# Patient Record
Sex: Female | Born: 1950
Health system: Southern US, Community
[De-identification: ages and names within clinical notes are randomized; demographics above are authoritative.]

## PROBLEM LIST (undated history)

## (undated) DIAGNOSIS — K5790 Diverticulosis of intestine, part unspecified, without perforation or abscess without bleeding: Secondary | ICD-10-CM

## (undated) DIAGNOSIS — J309 Allergic rhinitis, unspecified: Secondary | ICD-10-CM

## (undated) DIAGNOSIS — T7840XA Allergy, unspecified, initial encounter: Secondary | ICD-10-CM

## (undated) DIAGNOSIS — N946 Dysmenorrhea, unspecified: Secondary | ICD-10-CM

## (undated) DIAGNOSIS — J45909 Unspecified asthma, uncomplicated: Secondary | ICD-10-CM

## (undated) DIAGNOSIS — E7800 Pure hypercholesterolemia, unspecified: Secondary | ICD-10-CM

## (undated) DIAGNOSIS — C50919 Malignant neoplasm of unspecified site of unspecified female breast: Secondary | ICD-10-CM

## (undated) HISTORY — DX: Pure hypercholesterolemia, unspecified: E78.00

## (undated) HISTORY — DX: Malignant neoplasm of unspecified site of unspecified female breast: C50.919

## (undated) HISTORY — DX: Allergy, unspecified, initial encounter: T78.40XA

## (undated) HISTORY — DX: Diverticulosis of intestine, part unspecified, without perforation or abscess without bleeding: K57.90

## (undated) HISTORY — DX: Dysmenorrhea, unspecified: N94.6

---

## 2001-07-01 HISTORY — PX: NASAL SINUS SURGERY: SHX719

## 2004-06-18 ENCOUNTER — Emergency Department: Payer: Self-pay | Admitting: Emergency Medicine

## 2004-12-31 ENCOUNTER — Ambulatory Visit: Payer: Self-pay | Admitting: General Practice

## 2005-09-17 ENCOUNTER — Ambulatory Visit: Payer: Self-pay | Admitting: Unknown Physician Specialty

## 2005-09-17 LAB — HM COLONOSCOPY

## 2006-01-06 ENCOUNTER — Ambulatory Visit: Payer: Self-pay | Admitting: Internal Medicine

## 2007-02-07 ENCOUNTER — Ambulatory Visit: Payer: Self-pay | Admitting: Internal Medicine

## 2007-06-20 ENCOUNTER — Ambulatory Visit: Payer: Self-pay | Admitting: Internal Medicine

## 2008-02-20 ENCOUNTER — Ambulatory Visit: Payer: Self-pay | Admitting: Pediatrics

## 2008-10-01 ENCOUNTER — Emergency Department: Payer: Self-pay | Admitting: Emergency Medicine

## 2009-05-03 HISTORY — PX: BREAST SURGERY: SHX581

## 2009-11-10 ENCOUNTER — Ambulatory Visit: Payer: Self-pay | Admitting: Internal Medicine

## 2009-11-13 ENCOUNTER — Ambulatory Visit: Payer: Self-pay | Admitting: Internal Medicine

## 2009-12-12 ENCOUNTER — Ambulatory Visit: Payer: Self-pay | Admitting: Surgery

## 2009-12-22 ENCOUNTER — Ambulatory Visit: Payer: Self-pay | Admitting: Surgery

## 2010-01-01 ENCOUNTER — Ambulatory Visit: Payer: Self-pay | Admitting: Internal Medicine

## 2010-01-07 ENCOUNTER — Ambulatory Visit: Payer: Self-pay | Admitting: Internal Medicine

## 2010-01-13 LAB — CANCER ANTIGEN 27.29: CA 27.29: 14.7 U/mL (ref 0.0–38.6)

## 2010-01-15 ENCOUNTER — Ambulatory Visit: Payer: Self-pay | Admitting: Surgery

## 2010-01-31 ENCOUNTER — Ambulatory Visit: Payer: Self-pay | Admitting: Internal Medicine

## 2010-03-03 ENCOUNTER — Ambulatory Visit: Payer: Self-pay | Admitting: Internal Medicine

## 2010-04-02 ENCOUNTER — Ambulatory Visit: Payer: Self-pay | Admitting: Internal Medicine

## 2010-05-03 ENCOUNTER — Ambulatory Visit: Payer: Self-pay | Admitting: Internal Medicine

## 2010-05-03 DIAGNOSIS — C50919 Malignant neoplasm of unspecified site of unspecified female breast: Secondary | ICD-10-CM

## 2010-05-03 HISTORY — DX: Malignant neoplasm of unspecified site of unspecified female breast: C50.919

## 2010-05-03 HISTORY — PX: BREAST BIOPSY: SHX20

## 2010-05-03 HISTORY — PX: MASTECTOMY: SHX3

## 2010-06-03 ENCOUNTER — Ambulatory Visit: Payer: Self-pay | Admitting: Internal Medicine

## 2010-07-02 ENCOUNTER — Ambulatory Visit: Payer: Self-pay | Admitting: Internal Medicine

## 2010-08-02 ENCOUNTER — Ambulatory Visit: Payer: Self-pay | Admitting: Internal Medicine

## 2010-09-01 ENCOUNTER — Ambulatory Visit: Payer: Self-pay | Admitting: Internal Medicine

## 2010-10-02 ENCOUNTER — Ambulatory Visit: Payer: Self-pay | Admitting: Internal Medicine

## 2010-10-12 LAB — HM PAP SMEAR: HM Pap smear: NEGATIVE

## 2010-11-01 ENCOUNTER — Ambulatory Visit: Payer: Self-pay | Admitting: Internal Medicine

## 2010-11-12 ENCOUNTER — Ambulatory Visit: Payer: Self-pay | Admitting: Surgery

## 2010-11-17 ENCOUNTER — Ambulatory Visit: Payer: Self-pay | Admitting: Surgery

## 2010-12-02 ENCOUNTER — Ambulatory Visit: Payer: Self-pay | Admitting: Internal Medicine

## 2010-12-21 ENCOUNTER — Ambulatory Visit: Payer: Self-pay | Admitting: Surgery

## 2010-12-22 LAB — PATHOLOGY REPORT

## 2011-01-01 ENCOUNTER — Ambulatory Visit: Payer: Self-pay | Admitting: Surgery

## 2011-01-05 ENCOUNTER — Ambulatory Visit: Payer: Self-pay | Admitting: Internal Medicine

## 2011-01-20 ENCOUNTER — Ambulatory Visit: Payer: Self-pay | Admitting: Surgery

## 2011-02-01 ENCOUNTER — Ambulatory Visit: Payer: Self-pay | Admitting: Internal Medicine

## 2011-02-02 ENCOUNTER — Ambulatory Visit: Payer: Self-pay | Admitting: Surgery

## 2011-02-04 ENCOUNTER — Ambulatory Visit: Payer: Self-pay | Admitting: Internal Medicine

## 2011-03-04 ENCOUNTER — Ambulatory Visit: Payer: Self-pay | Admitting: Internal Medicine

## 2011-03-17 ENCOUNTER — Encounter: Payer: Self-pay | Admitting: Internal Medicine

## 2011-04-03 ENCOUNTER — Ambulatory Visit: Payer: Self-pay | Admitting: Internal Medicine

## 2011-04-03 ENCOUNTER — Encounter: Payer: Self-pay | Admitting: Internal Medicine

## 2011-05-04 ENCOUNTER — Encounter: Payer: Self-pay | Admitting: Internal Medicine

## 2011-05-11 ENCOUNTER — Ambulatory Visit: Payer: Self-pay | Admitting: Internal Medicine

## 2011-06-04 ENCOUNTER — Ambulatory Visit: Payer: Self-pay | Admitting: Internal Medicine

## 2011-08-03 ENCOUNTER — Ambulatory Visit: Payer: Self-pay | Admitting: Internal Medicine

## 2011-08-03 LAB — CBC CANCER CENTER
Basophil #: 0 x10 3/mm (ref 0.0–0.1)
Basophil %: 0.6 %
Eosinophil %: 2.2 %
HCT: 35.6 % (ref 35.0–47.0)
Lymphocyte #: 1.3 x10 3/mm (ref 1.0–3.6)
MCH: 31 pg (ref 26.0–34.0)
MCV: 90 fL (ref 80–100)
Monocyte #: 0.4 x10 3/mm (ref 0.0–0.7)
Neutrophil #: 2.2 x10 3/mm (ref 1.4–6.5)
Neutrophil %: 55 %
Platelet: 199 x10 3/mm (ref 150–440)
RBC: 3.97 10*6/uL (ref 3.80–5.20)
RDW: 12.9 % (ref 11.5–14.5)

## 2011-08-03 LAB — CREATININE, SERUM
Creatinine: 1.04 mg/dL (ref 0.60–1.30)
EGFR (African American): >60
EGFR (Non-African Amer.): 57 — ABNORMAL LOW

## 2011-08-03 LAB — HEPATIC FUNCTION PANEL A (ARMC)
Alkaline Phosphatase: 93 U/L (ref 50–136)
Bilirubin, Direct: 0.1 mg/dL (ref 0.00–0.20)
SGOT(AST): 29 U/L (ref 15–37)
Total Protein: 7.5 g/dL (ref 6.4–8.2)

## 2011-09-01 ENCOUNTER — Ambulatory Visit: Payer: Self-pay | Admitting: Internal Medicine

## 2011-10-04 ENCOUNTER — Ambulatory Visit: Payer: Self-pay | Admitting: Internal Medicine

## 2011-11-03 LAB — BASIC METABOLIC PANEL: Glucose: 95 mg/dL

## 2011-11-03 LAB — LIPID PANEL: Cholesterol: 192 mg/dL (ref 0–200)

## 2011-11-03 LAB — TSH: TSH: 2.99 u[IU]/mL (ref 0.41–5.90)

## 2012-01-18 ENCOUNTER — Ambulatory Visit: Payer: Self-pay

## 2012-01-20 ENCOUNTER — Ambulatory Visit: Payer: Self-pay

## 2012-02-01 ENCOUNTER — Ambulatory Visit: Payer: Self-pay | Admitting: Internal Medicine

## 2012-03-03 ENCOUNTER — Ambulatory Visit: Payer: Self-pay | Admitting: Internal Medicine

## 2012-04-02 ENCOUNTER — Ambulatory Visit: Payer: Self-pay | Admitting: Internal Medicine

## 2012-04-17 LAB — CBC CANCER CENTER
Basophil #: 0 x10 3/mm (ref 0.0–0.1)
Basophil %: 0.7 %
Eosinophil #: 0.2 x10 3/mm (ref 0.0–0.7)
Eosinophil %: 4.6 %
HCT: 37.1 % (ref 35.0–47.0)
HGB: 12.7 g/dL (ref 12.0–16.0)
Lymphocyte #: 1.7 x10 3/mm (ref 1.0–3.6)
MCH: 31 pg (ref 26.0–34.0)
MCHC: 34.3 g/dL (ref 32.0–36.0)
MCV: 91 fL (ref 80–100)
Monocyte #: 0.4 x10 3/mm (ref 0.2–0.9)
Neutrophil #: 2.8 x10 3/mm (ref 1.4–6.5)
RBC: 4.1 10*6/uL (ref 3.80–5.20)
RDW: 13 % (ref 11.5–14.5)

## 2012-04-17 LAB — HEPATIC FUNCTION PANEL A (ARMC)
Bilirubin, Direct: 0.1 mg/dL (ref 0.00–0.20)
SGOT(AST): 24 U/L (ref 15–37)
SGPT (ALT): 34 U/L (ref 12–78)
Total Protein: 7.4 g/dL (ref 6.4–8.2)

## 2012-04-17 LAB — CREATININE, SERUM
Creatinine: 0.95 mg/dL (ref 0.60–1.30)
EGFR (Non-African Amer.): >60

## 2012-05-03 ENCOUNTER — Ambulatory Visit: Payer: Self-pay | Admitting: Internal Medicine

## 2012-06-06 ENCOUNTER — Ambulatory Visit: Payer: Self-pay | Admitting: Internal Medicine

## 2012-07-01 ENCOUNTER — Ambulatory Visit: Payer: Self-pay | Admitting: Internal Medicine

## 2012-07-20 ENCOUNTER — Ambulatory Visit: Payer: Self-pay

## 2012-08-04 ENCOUNTER — Ambulatory Visit: Payer: Self-pay | Admitting: Internal Medicine

## 2012-08-07 ENCOUNTER — Telehealth: Payer: Self-pay | Admitting: Internal Medicine

## 2012-08-07 NOTE — Telephone Encounter (Signed)
Patient having trouble with her knee. She does not know weather to go to the Oncologist or Orthopedic physician.

## 2012-08-07 NOTE — Telephone Encounter (Signed)
I can make appt with ortho if she desires.

## 2012-08-07 NOTE — Telephone Encounter (Signed)
Patient stated that when she bends and sometimes walks her knee hurts. There is some swelling on side and in the back of her knee. This has been happening for a few months.

## 2012-08-08 ENCOUNTER — Telehealth: Payer: Self-pay | Admitting: *Deleted

## 2012-08-08 NOTE — Telephone Encounter (Signed)
Pt called back and states that she would like to proceed with the referral to orthopedics.  States she had seen Dr. Malvin Johns at [facility] in the past.

## 2012-08-08 NOTE — Telephone Encounter (Signed)
I did not realize that I had not seen her here yet.  She is my pt from [facility].  I can see her as a new pt and work her in tomorrow at 12:00 or 08/21/12 at 11:15 (30 min).  I can't make a referral without seeing her.  Please notify her.  Thanks.

## 2012-08-08 NOTE — Telephone Encounter (Signed)
error 

## 2012-08-08 NOTE — Telephone Encounter (Signed)
Patient is agreeable to appointment for tomorrow at 12:00.

## 2012-08-09 ENCOUNTER — Ambulatory Visit: Payer: Self-pay | Admitting: Internal Medicine

## 2012-08-31 ENCOUNTER — Ambulatory Visit: Payer: Self-pay | Admitting: Internal Medicine

## 2012-10-02 ENCOUNTER — Ambulatory Visit (INDEPENDENT_AMBULATORY_CARE_PROVIDER_SITE_OTHER): Payer: Medicare Other | Admitting: Internal Medicine

## 2012-10-02 ENCOUNTER — Encounter: Payer: Self-pay | Admitting: Internal Medicine

## 2012-10-02 VITALS — BP 110/70 | HR 89 | Temp 98.2°F | Ht 65.0 in | Wt 221.8 lb

## 2012-10-02 DIAGNOSIS — K219 Gastro-esophageal reflux disease without esophagitis: Secondary | ICD-10-CM | POA: Insufficient documentation

## 2012-10-02 DIAGNOSIS — C50919 Malignant neoplasm of unspecified site of unspecified female breast: Secondary | ICD-10-CM

## 2012-10-02 DIAGNOSIS — K5790 Diverticulosis of intestine, part unspecified, without perforation or abscess without bleeding: Secondary | ICD-10-CM

## 2012-10-02 DIAGNOSIS — E785 Hyperlipidemia, unspecified: Secondary | ICD-10-CM | POA: Insufficient documentation

## 2012-10-02 DIAGNOSIS — C50912 Malignant neoplasm of unspecified site of left female breast: Secondary | ICD-10-CM

## 2012-10-02 DIAGNOSIS — K5730 Diverticulosis of large intestine without perforation or abscess without bleeding: Secondary | ICD-10-CM

## 2012-10-02 DIAGNOSIS — Z9109 Other allergy status, other than to drugs and biological substances: Secondary | ICD-10-CM

## 2012-10-02 DIAGNOSIS — E7800 Pure hypercholesterolemia, unspecified: Secondary | ICD-10-CM

## 2012-10-02 NOTE — Assessment & Plan Note (Signed)
Bowels doing well.  Follow.   

## 2012-10-02 NOTE — Assessment & Plan Note (Signed)
Has reoccurring allergies and sinus problems.  Stable now.  Follow.

## 2012-10-02 NOTE — Assessment & Plan Note (Signed)
Low cholesterol diet and exercise.  Check lipid panel.   On Crestor.

## 2012-10-02 NOTE — Assessment & Plan Note (Signed)
Symptoms controlled.  Follow.   

## 2012-10-02 NOTE — Assessment & Plan Note (Signed)
Followed by Dr Sherrlyn Hock and Dr Katrinka Blazing.  Up to date. Has follow up with Dr Sherrlyn Hock 8/14.

## 2012-10-02 NOTE — Progress Notes (Signed)
  Subjective:    Patient ID: Robin Hill, female    DOB: 05-31-1950, 62 y.o.   MRN: [id number]  HPI 62 year old female with past history of hypercholesterolemia, reoccurring allergy and sinus problems and breast cancer.  She comes in today for a scheduled follow up and to transfer her care here to [facility].  Former pt of mine at [employer]. She has been having some increased pain in her right knee/leg.  Saw podiatry for her feel.  On Lyrica now.  Knee is better.  She made herself an appt with ortho.  Has an appt 10/16/12.  Her breathing is stable.  No significant sinus or allergy symptoms.  Has joined [employer].  Exercising.  Water aerobics.  No cardiac symptoms with increased activity or exertion.  No sob.  Bowels stable.    Past Medical History  Diagnosis Date  . Allergy   . Dysmenorrhea   . Hypercholesterolemia   . Breast cancer   . Diverticulosis     Review of Systems Patient denies any headache, lightheadedness or dizziness.  No sinus or allergy problems now.  No chest pain, tightness or palpitations.  No increased shortness of breath, cough or congestion.  No nausea or vomiting.  No acid reflux.  No abdominal pain or cramping.  No bowel change, such as diarrhea, constipation, BRBPR or melana.  No urine change.  Right knee/leg pain as outlined. Has improved.  Exercising.  Has noticed some discharge on her panties.  No bleeding.  No itching.       Objective:   Physical Exam. Filed Vitals:   10/02/12 1335  BP: 110/70  Pulse: 89  Temp: 98.2 F (54.9 C)   62 year old female in no acute distress.   HEENT:  Nares- clear.  Oropharynx - without lesions. NECK:  Supple.  Nontender.  No audible bruit.  HEART:  Appears to be regular. LUNGS:  No crackles or wheezing audible.  Respirations even and unlabored.  RADIAL PULSE:  Equal bilaterally. ABDOMEN:  Soft, nontender.  Bowel sounds present and normal.  No audible abdominal bruit.  GU:  Normal external genitalia.  Vaginal vault without  lesions.  Atrophy changes present.  Cervix identified.  Pap performed. Could not appreciate any adnexal masses or tenderness.   RECTAL:  Heme negative.   EXTREMITIES:  No increased edema present.  DP pulses palpable and equal bilaterally.          Assessment & Plan:  GYN.  Exam as outlined.  Pap today.  Follow.   PULMONARY.  Breathing stable.    HEALTH MAINTENANCE.  Pelvic/pap today.  Mammogram through Dr Sherrlyn Hock.  Colonoscopy 09/17/05 revealed internal hemorrhoids and diverticulosis.  Bone density 09/17/10 - normal.

## 2012-10-17 ENCOUNTER — Telehealth: Payer: Self-pay | Admitting: Internal Medicine

## 2012-10-17 ENCOUNTER — Other Ambulatory Visit: Payer: Self-pay | Admitting: *Deleted

## 2012-10-17 ENCOUNTER — Other Ambulatory Visit (INDEPENDENT_AMBULATORY_CARE_PROVIDER_SITE_OTHER): Payer: Medicare Other

## 2012-10-17 DIAGNOSIS — C50912 Malignant neoplasm of unspecified site of left female breast: Secondary | ICD-10-CM

## 2012-10-17 DIAGNOSIS — E7800 Pure hypercholesterolemia, unspecified: Secondary | ICD-10-CM

## 2012-10-17 DIAGNOSIS — C50919 Malignant neoplasm of unspecified site of unspecified female breast: Secondary | ICD-10-CM

## 2012-10-17 LAB — TSH: TSH: 0.69 u[IU]/mL (ref 0.35–5.50)

## 2012-10-17 LAB — COMPREHENSIVE METABOLIC PANEL
ALT: 27 U/L (ref 0–35)
Albumin: 4.2 g/dL (ref 3.5–5.2)
CO2: 22 mEq/L (ref 19–32)
Chloride: 109 mEq/L (ref 96–112)
GFR: 108.82 mL/min (ref >60.00)
Potassium: 4.1 mEq/L (ref 3.5–5.1)
Sodium: 142 mEq/L (ref 135–145)
Total Bilirubin: 0.2 mg/dL — ABNORMAL LOW (ref 0.3–1.2)
Total Protein: 7.8 g/dL (ref 6.0–8.3)

## 2012-10-17 LAB — LIPID PANEL: VLDL: 12.2 mg/dL (ref 0.0–40.0)

## 2012-10-17 MED ORDER — OMEPRAZOLE 20 MG PO CPDR: 20.0000 mg | DELAYED_RELEASE_CAPSULE | Freq: Every day | ORAL | Status: DC

## 2012-10-17 NOTE — Telephone Encounter (Signed)
omeprazole (PRILOSEC) 20 MG capsule

## 2012-10-17 NOTE — Telephone Encounter (Signed)
Done & left message on pt voicemail to notify her of refill

## 2012-10-19 ENCOUNTER — Encounter: Payer: Self-pay | Admitting: *Deleted

## 2012-10-19 ENCOUNTER — Other Ambulatory Visit: Payer: Self-pay | Admitting: Internal Medicine

## 2012-10-19 DIAGNOSIS — R739 Hyperglycemia, unspecified: Secondary | ICD-10-CM

## 2012-10-19 NOTE — Progress Notes (Signed)
Orders placed for f/u glucose and a1c.

## 2012-11-09 ENCOUNTER — Other Ambulatory Visit (INDEPENDENT_AMBULATORY_CARE_PROVIDER_SITE_OTHER): Payer: Medicare Other

## 2012-11-09 DIAGNOSIS — R7309 Other abnormal glucose: Secondary | ICD-10-CM

## 2012-11-09 DIAGNOSIS — R739 Hyperglycemia, unspecified: Secondary | ICD-10-CM

## 2012-11-09 LAB — GLUCOSE, RANDOM: Glucose, Bld: 93 mg/dL (ref 70–99)

## 2012-11-09 LAB — HEMOGLOBIN A1C: Hgb A1c MFr Bld: 6.2 % (ref 4.6–6.5)

## 2012-11-10 ENCOUNTER — Encounter: Payer: Self-pay | Admitting: *Deleted

## 2012-11-14 ENCOUNTER — Telehealth: Payer: Self-pay | Admitting: Internal Medicine

## 2012-11-14 NOTE — Telephone Encounter (Signed)
Pt states that she needs [place] to fill out the form for the Crestor & she will have the cancer doctor sign for the Arimidex

## 2012-11-14 NOTE — Telephone Encounter (Signed)
Pt states she dropped off paperwork on Thursday  7/10 for Dr. Lorin Picket to sign regarding medication.  Pt states she needs this back.  Please call pt when this is ready for pick up.

## 2012-11-14 NOTE — Telephone Encounter (Signed)
LMTCB-need additional information to complete form

## 2012-11-16 NOTE — Telephone Encounter (Signed)
Signed and in your box.

## 2012-11-16 NOTE — Telephone Encounter (Signed)
Pt notified & form faxed to pt @ [phone]

## 2012-12-11 ENCOUNTER — Ambulatory Visit: Payer: Self-pay | Admitting: Internal Medicine

## 2012-12-18 LAB — CBC CANCER CENTER
Basophil #: 0 x10 3/mm (ref 0.0–0.1)
HCT: 36.6 % (ref 35.0–47.0)
HGB: 12.7 g/dL (ref 12.0–16.0)
Lymphocyte #: 1.5 x10 3/mm (ref 1.0–3.6)
MCH: 31.3 pg (ref 26.0–34.0)
MCHC: 34.6 g/dL (ref 32.0–36.0)
Monocyte %: 5.3 %
Platelet: 162 x10 3/mm (ref 150–440)
RBC: 4.05 10*6/uL (ref 3.80–5.20)
RDW: 12.9 % (ref 11.5–14.5)

## 2012-12-18 LAB — HEPATIC FUNCTION PANEL A (ARMC)
Albumin: 3.7 g/dL (ref 3.4–5.0)
Alkaline Phosphatase: 91 U/L (ref 50–136)
Bilirubin, Direct: 0.1 mg/dL (ref 0.00–0.20)
Bilirubin,Total: 0.6 mg/dL (ref 0.2–1.0)

## 2012-12-18 LAB — CREATININE, SERUM
EGFR (African American): >60
EGFR (Non-African Amer.): 58 — ABNORMAL LOW

## 2013-01-01 ENCOUNTER — Ambulatory Visit: Payer: Self-pay | Admitting: Internal Medicine

## 2013-02-01 ENCOUNTER — Ambulatory Visit: Payer: Medicare Other | Admitting: Internal Medicine

## 2013-03-06 ENCOUNTER — Ambulatory Visit: Payer: Self-pay | Admitting: Internal Medicine

## 2013-03-13 ENCOUNTER — Encounter: Payer: Self-pay | Admitting: Internal Medicine

## 2013-03-13 ENCOUNTER — Encounter (INDEPENDENT_AMBULATORY_CARE_PROVIDER_SITE_OTHER): Payer: Self-pay

## 2013-03-13 ENCOUNTER — Ambulatory Visit (INDEPENDENT_AMBULATORY_CARE_PROVIDER_SITE_OTHER): Payer: Medicare Other | Admitting: Internal Medicine

## 2013-03-13 VITALS — BP 130/70 | HR 84 | Temp 98.1°F | Ht 65.0 in | Wt 217.5 lb

## 2013-03-13 DIAGNOSIS — Z9109 Other allergy status, other than to drugs and biological substances: Secondary | ICD-10-CM

## 2013-03-13 DIAGNOSIS — K5790 Diverticulosis of intestine, part unspecified, without perforation or abscess without bleeding: Secondary | ICD-10-CM

## 2013-03-13 DIAGNOSIS — Z780 Asymptomatic menopausal state: Secondary | ICD-10-CM

## 2013-03-13 DIAGNOSIS — C50912 Malignant neoplasm of unspecified site of left female breast: Secondary | ICD-10-CM

## 2013-03-13 DIAGNOSIS — E7800 Pure hypercholesterolemia, unspecified: Secondary | ICD-10-CM

## 2013-03-13 DIAGNOSIS — C50919 Malignant neoplasm of unspecified site of unspecified female breast: Secondary | ICD-10-CM

## 2013-03-13 DIAGNOSIS — K219 Gastro-esophageal reflux disease without esophagitis: Secondary | ICD-10-CM

## 2013-03-13 DIAGNOSIS — K5730 Diverticulosis of large intestine without perforation or abscess without bleeding: Secondary | ICD-10-CM

## 2013-03-13 MED ORDER — MONTELUKAST SODIUM 10 MG PO TABS: 10.0000 mg | ORAL_TABLET | Freq: Every day | ORAL | Status: DC

## 2013-03-13 MED ORDER — FLUTICASONE PROPIONATE 50 MCG/ACT NA SUSP: 2.0000 | Freq: Every day | NASAL | Status: DC

## 2013-03-13 MED ORDER — BUDESONIDE-FORMOTEROL FUMARATE 160-4.5 MCG/ACT IN AERO: 2.0000 | INHALATION_SPRAY | Freq: Two times a day (BID) | RESPIRATORY_TRACT | Status: DC

## 2013-03-13 NOTE — Progress Notes (Signed)
Pre-visit discussion using our clinic review tool. No additional management support is needed unless otherwise documented below in the visit note.  

## 2013-03-13 NOTE — Progress Notes (Signed)
Subjective:    Patient ID: Robin Hill, female    DOB: [birth year], 62 y.o.   MRN: [id number]  HPI 62 year old female with past history of hypercholesterolemia, reoccurring allergy and sinus problems and breast cancer.  She comes in today for a scheduled follow up.   Her breathing is stable.  No significant sinus or allergy symptoms.  Has joined [employer].  Exercising.  Water aerobics.  No cardiac symptoms with increased activity or exertion.  No sob.  Bowels stable.  She is on Lyrica for her feet.  She started feeling a lightning sensation on her face.  She decreased her dose of lyrica and the sensation resolved.     Past Medical History  Diagnosis Date  . Allergy   . Dysmenorrhea   . Hypercholesterolemia   . Breast cancer   . Diverticulosis     Current Outpatient Prescriptions on File Prior to Visit  Medication Sig Dispense Refill  . Albuterol Sulfate (PROAIR HFA IN) 90 mcg. 2 puffs using inhaler four times a day as needed.      Marland Kitchen anastrozole (ARIMIDEX) 1 MG tablet Take 1 mg by mouth daily.      Marland Kitchen azelastine (ASTELIN) 137 MCG/SPRAY nasal spray 137 mcg. Spray 2 sprays into both nostrils twice a day      . Azelastine HCl (ASTEPRO) 0.15 % SOLN Spray 2 sprays into both nostrils once a day as needed (For allergies)      . b complex vitamins capsule Take 1 capsule by mouth daily.      . Calcium Carb-Cholecalciferol (CALCIUM 600/VITAMIN D3) 600-800 MG-UNIT TABS Take by mouth daily.      . calcium carbonate (TUMS - DOSED IN MG ELEMENTAL CALCIUM) 500 MG chewable tablet Chew 1 tablet by mouth daily.      . cetirizine (ZYRTEC) 10 MG tablet Take 10 mg by mouth daily.      . cholecalciferol (VITAMIN D) 400 UNITS TABS Take 400 Units by mouth daily. Chewable      . Cyanocobalamin (VITAMIN B12 PO) Take 500 mcg by mouth daily. Administer 1 tablet by mouth once a day      . HYDROcodone-acetaminophen (NORCO/VICODIN) 5-325 MG per tablet Take 1 tablet every four to six hours      . levalbuterol (XOPENEX  HFA) 45 MCG/ACT inhaler Inhale 2 puffs four times a day as needed      . loratadine (CLARITIN) 10 MG tablet Take 1 tablet by mouth once a day as needed      . pregabalin (LYRICA) 75 MG capsule Take 150 mg by mouth 2 (two) times daily.      . promethazine (PHENERGAN) 25 MG tablet Take 25 mg by mouth every 6 (six) hours as needed for nausea.      . rosuvastatin (CRESTOR) 10 MG tablet Take 10 mg by mouth daily.      . sodium chloride (OCEAN) 0.65 % nasal spray Place 1 spray into the nose as needed for congestion.      . vitamin E 400 UNIT capsule Take 400 Units by mouth daily.       No current facility-administered medications on file prior to visit.     Review of Systems Patient denies any headache, lightheadedness or dizziness.  The lightning sensation resolved.  No sinus or allergy problems now.  No chest pain, tightness or palpitations.  No increased shortness of breath, cough or congestion.  No nausea or vomiting.  No acid reflux.  No abdominal pain  or cramping.  No bowel change, such as diarrhea, constipation, BRBPR or melana.  No urine change.  No vaginal problems reported.       Objective:   Physical Exam . Filed Vitals:   03/13/13 1504  BP: 130/70  Pulse: 84  Temp: 98.1 F (72.37 C)   62 year old female in no acute distress.   HEENT:  Nares- clear.  Oropharynx - without lesions. NECK:  Supple.  Nontender.  No audible bruit.  HEART:  Appears to be regular. LUNGS:  No crackles or wheezing audible.  Respirations even and unlabored.  RADIAL PULSE:  Equal bilaterally. ABDOMEN:  Soft, nontender.  Bowel sounds present and normal.  No audible abdominal bruit.    EXTREMITIES:  No increased edema present.  DP pulses palpable and equal bilaterally.          Assessment & Plan:  GYN.  No problems reported today.    PULMONARY.  Breathing stable.   NEED FOR BONE DENSITY.  On arimidex.  Schedule a f/u bone density.     HEALTH MAINTENANCE.   Mammogram through Dr Sherrlyn Hock.  Colonoscopy  09/17/05 revealed internal hemorrhoids and diverticulosis.  Bone density 09/17/10 - normal.

## 2013-03-17 ENCOUNTER — Encounter: Payer: Self-pay | Admitting: Internal Medicine

## 2013-03-17 ENCOUNTER — Telehealth: Payer: Self-pay | Admitting: Internal Medicine

## 2013-03-17 DIAGNOSIS — C50912 Malignant neoplasm of unspecified site of left female breast: Secondary | ICD-10-CM

## 2013-03-17 DIAGNOSIS — E7800 Pure hypercholesterolemia, unspecified: Secondary | ICD-10-CM

## 2013-03-17 DIAGNOSIS — R739 Hyperglycemia, unspecified: Secondary | ICD-10-CM

## 2013-03-17 NOTE — Assessment & Plan Note (Signed)
Followed by Dr Sherrlyn Hock and Dr Katrinka Blazing.  Up to date.

## 2013-03-17 NOTE — Assessment & Plan Note (Signed)
Symptoms controlled.  Follow.   

## 2013-03-17 NOTE — Assessment & Plan Note (Signed)
Bowels doing well.  Follow.   

## 2013-03-17 NOTE — Assessment & Plan Note (Signed)
Has reoccurring allergies and sinus problems.  Stable now.  Follow.  Take singulair, flonase and zyrtec regularly.    

## 2013-03-17 NOTE — Assessment & Plan Note (Signed)
Low cholesterol diet and exercise.  Follow lipid panel.   On Crestor.   

## 2013-03-17 NOTE — Telephone Encounter (Signed)
Please schedule fasting labs 1-2 days before her scheduled f/u appt.  Also add to her next appt (do pelvic/pap).

## 2013-03-19 NOTE — Telephone Encounter (Signed)
The patient 's lab appointment has been rescheduled 2 days before her follow up visit.

## 2013-03-19 NOTE — Telephone Encounter (Signed)
Left message for pt to call office

## 2013-03-23 ENCOUNTER — Other Ambulatory Visit: Payer: Medicare Other

## 2013-04-02 ENCOUNTER — Ambulatory Visit (INDEPENDENT_AMBULATORY_CARE_PROVIDER_SITE_OTHER): Payer: Medicare Other | Admitting: Adult Health

## 2013-04-02 ENCOUNTER — Encounter: Payer: Self-pay | Admitting: Adult Health

## 2013-04-02 ENCOUNTER — Ambulatory Visit: Payer: Self-pay | Admitting: Internal Medicine

## 2013-04-02 VITALS — BP 128/80 | HR 84 | Temp 98.1°F | Wt 217.0 lb

## 2013-04-02 DIAGNOSIS — J040 Acute laryngitis: Secondary | ICD-10-CM | POA: Insufficient documentation

## 2013-04-02 MED ORDER — GUAIFENESIN-CODEINE 100-10 MG/5ML PO SOLN: 5.0000 mL | Freq: Three times a day (TID) | ORAL | Status: DC | PRN

## 2013-04-02 MED ORDER — AZITHROMYCIN 250 MG PO TABS: ORAL_TABLET | ORAL | Status: DC

## 2013-04-02 NOTE — Progress Notes (Signed)
Pre visit review using our clinic review tool, if applicable. No additional management support is needed unless otherwise documented below in the visit note. 

## 2013-04-02 NOTE — Assessment & Plan Note (Signed)
Laryngitis and cough. Start Robitussin AC for cough. Azithromycin. RTC if symptoms do not improve within 3-4 days.

## 2013-04-02 NOTE — Patient Instructions (Addendum)
Laryngitis  Laryngitis is redness, soreness, and puffiness (inflammation) of the vocal cords. It causes hoarseness, cough, loss of voice, sore throat, and dry throat. It may be caused by:  · Infection.  · Too much smoking.  · Too much talking or yelling.  · Breathing in of toxic fumes.  · Allergies.  · A backup of acid from your stomach.   HOME CARE  · Drink enough fluids to keep your pee (urine) clear or pale yellow.  · Rest until you no longer have problems or as told by your doctor.  · Breathe in moist air.  · Take all medicine as told by your doctor.  · Do not smoke.  · Talk as little as possible (this includes whispering).  · Write on paper instead of talking until your voice is back to normal.  · Follow up with your doctor if you have not improved after 10 days.  GET HELP IF:   · You have trouble breathing.  · You cough up blood.  · You have a fever that will not go away.  · You have increasing pain.  · You have trouble swallowing.  MAKE SURE YOU:  · Understand these instructions.  · Will watch your condition.  · Will get help right away if you are not doing well or get worse.  Document Released: 04/08/2011 Document Revised: 07/12/2011 Document Reviewed: 04/08/2011  [employer]® Patient Information ©2014 [employer].

## 2013-04-02 NOTE — Progress Notes (Signed)
Subjective:    Patient ID: Robin Hill, female    DOB: 1951-02-14, 62 y.o.   MRN: [id number]  HPI  Patient is a pleasant 62 y/o female who presents to clinic with hoarseness. Started on Thursday with a sore throat and cough. She gargled with salt water which helped. She reports having phlegm that she needs to get rid of. Now hoarseness developed. No fever or chills.  Current Outpatient Prescriptions on File Prior to Visit  Medication Sig Dispense Refill  . Albuterol Sulfate (PROAIR HFA IN) 90 mcg. 2 puffs using inhaler four times a day as needed.      Marland Kitchen anastrozole (ARIMIDEX) 1 MG tablet Take 1 mg by mouth daily.      Marland Kitchen azelastine (ASTELIN) 137 MCG/SPRAY nasal spray 137 mcg. Spray 2 sprays into both nostrils twice a day      . Azelastine HCl (ASTEPRO) 0.15 % SOLN Spray 2 sprays into both nostrils once a day as needed (For allergies)      . b complex vitamins capsule Take 1 capsule by mouth daily.      . budesonide-formoterol (SYMBICORT) 160-4.5 MCG/ACT inhaler Inhale 2 puffs into the lungs 2 (two) times daily.  1 Inhaler  4  . Calcium Carb-Cholecalciferol (CALCIUM 600/VITAMIN D3) 600-800 MG-UNIT TABS Take by mouth daily.      . calcium carbonate (TUMS - DOSED IN MG ELEMENTAL CALCIUM) 500 MG chewable tablet Chew 1 tablet by mouth daily.      . cetirizine (ZYRTEC) 10 MG tablet Take 10 mg by mouth daily.      . cholecalciferol (VITAMIN D) 400 UNITS TABS Take 400 Units by mouth daily. Chewable      . Cyanocobalamin (VITAMIN B12 PO) Take 500 mcg by mouth daily. Administer 1 tablet by mouth once a day      . fluticasone (FLONASE) 50 MCG/ACT nasal spray Place 2 sprays into both nostrils daily. 1 spray into both nostrils twice a day  16 g  5  . HYDROcodone-acetaminophen (NORCO/VICODIN) 5-325 MG per tablet Take 1 tablet every four to six hours      . levalbuterol (XOPENEX HFA) 45 MCG/ACT inhaler Inhale 2 puffs four times a day as needed      . loratadine (CLARITIN) 10 MG tablet Take 1 tablet by  mouth once a day as needed      . montelukast (SINGULAIR) 10 MG tablet Take 1 tablet (10 mg total) by mouth at bedtime. 1 tablet once a day  30 tablet  5  . pregabalin (LYRICA) 75 MG capsule Take 150 mg by mouth 2 (two) times daily.      . promethazine (PHENERGAN) 25 MG tablet Take 25 mg by mouth every 6 (six) hours as needed for nausea.      . rosuvastatin (CRESTOR) 10 MG tablet Take 10 mg by mouth daily.      . sodium chloride (OCEAN) 0.65 % nasal spray Place 1 spray into the nose as needed for congestion.      . vitamin E 400 UNIT capsule Take 400 Units by mouth daily.       No current facility-administered medications on file prior to visit.    Review of Systems Positive for hoarseness, cough. Negative for fever, chills, malaise    Objective:   Physical Exam  Constitutional: She is oriented to person, place, and time. She appears well-developed and well-nourished. No distress.  HENT:  Head: Normocephalic and atraumatic.  Pharyngeal erythema with noted drainage  Cardiovascular: Normal  rate, regular rhythm and normal heart sounds.  Exam reveals no gallop.   No murmur heard. Pulmonary/Chest: Effort normal and breath sounds normal. No respiratory distress. She has no wheezes. She has no rales.  Lymphadenopathy:    She has no cervical adenopathy.  Neurological: She is alert and oriented to person, place, and time.  Skin: Skin is warm and dry.  Psychiatric: She has a normal mood and affect. Her behavior is normal. Judgment and thought content normal.          Assessment & Plan:

## 2013-04-05 ENCOUNTER — Encounter: Payer: Self-pay | Admitting: *Deleted

## 2013-05-09 ENCOUNTER — Other Ambulatory Visit (INDEPENDENT_AMBULATORY_CARE_PROVIDER_SITE_OTHER): Payer: Medicare HMO

## 2013-05-09 DIAGNOSIS — R7309 Other abnormal glucose: Secondary | ICD-10-CM

## 2013-05-09 DIAGNOSIS — R739 Hyperglycemia, unspecified: Secondary | ICD-10-CM

## 2013-05-09 DIAGNOSIS — C50919 Malignant neoplasm of unspecified site of unspecified female breast: Secondary | ICD-10-CM

## 2013-05-09 DIAGNOSIS — E7800 Pure hypercholesterolemia, unspecified: Secondary | ICD-10-CM

## 2013-05-09 DIAGNOSIS — C50912 Malignant neoplasm of unspecified site of left female breast: Secondary | ICD-10-CM

## 2013-05-09 LAB — LIPID PANEL
Cholesterol: 210 mg/dL — ABNORMAL HIGH (ref 0–200)
HDL: 69.2 mg/dL (ref >39.00)
Total CHOL/HDL Ratio: 3
Triglycerides: 66 mg/dL (ref 0.0–149.0)
VLDL: 13.2 mg/dL (ref 0.0–40.0)

## 2013-05-09 LAB — BASIC METABOLIC PANEL
BUN: 14 mg/dL (ref 6–23)
CO2: 30 mEq/L (ref 19–32)
Calcium: 9.5 mg/dL (ref 8.4–10.5)
Chloride: 105 mEq/L (ref 96–112)
Creatinine, Ser: 0.9 mg/dL (ref 0.4–1.2)
GFR: 80.25 mL/min (ref >60.00)
GLUCOSE: 107 mg/dL — AB (ref 70–99)
Potassium: 4.1 mEq/L (ref 3.5–5.1)
Sodium: 141 mEq/L (ref 135–145)

## 2013-05-09 LAB — HEPATIC FUNCTION PANEL
ALK PHOS: 67 U/L (ref 39–117)
ALT: 24 U/L (ref 0–35)
AST: 27 U/L (ref 0–37)
Albumin: 4 g/dL (ref 3.5–5.2)
BILIRUBIN DIRECT: 0.1 mg/dL (ref 0.0–0.3)
Total Bilirubin: 0.7 mg/dL (ref 0.3–1.2)
Total Protein: 7 g/dL (ref 6.0–8.3)

## 2013-05-09 LAB — HEMOGLOBIN A1C: Hgb A1c MFr Bld: 6.1 % (ref 4.6–6.5)

## 2013-05-09 LAB — MICROALBUMIN / CREATININE URINE RATIO
Creatinine,U: 164.4 mg/dL
MICROALB UR: 0.2 mg/dL (ref 0.0–1.9)
Microalb Creat Ratio: 0.1 mg/g (ref 0.0–30.0)

## 2013-05-09 LAB — LDL CHOLESTEROL, DIRECT: Direct LDL: 126.2 mg/dL

## 2013-05-14 ENCOUNTER — Encounter: Payer: Self-pay | Admitting: Internal Medicine

## 2013-05-14 ENCOUNTER — Ambulatory Visit (INDEPENDENT_AMBULATORY_CARE_PROVIDER_SITE_OTHER): Payer: Medicare HMO | Admitting: Internal Medicine

## 2013-05-14 ENCOUNTER — Other Ambulatory Visit (HOSPITAL_COMMUNITY)
Admission: RE | Admit: 2013-05-14 | Discharge: 2013-05-14 | Disposition: A | Discharge status: Home / Self Care | Payer: Medicare HMO | Source: Ambulatory Visit | Attending: Internal Medicine | Admitting: Internal Medicine

## 2013-05-14 VITALS — BP 110/60 | HR 88 | Temp 98.4°F | Wt 217.8 lb

## 2013-05-14 DIAGNOSIS — K219 Gastro-esophageal reflux disease without esophagitis: Secondary | ICD-10-CM

## 2013-05-14 DIAGNOSIS — C50919 Malignant neoplasm of unspecified site of unspecified female breast: Secondary | ICD-10-CM

## 2013-05-14 DIAGNOSIS — Z124 Encounter for screening for malignant neoplasm of cervix: Secondary | ICD-10-CM

## 2013-05-14 DIAGNOSIS — K5790 Diverticulosis of intestine, part unspecified, without perforation or abscess without bleeding: Secondary | ICD-10-CM

## 2013-05-14 DIAGNOSIS — Z9109 Other allergy status, other than to drugs and biological substances: Secondary | ICD-10-CM

## 2013-05-14 DIAGNOSIS — B351 Tinea unguium: Secondary | ICD-10-CM

## 2013-05-14 DIAGNOSIS — E7800 Pure hypercholesterolemia, unspecified: Secondary | ICD-10-CM

## 2013-05-14 DIAGNOSIS — M79675 Pain in left toe(s): Secondary | ICD-10-CM

## 2013-05-14 DIAGNOSIS — M79609 Pain in unspecified limb: Secondary | ICD-10-CM

## 2013-05-14 DIAGNOSIS — Z1151 Encounter for screening for human papillomavirus (HPV): Secondary | ICD-10-CM | POA: Insufficient documentation

## 2013-05-14 DIAGNOSIS — Z1211 Encounter for screening for malignant neoplasm of colon: Secondary | ICD-10-CM

## 2013-05-14 DIAGNOSIS — K5730 Diverticulosis of large intestine without perforation or abscess without bleeding: Secondary | ICD-10-CM

## 2013-05-16 ENCOUNTER — Encounter: Payer: Self-pay | Admitting: Internal Medicine

## 2013-05-16 DIAGNOSIS — B351 Tinea unguium: Secondary | ICD-10-CM | POA: Insufficient documentation

## 2013-05-16 NOTE — Assessment & Plan Note (Signed)
Low cholesterol diet and exercise.  Follow lipid panel.   On Crestor.   

## 2013-05-16 NOTE — Assessment & Plan Note (Signed)
Followed by Dr Ma Hillock and Dr Tamala Julian.  Up to date.  (mammo 09/29/12 ?).

## 2013-05-16 NOTE — Assessment & Plan Note (Signed)
Pain present as well (left > right).  Refer to Dr Milinda Pointer for evaluation.

## 2013-05-16 NOTE — Assessment & Plan Note (Signed)
Symptoms controlled.  Follow.   

## 2013-05-16 NOTE — Assessment & Plan Note (Signed)
Has reoccurring allergies and sinus problems.  Stable now.  Follow.  Take singulair, flonase and zyrtec regularly.    

## 2013-05-16 NOTE — Progress Notes (Signed)
Subjective:    Patient ID: Robin Hill, female    DOB: 1950-06-13, 63 y.o.   MRN: [id number]  HPI 63 year old female with past history of hypercholesterolemia, reoccurring allergy and sinus problems and breast cancer.  She comes in today for a scheduled follow up.   Her breathing is stable.  No significant sinus or allergy symptoms. Exercising.  Water aerobics.  No cardiac symptoms with increased activity or exertion.  No sob.  Bowels stable.  She is on Lyrica for her feet.  No abdominal pain or cramping.  Bowels stable.  Having some issues with her left great toe.  Fungus.  Some pain.     Past Medical History  Diagnosis Date  . Allergy   . Dysmenorrhea   . Hypercholesterolemia   . Breast cancer   . Diverticulosis     Current Outpatient Prescriptions on File Prior to Visit  Medication Sig Dispense Refill  . Albuterol Sulfate (PROAIR HFA IN) 90 mcg. 2 puffs using inhaler four times a day as needed.      Marland Kitchen anastrozole (ARIMIDEX) 1 MG tablet Take 1 mg by mouth daily.      Marland Kitchen azelastine (ASTELIN) 137 MCG/SPRAY nasal spray 137 mcg. Spray 2 sprays into both nostrils twice a day      . Azelastine HCl (ASTEPRO) 0.15 % SOLN Spray 2 sprays into both nostrils once a day as needed (For allergies)      . b complex vitamins capsule Take 1 capsule by mouth daily.      . budesonide-formoterol (SYMBICORT) 160-4.5 MCG/ACT inhaler Inhale 2 puffs into the lungs 2 (two) times daily.  1 Inhaler  4  . Calcium Carb-Cholecalciferol (CALCIUM 600/VITAMIN D3) 600-800 MG-UNIT TABS Take by mouth daily.      . calcium carbonate (TUMS - DOSED IN MG ELEMENTAL CALCIUM) 500 MG chewable tablet Chew 1 tablet by mouth daily.      . cetirizine (ZYRTEC) 10 MG tablet Take 10 mg by mouth daily.      . cholecalciferol (VITAMIN D) 400 UNITS TABS Take 400 Units by mouth daily. Chewable      . Cyanocobalamin (VITAMIN B12 PO) Take 500 mcg by mouth daily. Administer 1 tablet by mouth once a day      . fluticasone (FLONASE) 50  MCG/ACT nasal spray Place 2 sprays into both nostrils daily. 1 spray into both nostrils twice a day  16 g  5  . levalbuterol (XOPENEX HFA) 45 MCG/ACT inhaler Inhale 2 puffs four times a day as needed      . loratadine (CLARITIN) 10 MG tablet Take 1 tablet by mouth once a day as needed      . montelukast (SINGULAIR) 10 MG tablet Take 1 tablet (10 mg total) by mouth at bedtime. 1 tablet once a day  30 tablet  5  . pregabalin (LYRICA) 75 MG capsule Take 150 mg by mouth 2 (two) times daily.      . promethazine (PHENERGAN) 25 MG tablet Take 25 mg by mouth every 6 (six) hours as needed for nausea.      . rosuvastatin (CRESTOR) 10 MG tablet Take 10 mg by mouth daily.      . sodium chloride (OCEAN) 0.65 % nasal spray Place 1 spray into the nose as needed for congestion.      . vitamin E 400 UNIT capsule Take 400 Units by mouth daily.      Marland Kitchen guaiFENesin-codeine 100-10 MG/5ML syrup Take 5 mLs by mouth 3 (three)  times daily as needed.  120 mL  0   No current facility-administered medications on file prior to visit.     Review of Systems Patient denies any headache, lightheadedness or dizziness.  No sinus or allergy problems now.  No chest pain, tightness or palpitations.  No increased shortness of breath, cough or congestion.  No nausea or vomiting.  No acid reflux.  No abdominal pain or cramping.  No bowel change, such as diarrhea, constipation, BRBPR or melana.  No urine change.  No vaginal problems reported.  Toe pain (left > right).  Fungus present.       Objective:   Physical Exam . Filed Vitals:   05/14/13 1431  BP: 110/60  Pulse: 88  Temp: 98.4 F (36.9 C)   Blood pressure recheck:  58/71  63 year old female in no acute distress.   HEENT:  Nares- clear.  Oropharynx - without lesions. NECK:  Supple.  Nontender.  No audible bruit.  HEART:  Appears to be regular. LUNGS:  No crackles or wheezing audible.  Respirations even and unlabored.  RADIAL PULSE:  Equal bilaterally.    BREASTS:   No nipple discharge or nipple retraction present.  Could not appreciate any distinct nodules or axillary adenopathy.  ABDOMEN:  Soft, nontender.  Bowel sounds present and normal.  No audible abdominal bruit.  GU:  Normal external genitalia.  Vaginal vault without lesions.  Cervix identified.  Pap performed. Could not appreciate any adnexal masses or tenderness.  EXTREMITIES:  No increased edema present.  DP pulses palpable and equal bilaterally.   TOES:  Fungus present.  Increased pain to palpation (left great toe > right great toe).          Assessment & Plan:  GYN.  No problems reported today.  Pelvic/pap today.    PULMONARY.  Breathing stable.   NEED FOR BONE DENSITY.  On arimidex.  Bone density 03/26/13 - normal.     HEALTH MAINTENANCE.   Mammogram through Dr Ma Hillock.  Colonoscopy 09/17/05 revealed internal hemorrhoids and diverticulosis.  Bone density 03/26/13 - normal.

## 2013-05-16 NOTE — Assessment & Plan Note (Signed)
Bowels doing well.  Follow.   

## 2013-05-17 ENCOUNTER — Encounter: Payer: Self-pay | Admitting: *Deleted

## 2013-05-21 ENCOUNTER — Other Ambulatory Visit (INDEPENDENT_AMBULATORY_CARE_PROVIDER_SITE_OTHER): Payer: Medicare HMO

## 2013-05-21 DIAGNOSIS — Z1211 Encounter for screening for malignant neoplasm of colon: Secondary | ICD-10-CM

## 2013-05-22 LAB — FECAL OCCULT BLOOD, IMMUNOCHEMICAL: Fecal Occult Bld: NEGATIVE

## 2013-05-23 ENCOUNTER — Encounter: Payer: Self-pay | Admitting: *Deleted

## 2013-05-24 ENCOUNTER — Ambulatory Visit: Payer: Self-pay | Admitting: Podiatry

## 2013-08-28 ENCOUNTER — Ambulatory Visit: Payer: Self-pay | Admitting: Oncology

## 2013-09-25 ENCOUNTER — Ambulatory Visit: Payer: Medicare HMO | Admitting: Internal Medicine

## 2013-10-19 ENCOUNTER — Telehealth: Payer: Self-pay | Admitting: *Deleted

## 2013-10-19 NOTE — Telephone Encounter (Signed)
Pt called requesting Crestor 10mg  refill.  Pt is requesting Rx be faxed to [place].  Please advise refill.

## 2013-10-21 NOTE — Telephone Encounter (Signed)
I am ok to refill crestor x 1, but she does not have a f/u appt scheduled with me.  Need schedule an appt and also needs fasting labs.  Overdue.  Please schedule appt and let me know and I will order labs.  Thanks.

## 2013-10-22 MED ORDER — ROSUVASTATIN CALCIUM 10 MG PO TABS: 10.0000 mg | ORAL_TABLET | Freq: Every day | ORAL | Status: DC

## 2013-10-22 NOTE — Telephone Encounter (Signed)
Rx faxed as requested.  Left message on pts VM that appoint was needed.

## 2013-10-22 NOTE — Addendum Note (Signed)
Addended by: Johnsie Cancel on: 10/22/2013 10:36 AM   Modules accepted: Orders

## 2013-10-23 ENCOUNTER — Telehealth: Payer: Self-pay | Admitting: Internal Medicine

## 2013-10-23 NOTE — Telephone Encounter (Signed)
Please advise where to put this patient for a 1 month med refill. Pt currently scheduled for 9/14.msn

## 2013-10-23 NOTE — Telephone Encounter (Signed)
I can see her Friday (10/26/13)  at 11:30-11:45.   Let her know her medication has been sent in.

## 2013-10-23 NOTE — Telephone Encounter (Signed)
Please advise. Pt was given a 30 day supply of Crestor yesterday with an additional refill.

## 2013-10-26 ENCOUNTER — Encounter: Payer: Self-pay | Admitting: Internal Medicine

## 2013-10-26 ENCOUNTER — Telehealth: Payer: Self-pay | Admitting: *Deleted

## 2013-10-26 ENCOUNTER — Ambulatory Visit (INDEPENDENT_AMBULATORY_CARE_PROVIDER_SITE_OTHER): Payer: Medicare HMO | Admitting: Internal Medicine

## 2013-10-26 VITALS — BP 110/60 | HR 96 | Temp 98.0°F | Ht 65.0 in | Wt 210.2 lb

## 2013-10-26 DIAGNOSIS — E669 Obesity, unspecified: Secondary | ICD-10-CM

## 2013-10-26 DIAGNOSIS — K219 Gastro-esophageal reflux disease without esophagitis: Secondary | ICD-10-CM

## 2013-10-26 DIAGNOSIS — R739 Hyperglycemia, unspecified: Secondary | ICD-10-CM

## 2013-10-26 DIAGNOSIS — K5790 Diverticulosis of intestine, part unspecified, without perforation or abscess without bleeding: Secondary | ICD-10-CM

## 2013-10-26 DIAGNOSIS — C50912 Malignant neoplasm of unspecified site of left female breast: Secondary | ICD-10-CM

## 2013-10-26 DIAGNOSIS — Z9109 Other allergy status, other than to drugs and biological substances: Secondary | ICD-10-CM

## 2013-10-26 DIAGNOSIS — E7800 Pure hypercholesterolemia, unspecified: Secondary | ICD-10-CM

## 2013-10-26 DIAGNOSIS — K5730 Diverticulosis of large intestine without perforation or abscess without bleeding: Secondary | ICD-10-CM

## 2013-10-26 DIAGNOSIS — C50919 Malignant neoplasm of unspecified site of unspecified female breast: Secondary | ICD-10-CM

## 2013-10-26 MED ORDER — ROSUVASTATIN CALCIUM 10 MG PO TABS: 10.0000 mg | ORAL_TABLET | Freq: Every day | ORAL | Status: DC

## 2013-10-26 MED ORDER — BUDESONIDE-FORMOTEROL FUMARATE 160-4.5 MCG/ACT IN AERO: 2.0000 | INHALATION_SPRAY | Freq: Two times a day (BID) | RESPIRATORY_TRACT | Status: DC

## 2013-10-26 NOTE — Telephone Encounter (Signed)
Pt is coming in Monday what labs and dx? 

## 2013-10-26 NOTE — Telephone Encounter (Signed)
Orders placed for labs

## 2013-10-26 NOTE — Progress Notes (Signed)
Pre visit review using our clinic review tool, if applicable. No additional management support is needed unless otherwise documented below in the visit note. 

## 2013-10-27 ENCOUNTER — Encounter: Payer: Self-pay | Admitting: Internal Medicine

## 2013-10-27 DIAGNOSIS — Z6834 Body mass index (BMI) 34.0-34.9, adult: Secondary | ICD-10-CM | POA: Insufficient documentation

## 2013-10-27 DIAGNOSIS — Z6838 Body mass index (BMI) 38.0-38.9, adult: Secondary | ICD-10-CM | POA: Insufficient documentation

## 2013-10-27 NOTE — Progress Notes (Signed)
Subjective:    Patient ID: Robin Hill, female    DOB: August 20, 1950, 63 y.o.   MRN: [id number]  HPI 63 year old female with past history of hypercholesterolemia, reoccurring allergy and sinus problems and breast cancer.  She comes in today for a scheduled follow up.   Her breathing is stable.  No significant sinus or allergy symptoms. Exercising.  Water aerobics.  No cardiac symptoms with increased activity or exertion.  No sob.  Bowels stable.  She is on Lyrica for her feet.  No abdominal pain or cramping.  Bowels stable.  Going to the gym 3x/week.  Feels good.  Has f/u with Dr Ma Hillock 12/15/13.      Past Medical History  Diagnosis Date  . Allergy   . Dysmenorrhea   . Hypercholesterolemia   . Breast cancer   . Diverticulosis     Current Outpatient Prescriptions on File Prior to Visit  Medication Sig Dispense Refill  . Albuterol Sulfate (PROAIR HFA IN) 90 mcg. 2 puffs using inhaler four times a day as needed.      Marland Kitchen anastrozole (ARIMIDEX) 1 MG tablet Take 1 mg by mouth daily.      Marland Kitchen azelastine (ASTELIN) 137 MCG/SPRAY nasal spray 137 mcg. Spray 2 sprays into both nostrils twice a day      . Azelastine HCl (ASTEPRO) 0.15 % SOLN Spray 2 sprays into both nostrils once a day as needed (For allergies)      . b complex vitamins capsule Take 1 capsule by mouth daily.      . Calcium Carb-Cholecalciferol (CALCIUM 600/VITAMIN D3) 600-800 MG-UNIT TABS Take by mouth daily.      . calcium carbonate (TUMS - DOSED IN MG ELEMENTAL CALCIUM) 500 MG chewable tablet Chew 1 tablet by mouth daily.      . cetirizine (ZYRTEC) 10 MG tablet Take 10 mg by mouth daily.      . cholecalciferol (VITAMIN D) 400 UNITS TABS Take 400 Units by mouth daily. Chewable      . Cyanocobalamin (VITAMIN B12 PO) Take 500 mcg by mouth daily. Administer 1 tablet by mouth once a day      . fluticasone (FLONASE) 50 MCG/ACT nasal spray Place 2 sprays into both nostrils daily. 1 spray into both nostrils twice a day  16 g  5  .  levalbuterol (XOPENEX HFA) 45 MCG/ACT inhaler Inhale 2 puffs four times a day as needed      . loratadine (CLARITIN) 10 MG tablet Take 1 tablet by mouth once a day as needed      . montelukast (SINGULAIR) 10 MG tablet Take 1 tablet (10 mg total) by mouth at bedtime. 1 tablet once a day  30 tablet  5  . pregabalin (LYRICA) 75 MG capsule Take 150 mg by mouth 2 (two) times daily.      . sodium chloride (OCEAN) 0.65 % nasal spray Place 1 spray into the nose as needed for congestion.      . vitamin E 400 UNIT capsule Take 400 Units by mouth daily.       No current facility-administered medications on file prior to visit.     Review of Systems Patient denies any headache, lightheadedness or dizziness.  No sinus or allergy problems now.  No chest pain, tightness or palpitations.  No increased shortness of breath, cough or congestion.  No nausea or vomiting.  No acid reflux.  No abdominal pain or cramping.  No bowel change, such as diarrhea, constipation, BRBPR  or melana.  No urine change.  No vaginal problems reported.  Exercising.  Overall feels good.        Objective:   Physical Exam . Filed Vitals:   10/26/13 1141  BP: 110/60  Pulse: 96  Temp: 98 F (36.7 C)   Blood pressure recheck:  82/109  63 year old female in no acute distress.   HEENT:  Nares- clear.  Oropharynx - without lesions. NECK:  Supple.  Nontender.  No audible bruit.  HEART:  Appears to be regular. LUNGS:  No crackles or wheezing audible.  Respirations even and unlabored.  RADIAL PULSE:  Equal bilaterally. ABDOMEN:  Soft, nontender.  Bowel sounds present and normal.  No audible abdominal bruit.  EXTREMITIES:  No increased edema present.  DP pulses palpable and equal bilaterally.         Assessment & Plan:  GYN.  No problems reported today.  Pelvic/pap 05/14/13.  PAP negative with negative HPV.    PULMONARY.  Breathing stable.   NEED FOR BONE DENSITY.  On arimidex.  Bone density 03/26/13 - normal.     HEALTH  MAINTENANCE.   Mammogram through Dr Ma Hillock.  Colonoscopy 09/17/05 revealed internal hemorrhoids and diverticulosis.  Bone density 03/26/13 - normal.  PAP 05/14/13 negative with negative HPV.

## 2013-10-27 NOTE — Assessment & Plan Note (Signed)
Symptoms controlled.  Follow.   

## 2013-10-27 NOTE — Assessment & Plan Note (Signed)
Has reoccurring allergies and sinus problems.  Stable now.  Follow.  Take singulair, flonase and zyrtec regularly.

## 2013-10-27 NOTE — Assessment & Plan Note (Signed)
She is going to the gym 3x/week.  Exercising.  Watching what she eats.  Continue diet and exercise.

## 2013-10-27 NOTE — Assessment & Plan Note (Signed)
Bowels doing well.  Follow.

## 2013-10-27 NOTE — Assessment & Plan Note (Signed)
Low cholesterol diet and exercise.  Follow lipid panel.   On Crestor.

## 2013-10-27 NOTE — Assessment & Plan Note (Signed)
Followed by Dr Ma Hillock and Dr Tamala Julian.  Up to date.  States had mammogram relatively recently - ok.  Follow.  Has f/u with Dr Ma Hillock 8/15.

## 2013-10-29 ENCOUNTER — Other Ambulatory Visit (INDEPENDENT_AMBULATORY_CARE_PROVIDER_SITE_OTHER): Payer: Medicare HMO

## 2013-10-29 DIAGNOSIS — E7800 Pure hypercholesterolemia, unspecified: Secondary | ICD-10-CM

## 2013-10-29 DIAGNOSIS — R7309 Other abnormal glucose: Secondary | ICD-10-CM

## 2013-10-29 DIAGNOSIS — R739 Hyperglycemia, unspecified: Secondary | ICD-10-CM

## 2013-10-29 LAB — LIPID PANEL
CHOL/HDL RATIO: 3
Cholesterol: 198 mg/dL (ref 0–200)
HDL: 76.4 mg/dL (ref >39.00)
LDL Cholesterol: 108 mg/dL — ABNORMAL HIGH (ref 0–99)
NONHDL: 121.6
TRIGLYCERIDES: 66 mg/dL (ref 0.0–149.0)
VLDL: 13.2 mg/dL (ref 0.0–40.0)

## 2013-10-29 LAB — BASIC METABOLIC PANEL
BUN: 14 mg/dL (ref 6–23)
CHLORIDE: 105 meq/L (ref 96–112)
CO2: 27 meq/L (ref 19–32)
Calcium: 9.3 mg/dL (ref 8.4–10.5)
Creatinine, Ser: 0.8 mg/dL (ref 0.4–1.2)
GFR: 92.97 mL/min (ref >60.00)
Glucose, Bld: 94 mg/dL (ref 70–99)
Potassium: 4.3 mEq/L (ref 3.5–5.1)
SODIUM: 139 meq/L (ref 135–145)

## 2013-10-29 LAB — HEPATIC FUNCTION PANEL
ALBUMIN: 4 g/dL (ref 3.5–5.2)
ALT: 19 U/L (ref 0–35)
AST: 29 U/L (ref 0–37)
Alkaline Phosphatase: 63 U/L (ref 39–117)
Bilirubin, Direct: 0.1 mg/dL (ref 0.0–0.3)
TOTAL PROTEIN: 7.2 g/dL (ref 6.0–8.3)
Total Bilirubin: 0.6 mg/dL (ref 0.2–1.2)

## 2013-10-29 LAB — HEMOGLOBIN A1C: HEMOGLOBIN A1C: 6 % (ref 4.6–6.5)

## 2013-10-29 LAB — TSH: TSH: 3.75 u[IU]/mL (ref 0.35–4.50)

## 2013-10-30 ENCOUNTER — Encounter: Payer: Self-pay | Admitting: *Deleted

## 2013-12-21 ENCOUNTER — Ambulatory Visit: Payer: Self-pay | Admitting: Internal Medicine

## 2013-12-24 LAB — CBC CANCER CENTER
Basophil #: 0 x10 3/mm (ref 0.0–0.1)
Basophil %: 0.8 %
EOS ABS: 0.2 x10 3/mm (ref 0.0–0.7)
Eosinophil %: 3.5 %
HCT: 37.6 % (ref 35.0–47.0)
HGB: 12.5 g/dL (ref 12.0–16.0)
Lymphocyte #: 1.9 x10 3/mm (ref 1.0–3.6)
Lymphocyte %: 30.4 %
MCH: 30.4 pg (ref 26.0–34.0)
MCHC: 33.2 g/dL (ref 32.0–36.0)
MCV: 92 fL (ref 80–100)
MONOS PCT: 6.2 %
Monocyte #: 0.4 x10 3/mm (ref 0.2–0.9)
NEUTROS ABS: 3.7 x10 3/mm (ref 1.4–6.5)
Neutrophil %: 59.1 %
PLATELETS: 181 x10 3/mm (ref 150–440)
RBC: 4.1 10*6/uL (ref 3.80–5.20)
RDW: 13.2 % (ref 11.5–14.5)
WBC: 6.2 x10 3/mm (ref 3.6–11.0)

## 2014-01-01 ENCOUNTER — Ambulatory Visit: Payer: Self-pay | Admitting: Internal Medicine

## 2014-01-14 ENCOUNTER — Other Ambulatory Visit (INDEPENDENT_AMBULATORY_CARE_PROVIDER_SITE_OTHER): Payer: Medicare HMO

## 2014-01-14 ENCOUNTER — Telehealth: Payer: Self-pay | Admitting: *Deleted

## 2014-01-14 ENCOUNTER — Ambulatory Visit: Payer: Medicare HMO | Admitting: Internal Medicine

## 2014-01-14 DIAGNOSIS — E7800 Pure hypercholesterolemia, unspecified: Secondary | ICD-10-CM

## 2014-01-14 DIAGNOSIS — R7309 Other abnormal glucose: Secondary | ICD-10-CM

## 2014-01-14 DIAGNOSIS — R739 Hyperglycemia, unspecified: Secondary | ICD-10-CM

## 2014-01-14 LAB — BASIC METABOLIC PANEL
BUN: 12 mg/dL (ref 6–23)
CHLORIDE: 103 meq/L (ref 96–112)
CO2: 28 mEq/L (ref 19–32)
Calcium: 9.9 mg/dL (ref 8.4–10.5)
Creatinine, Ser: 1 mg/dL (ref 0.4–1.2)
GFR: 74.38 mL/min (ref >60.00)
Glucose, Bld: 96 mg/dL (ref 70–99)
POTASSIUM: 4.2 meq/L (ref 3.5–5.1)
Sodium: 140 mEq/L (ref 135–145)

## 2014-01-14 LAB — HEPATIC FUNCTION PANEL
ALT: 26 U/L (ref 0–35)
AST: 32 U/L (ref 0–37)
Albumin: 3.8 g/dL (ref 3.5–5.2)
Alkaline Phosphatase: 68 U/L (ref 39–117)
BILIRUBIN TOTAL: 0.8 mg/dL (ref 0.2–1.2)
Bilirubin, Direct: 0.1 mg/dL (ref 0.0–0.3)
Total Protein: 7.2 g/dL (ref 6.0–8.3)

## 2014-01-14 LAB — LIPID PANEL
CHOLESTEROL: 190 mg/dL (ref 0–200)
HDL: 66.7 mg/dL (ref >39.00)
LDL CALC: 109 mg/dL — AB (ref 0–99)
NonHDL: 123.3
Total CHOL/HDL Ratio: 3
Triglycerides: 72 mg/dL (ref 0.0–149.0)
VLDL: 14.4 mg/dL (ref 0.0–40.0)

## 2014-01-14 LAB — HEMOGLOBIN A1C: Hgb A1c MFr Bld: 6.1 % (ref 4.6–6.5)

## 2014-01-14 NOTE — Telephone Encounter (Signed)
What labs and dx?  

## 2014-01-14 NOTE — Telephone Encounter (Signed)
Orders placed for labs

## 2014-01-15 ENCOUNTER — Encounter: Payer: Self-pay | Admitting: *Deleted

## 2014-03-04 ENCOUNTER — Encounter: Payer: Medicare HMO | Admitting: Internal Medicine

## 2014-05-08 ENCOUNTER — Telehealth: Payer: Self-pay

## 2014-05-08 DIAGNOSIS — J069 Acute upper respiratory infection, unspecified: Secondary | ICD-10-CM | POA: Diagnosis not present

## 2014-05-08 NOTE — Telephone Encounter (Signed)
The patient called and is hoping to get an rx of tussinex called in for cough

## 2014-05-08 NOTE — Telephone Encounter (Signed)
This is a triage call (needs more information)

## 2014-05-08 NOTE — Telephone Encounter (Signed)
Spoke with pt, advised of MDs message.  She verbalized understanding and said she would be evaluated.

## 2014-05-08 NOTE — Telephone Encounter (Signed)
If having issues with sob and difficulty talking secondary to sob, needs to be evaluated.  If having these problems, I recommend evaluation today.  Let her know I am not in office this pm.  If Robin Hill does not have anything, then to acute care.  We can f/u after.

## 2014-05-08 NOTE — Telephone Encounter (Signed)
Spoke with pt.  Pt is wheezing, states she has used her inhalers however are not effective.  Pt is having difficulty speaking in complete sentences due to wheezing.  Pt is also requesting something for cough.  Please advise

## 2014-07-12 ENCOUNTER — Other Ambulatory Visit: Payer: Self-pay | Admitting: *Deleted

## 2014-07-12 ENCOUNTER — Telehealth: Payer: Self-pay | Admitting: Internal Medicine

## 2014-07-12 MED ORDER — ROSUVASTATIN CALCIUM 10 MG PO TABS: 10.0000 mg | ORAL_TABLET | Freq: Every day | ORAL | Status: DC

## 2014-07-12 NOTE — Telephone Encounter (Signed)
rx faxed

## 2014-07-12 NOTE — Telephone Encounter (Signed)
rosuvastatin (CRESTOR) 10 MG tablet  Please send the refill question to this fax # [phone] [place]

## 2014-07-20 DIAGNOSIS — J101 Influenza due to other identified influenza virus with other respiratory manifestations: Secondary | ICD-10-CM | POA: Diagnosis not present

## 2014-07-20 DIAGNOSIS — R05 Cough: Secondary | ICD-10-CM | POA: Diagnosis not present

## 2014-07-31 ENCOUNTER — Ambulatory Visit (INDEPENDENT_AMBULATORY_CARE_PROVIDER_SITE_OTHER): Payer: Medicare Other | Admitting: Internal Medicine

## 2014-07-31 ENCOUNTER — Encounter: Payer: Self-pay | Admitting: Internal Medicine

## 2014-07-31 ENCOUNTER — Telehealth: Payer: Self-pay | Admitting: Internal Medicine

## 2014-07-31 VITALS — BP 116/75 | HR 88 | Temp 98.3°F | Ht 65.0 in | Wt 218.4 lb

## 2014-07-31 DIAGNOSIS — J45909 Unspecified asthma, uncomplicated: Secondary | ICD-10-CM | POA: Insufficient documentation

## 2014-07-31 DIAGNOSIS — J4541 Moderate persistent asthma with (acute) exacerbation: Secondary | ICD-10-CM | POA: Diagnosis not present

## 2014-07-31 DIAGNOSIS — J209 Acute bronchitis, unspecified: Secondary | ICD-10-CM | POA: Insufficient documentation

## 2014-07-31 MED ORDER — PREDNISONE 10 MG PO TABS: ORAL_TABLET | ORAL | Status: DC

## 2014-07-31 MED ORDER — HYDROCOD POLST-CHLORPHEN POLST 10-8 MG/5ML PO LQCR: 5.0000 mL | Freq: Two times a day (BID) | ORAL | Status: DC | PRN

## 2014-07-31 MED ORDER — LEVOFLOXACIN 500 MG PO TABS: 500.0000 mg | ORAL_TABLET | Freq: Every day | ORAL | Status: DC

## 2014-07-31 NOTE — Telephone Encounter (Signed)
Pt denies slurred speech, pt denies any other numbness or weakness, chest pain or dysnea.  Pt was seen by [facility] on the 19th with wheezing and fever, dx with flu.  Pt states she is still having wheezing, cough and phlegm.

## 2014-07-31 NOTE — Telephone Encounter (Signed)
[facility] Patient Name: Robin Hill DOB: 1951/03/29 Initial Comment caller states she was dx with the flu - states one side of her face is numb and tingling when she woke up this am Nurse Assessment Nurse: Mechele Dawley, RN, Amy Date/Time Eilene Ghazi Time): 07/31/2014 8:27:36 AM Confirm and document reason for call. If symptomatic, describe symptoms. ---CALLER STATES SHE WAS DIAGNOSED WITH THE FLU ON THE 19TH. WHEN SHE WOKE UP UNDERNEATH HER EYE ON LEFT SIDE AND CHEEK BONE ON THE RIGHT HAND SIDE FACE IS TINGLING AND FEELS FUNNY. SHE CAN FEEL WHEN SHE TOUCHES IT. CALLER STATES SHE HAS TO COUGH AND SHE WILL GET UP WHITE PHLEGM. NO CHEST TIGHTNESS OR COMPLAINTS OF IN THE CHEST. SHE WAS GIVEN TAMIFLU AND PREDNISONE. Has the patient traveled out of the country within the last 30 days? ---Not Applicable Does the patient require triage? ---Yes Related visit to physician within the last 2 weeks? ---Yes Does the PT have any chronic conditions? (i.e. diabetes, asthma, etc.) ---Yes List chronic conditions. ---CANCER SURVIVOR, LYRICA, ALLERGIES, Guidelines Guideline Title Affirmed Question Affirmed Notes Cough - Acute Productive [1] Sinus pain (around cheekbone or eye) AND [2] present > 24 hours using nasal washes and pain meds Final Disposition User See Physician within [place], Therapist, sports, Amy Comments PATIENT CALLED AND CONFIRMED THE APPT TIME OF [place] DR. Gilford Rile.

## 2014-07-31 NOTE — Telephone Encounter (Signed)
Can you please get more information from her? Does she have slurred speech? Any other focal numbness or weakness? Chest pain? Dyspnea?

## 2014-07-31 NOTE — Telephone Encounter (Signed)
FYI

## 2014-07-31 NOTE — Assessment & Plan Note (Signed)
Pt has history of asthma. Reviewed that Symbicort is preventative med, taken twice daily. Recommended using Xopenex every 6 hours as needed for cough or dypsnea. Discussed current exacerbation of symptoms and role of Prednisone as anti-inflammatory. Follow up recheck in 1 week.

## 2014-07-31 NOTE — Assessment & Plan Note (Signed)
Symptoms and exam most consistent with bronchitis. Will start Prednisone taper and Levaquin.  Tuessionex as needed for cough. Follow up next week for recheck or sooner if symptoms are not improving.

## 2014-07-31 NOTE — Progress Notes (Signed)
Subjective:    Patient ID: Robin Hill, female    DOB: 06/29/1950, 64 y.o.   MRN: [id number]  HPI  64YO female presents for acute visit.  Seen at [facility] clinic 3/19 and diagnosed with Influenza A. Felt tired with cough and wheezing at that time. Treated with Tamiflu. Symptoms improved somewhat, however cough has persisted. Cough is productive of thick mucous. Wheezing at night.  No dyspnea. No recurrent fever. No chest pain. Non smoker. Typically has bronchitis yearly. Using Xopenex with some improvement. Also taking Symbicort bid, but does not understand how these two medications are different.  Past medical, surgical, family and social history per today's encounter.  Review of Systems  Constitutional: Positive for fatigue. Negative for fever, chills and unexpected weight change.  HENT: Negative for congestion, ear discharge, ear pain, facial swelling, hearing loss, mouth sores, nosebleeds, postnasal drip, rhinorrhea, sinus pressure, sneezing, sore throat, tinnitus, trouble swallowing and voice change.   Eyes: Negative for pain, discharge, redness and visual disturbance.  Respiratory: Positive for cough, chest tightness and wheezing. Negative for shortness of breath and stridor.   Cardiovascular: Negative for chest pain, palpitations and leg swelling.  Musculoskeletal: Negative for myalgias, arthralgias, neck pain and neck stiffness.  Skin: Negative for color change and rash.  Neurological: Negative for dizziness, weakness, light-headedness and headaches.  Hematological: Negative for adenopathy.       Objective:    BP 116/75 mmHg  Pulse 88  Temp(Src) 98.3 F (36.8 C) (Oral)  Ht 5\' 5"  (1.651 m)  Wt 218 lb 6 oz (99.054 kg)  BMI 36.34 kg/m2  SpO2 96% Physical Exam  Constitutional: She is oriented to person, place, and time. She appears well-developed and well-nourished. No distress.  HENT:  Head: Normocephalic and atraumatic.  Right Ear: External ear normal.  Left  Ear: External ear normal.  Nose: Nose normal.  Mouth/Throat: Oropharynx is clear and moist. No oropharyngeal exudate.  Eyes: Conjunctivae are normal. Pupils are equal, round, and reactive to light. Right eye exhibits no discharge. Left eye exhibits no discharge. No scleral icterus.  Neck: Normal range of motion. Neck supple. No tracheal deviation present. No thyromegaly present.  Cardiovascular: Normal rate, regular rhythm, normal heart sounds and intact distal pulses.  Exam reveals no gallop and no friction rub.   No murmur heard. Pulmonary/Chest: Effort normal. No accessory muscle usage. No tachypnea. No respiratory distress. She has decreased breath sounds. She has wheezes. She has rhonchi (scattered). She has no rales. She exhibits no tenderness.  Musculoskeletal: Normal range of motion. She exhibits no edema or tenderness.  Lymphadenopathy:    She has no cervical adenopathy.  Neurological: She is alert and oriented to person, place, and time. No cranial nerve deficit. She exhibits normal muscle tone. Coordination normal.  Skin: Skin is warm and dry. No rash noted. She is not diaphoretic. No erythema. No pallor.  Psychiatric: She has a normal mood and affect. Her behavior is normal. Judgment and thought content normal.          Assessment & Plan:   Problem List Items Addressed This Visit      Unprioritized   Acute bronchitis - Primary    Symptoms and exam most consistent with bronchitis. Will start Prednisone taper and Levaquin.  Tuessionex as needed for cough. Follow up next week for recheck or sooner if symptoms are not improving.       Relevant Medications   levofloxacin (LEVAQUIN) tablet   predniSONE (DELTASONE) tablet   chlorpheniramine-HYDROcodone (TUSSIONEX)  suspension 8-10 mg/57mL   Airway hyperreactivity    Pt has history of asthma. Reviewed that Symbicort is preventative med, taken twice daily. Recommended using Xopenex every 6 hours as needed for cough or dypsnea.  Discussed current exacerbation of symptoms and role of Prednisone as anti-inflammatory. Follow up recheck in 1 week.      Relevant Medications   predniSONE (DELTASONE) tablet       Return in about 1 week (around 08/07/2014) for Recheck.

## 2014-07-31 NOTE — Patient Instructions (Signed)
Start Levaquin 500mg  daily.  Tomorrow, start prednisone taper.  Use Tussionex as needed for cough.  Follow up in 1 week or sooner as needed.

## 2014-07-31 NOTE — Progress Notes (Signed)
Pre visit review using our clinic review tool, if applicable. No additional management support is needed unless otherwise documented below in the visit note. 

## 2014-08-01 ENCOUNTER — Telehealth: Payer: Self-pay | Admitting: *Deleted

## 2014-08-01 NOTE — Telephone Encounter (Signed)
Pt left vm stating that the cough medication that was given to her yesterday by Dr Gilford Rile, chlorpheniramine-HYDROcodone (TUSSIONEX) suspension 8-10 mg/67mL, was not covered by her insurance (too expensive), asking for something else to be sent to pharmacy

## 2014-08-02 ENCOUNTER — Other Ambulatory Visit: Payer: Self-pay | Admitting: *Deleted

## 2014-08-02 MED ORDER — BENZONATATE 100 MG PO CAPS: 100.0000 mg | ORAL_CAPSULE | Freq: Three times a day (TID) | ORAL | Status: DC | PRN

## 2014-08-02 NOTE — Telephone Encounter (Signed)
It appears she saw Dr Gilford Rile on 07/31/14.  She was given prednisone and abx.  The prednisone is probably what she needed for the cough.  Does she still feel she needs something more for the cough.  If so, we can try tessalon perles 100mg  tid prn #21 with no refills.  Confirm no problem ever taking this medication.  Also, in Dr Thomes Dinning note she had stated f/u next week.  No f/u scheduled.  I can see her Tuesday at 2:30 if needs evaluation.  (block the 30 min appt).

## 2014-08-02 NOTE — Telephone Encounter (Signed)
Sent in [place] to [place] on [address]. & pt coming in Monday for f/u (only had a 15 minute slot available)

## 2014-08-05 ENCOUNTER — Ambulatory Visit (INDEPENDENT_AMBULATORY_CARE_PROVIDER_SITE_OTHER): Payer: Medicare Other | Admitting: Internal Medicine

## 2014-08-05 ENCOUNTER — Encounter: Payer: Self-pay | Admitting: Internal Medicine

## 2014-08-05 VITALS — BP 130/80 | HR 80 | Temp 98.2°F | Ht 65.0 in | Wt 219.1 lb

## 2014-08-05 DIAGNOSIS — K219 Gastro-esophageal reflux disease without esophagitis: Secondary | ICD-10-CM | POA: Diagnosis not present

## 2014-08-05 DIAGNOSIS — J209 Acute bronchitis, unspecified: Secondary | ICD-10-CM | POA: Diagnosis not present

## 2014-08-05 DIAGNOSIS — J4541 Moderate persistent asthma with (acute) exacerbation: Secondary | ICD-10-CM | POA: Diagnosis not present

## 2014-08-05 NOTE — Patient Instructions (Signed)
mucinex DM in the am and robitussin DM in the evening.    Continue inhalers as you are doing.    Complete the antibiotics and prednisone.

## 2014-08-05 NOTE — Progress Notes (Signed)
Pre visit review using our clinic review tool, if applicable. No additional management support is needed unless otherwise documented below in the visit note. 

## 2014-08-10 ENCOUNTER — Encounter: Payer: Self-pay | Admitting: Internal Medicine

## 2014-08-10 ENCOUNTER — Telehealth: Payer: Self-pay | Admitting: Internal Medicine

## 2014-08-10 MED ORDER — ROSUVASTATIN CALCIUM 10 MG PO TABS: 10.0000 mg | ORAL_TABLET | Freq: Every day | ORAL | Status: DC

## 2014-08-10 MED ORDER — BUDESONIDE-FORMOTEROL FUMARATE 160-4.5 MCG/ACT IN AERO: 2.0000 | INHALATION_SPRAY | Freq: Two times a day (BID) | RESPIRATORY_TRACT | Status: DC

## 2014-08-10 NOTE — Progress Notes (Signed)
Patient ID: Robin Hill, female   DOB: 1950-08-21, 64 y.o.   MRN: [id number]   Subjective:    Patient ID: Robin Hill, female    DOB: March 08, 1951, 64 y.o.   MRN: [id number]  HPI  Patient here as a work in to f/u regarding cough and congestion.  Was seen 07/31/14.  Treated with Levaquin and prednisone.  Using her inhalers.  Cough and congestion better.  No fever.  No sob.  Eating and drinking well.  Bowels stable.     Past Medical History  Diagnosis Date  . Allergy   . Dysmenorrhea   . Hypercholesterolemia   . Breast cancer   . Diverticulosis     Current Outpatient Prescriptions on File Prior to Visit  Medication Sig Dispense Refill  . Albuterol Sulfate (PROAIR HFA IN) 90 mcg. 2 puffs using inhaler four times a day as needed.    Marland Kitchen anastrozole (ARIMIDEX) 1 MG tablet Take 1 mg by mouth daily.    Marland Kitchen azelastine (ASTELIN) 137 MCG/SPRAY nasal spray 137 mcg. Spray 2 sprays into both nostrils twice a day    . Azelastine HCl (ASTEPRO) 0.15 % SOLN Spray 2 sprays into both nostrils once a day as needed (For allergies)    . b complex vitamins capsule Take 1 capsule by mouth daily.    . benzonatate (TESSALON) 100 MG capsule Take 1 capsule (100 mg total) by mouth 3 (three) times daily as needed for cough. 21 capsule 0  . budesonide-formoterol (SYMBICORT) 160-4.5 MCG/ACT inhaler Inhale 2 puffs into the lungs 2 (two) times daily. 3 Inhaler 3  . Calcium Carb-Cholecalciferol (CALCIUM 600/VITAMIN D3) 600-800 MG-UNIT TABS Take by mouth daily.    . calcium carbonate (TUMS - DOSED IN MG ELEMENTAL CALCIUM) 500 MG chewable tablet Chew 1 tablet by mouth daily.    . cetirizine (ZYRTEC) 10 MG tablet Take 10 mg by mouth daily.    . cholecalciferol (VITAMIN D) 400 UNITS TABS Take 400 Units by mouth daily. Chewable    . Cyanocobalamin (VITAMIN B12 PO) Take 500 mcg by mouth daily. Administer 1 tablet by mouth once a day    . fluticasone (FLONASE) 50 MCG/ACT nasal spray Place 2 sprays into both nostrils daily.  1 spray into both nostrils twice a day 16 g 5  . levalbuterol (XOPENEX HFA) 45 MCG/ACT inhaler Inhale 2 puffs four times a day as needed    . levofloxacin (LEVAQUIN) 500 MG tablet Take 1 tablet (500 mg total) by mouth daily. 7 tablet 0  . loratadine (CLARITIN) 10 MG tablet Take 1 tablet by mouth once a day as needed    . montelukast (SINGULAIR) 10 MG tablet Take 1 tablet (10 mg total) by mouth at bedtime. 1 tablet once a day 30 tablet 5  . predniSONE (DELTASONE) 10 MG tablet Take 60mg  by mouth on day 1, then taper by 10mg  daily until gone 21 tablet 0  . pregabalin (LYRICA) 75 MG capsule Take 150 mg by mouth 2 (two) times daily.    . rosuvastatin (CRESTOR) 10 MG tablet Take 1 tablet (10 mg total) by mouth daily. 30 tablet 1  . sodium chloride (OCEAN) 0.65 % nasal spray Place 1 spray into the nose as needed for congestion.    . vitamin E 400 UNIT capsule Take 400 Units by mouth daily.     No current facility-administered medications on file prior to visit.    Review of Systems  Constitutional: Negative for appetite change and unexpected weight change.  HENT: Positive for congestion (congestion is better.  chest congestion better. ). Negative for sinus pressure.   Respiratory: Positive for cough (improved. ). Negative for chest tightness, shortness of breath and wheezing.   Cardiovascular: Negative for chest pain, palpitations and leg swelling.  Gastrointestinal: Negative for nausea, vomiting, abdominal pain and diarrhea.  Neurological: Negative for dizziness, light-headedness and headaches.       Objective:    Physical Exam  HENT:  Nose: Nose normal.  Mouth/Throat: Oropharynx is clear and moist.  Neck: Neck supple. No thyromegaly present.  Cardiovascular: Normal rate and regular rhythm.   Pulmonary/Chest: Breath sounds normal. No respiratory distress. She has no wheezes.  No increased cough with forced expiration.   Abdominal: Soft.  Lymphadenopathy:    She has no cervical  adenopathy.    BP 130/80 mmHg  Pulse 80  Temp(Src) 98.2 F (36.8 C) (Oral)  Ht 5\' 5"  (1.651 m)  Wt 219 lb 2 oz (99.394 kg)  BMI 36.46 kg/m2  SpO2 96% Wt Readings from Last 3 Encounters:  08/05/14 219 lb 2 oz (99.394 kg)  07/31/14 218 lb 6 oz (99.054 kg)  10/26/13 210 lb 4 oz (95.369 kg)     Lab Results  Component Value Date   GLUCOSE 96 01/14/2014   CHOL 190 01/14/2014   TRIG 72.0 01/14/2014   HDL 66.70 01/14/2014   LDLDIRECT 126.2 05/09/2013   LDLCALC 109* 01/14/2014   ALT 26 01/14/2014   AST 32 01/14/2014   NA 140 01/14/2014   K 4.2 01/14/2014   CL 103 01/14/2014   CREATININE 1.0 01/14/2014   BUN 12 01/14/2014   CO2 28 01/14/2014   TSH 3.75 10/29/2013   HGBA1C 6.1 01/14/2014   MICROALBUR 0.2 05/09/2013       Assessment & Plan:   Problem List Items Addressed This Visit    Acute bronchitis - Primary    Cough and congestion improved.  Saw Dr Gilford Rile 07/31/14.  She is to complete her abx and prednisone.  Will add mucinex DM in the am and robitussin DM in the evening.  Follow.  Continue inhalers as she is doing.       Airway hyperreactivity    Complete prednisone.  Continue inhalers.  Cough better.  Breathing doing well.        GERD (gastroesophageal reflux disease)    Symptoms controlled.            Einar Pheasant, MD

## 2014-08-10 NOTE — Assessment & Plan Note (Signed)
Symptoms controlled

## 2014-08-10 NOTE — Assessment & Plan Note (Signed)
Cough and congestion improved.  Saw Dr Gilford Rile 07/31/14.  She is to complete her abx and prednisone.  Will add mucinex DM in the am and robitussin DM in the evening.  Follow.  Continue inhalers as she is doing.

## 2014-08-10 NOTE — Assessment & Plan Note (Signed)
Complete prednisone.  Continue inhalers.  Cough better.  Breathing doing well.

## 2014-08-10 NOTE — Telephone Encounter (Signed)
Please fax her prescription for crestor and symbicort to her mail order company.  See phone note 07/12/14 - fax number in that phone note.

## 2014-08-12 NOTE — Telephone Encounter (Signed)
Rx faxed

## 2014-08-28 ENCOUNTER — Ambulatory Visit (INDEPENDENT_AMBULATORY_CARE_PROVIDER_SITE_OTHER): Payer: Medicare Other | Admitting: Internal Medicine

## 2014-08-28 ENCOUNTER — Encounter: Payer: Self-pay | Admitting: Internal Medicine

## 2014-08-28 VITALS — BP 128/78 | HR 80 | Temp 98.2°F | Ht 64.25 in | Wt 218.1 lb

## 2014-08-28 DIAGNOSIS — Z91048 Other nonmedicinal substance allergy status: Secondary | ICD-10-CM

## 2014-08-28 DIAGNOSIS — K219 Gastro-esophageal reflux disease without esophagitis: Secondary | ICD-10-CM

## 2014-08-28 DIAGNOSIS — E669 Obesity, unspecified: Secondary | ICD-10-CM

## 2014-08-28 DIAGNOSIS — Z0000 Encounter for general adult medical examination without abnormal findings: Secondary | ICD-10-CM

## 2014-08-28 DIAGNOSIS — Z1211 Encounter for screening for malignant neoplasm of colon: Secondary | ICD-10-CM

## 2014-08-28 DIAGNOSIS — J209 Acute bronchitis, unspecified: Secondary | ICD-10-CM

## 2014-08-28 DIAGNOSIS — E7800 Pure hypercholesterolemia, unspecified: Secondary | ICD-10-CM

## 2014-08-28 DIAGNOSIS — Z9109 Other allergy status, other than to drugs and biological substances: Secondary | ICD-10-CM

## 2014-08-28 DIAGNOSIS — K5790 Diverticulosis of intestine, part unspecified, without perforation or abscess without bleeding: Secondary | ICD-10-CM

## 2014-08-28 DIAGNOSIS — R739 Hyperglycemia, unspecified: Secondary | ICD-10-CM

## 2014-08-28 DIAGNOSIS — C50912 Malignant neoplasm of unspecified site of left female breast: Secondary | ICD-10-CM

## 2014-08-28 NOTE — Progress Notes (Signed)
Pre visit review using our clinic review tool, if applicable. No additional management support is needed unless otherwise documented below in the visit note. 

## 2014-08-28 NOTE — Progress Notes (Signed)
Patient ID: Robin Hill, female   DOB: 20-May-1950, 64 y.o.   MRN: [id number]   Subjective:    Patient ID: Robin Hill, female    DOB: [birth year], 64 y.o.   MRN: [id number]  HPI  Patient here to follow up on her current medical issues and for her physical exam.  Cough and congestion better.  Still some drainage and am congestion.  Clear in am.  No sob.  No cardiac symptoms with increased activity or exertion.  Breathing stable.  Eating and drinking well.  Bowels stable.  Doing water aerobics.  Also participates in [employer].     Past Medical History  Diagnosis Date  . Allergy   . Dysmenorrhea   . Hypercholesterolemia   . Breast cancer   . Diverticulosis     Outpatient Encounter Prescriptions as of 08/28/2014  Medication Sig  . Albuterol Sulfate (PROAIR HFA IN) 90 mcg. 2 puffs using inhaler four times a day as needed.  Marland Kitchen anastrozole (ARIMIDEX) 1 MG tablet Take 1 mg by mouth daily.  Marland Kitchen azelastine (ASTELIN) 137 MCG/SPRAY nasal spray 137 mcg. Spray 2 sprays into both nostrils twice a day  . Azelastine HCl (ASTEPRO) 0.15 % SOLN Spray 2 sprays into both nostrils once a day as needed (For allergies)  . b complex vitamins capsule Take 1 capsule by mouth daily.  . budesonide-formoterol (SYMBICORT) 160-4.5 MCG/ACT inhaler Inhale 2 puffs into the lungs 2 (two) times daily.  . Calcium Carb-Cholecalciferol (CALCIUM 600/VITAMIN D3) 600-800 MG-UNIT TABS Take by mouth daily.  . calcium carbonate (TUMS - DOSED IN MG ELEMENTAL CALCIUM) 500 MG chewable tablet Chew 1 tablet by mouth daily.  . Calcium Carbonate-Simethicone (TUMS PLUS PO) Take by mouth.  . cetirizine (ZYRTEC) 10 MG tablet Take 10 mg by mouth daily.  . cholecalciferol (VITAMIN D) 400 UNITS TABS Take 400 Units by mouth daily. Chewable  . Cyanocobalamin (VITAMIN B12 PO) Take 500 mcg by mouth daily. Administer 1 tablet by mouth once a day  . fluticasone (FLONASE) 50 MCG/ACT nasal spray Place 2 sprays into both nostrils daily. 1  spray into both nostrils twice a day  . levalbuterol (XOPENEX HFA) 45 MCG/ACT inhaler Inhale 2 puffs four times a day as needed  . loratadine (CLARITIN) 10 MG tablet Take 1 tablet by mouth once a day as needed  . montelukast (SINGULAIR) 10 MG tablet Take 1 tablet (10 mg total) by mouth at bedtime. 1 tablet once a day  . pregabalin (LYRICA) 75 MG capsule Take 150 mg by mouth 2 (two) times daily.  . rosuvastatin (CRESTOR) 10 MG tablet Take 1 tablet (10 mg total) by mouth daily.  . sodium chloride (OCEAN) 0.65 % nasal spray Place 1 spray into the nose as needed for congestion.  . vitamin E 400 UNIT capsule Take 400 Units by mouth daily.  . [DISCONTINUED] budesonide-formoterol (SYMBICORT) 160-4.5 MCG/ACT inhaler Inhale into the lungs.  . [DISCONTINUED] benzonatate (TESSALON) 100 MG capsule Take 1 capsule (100 mg total) by mouth 3 (three) times daily as needed for cough.  . [DISCONTINUED] Cholecalciferol (VITAMIN D-3 PO) Take by mouth.  . [DISCONTINUED] dextromethorphan (DELSYM) 30 MG/5ML liquid Take by mouth as needed for cough.  . [DISCONTINUED] levofloxacin (LEVAQUIN) 500 MG tablet Take 1 tablet (500 mg total) by mouth daily.  . [DISCONTINUED] predniSONE (DELTASONE) 10 MG tablet Take $RemoveBef'60mg'FHsFadgXRH$  by mouth on day 1, then taper by $Remove'10mg'CCUuJpF$  daily until gone   No facility-administered encounter medications on file as of 08/28/2014.  Review of Systems  Constitutional: Negative for appetite change and unexpected weight change.  HENT: Positive for congestion (better.  ). Negative for sinus pressure.   Eyes: Negative for pain and visual disturbance.  Respiratory: Negative for cough, chest tightness and shortness of breath.   Cardiovascular: Negative for chest pain, palpitations and leg swelling.  Gastrointestinal: Negative for nausea, vomiting, abdominal pain and diarrhea.  Genitourinary: Negative for dysuria and difficulty urinating.  Musculoskeletal: Negative for back pain and joint swelling.  Skin: Negative  for color change and rash.  Neurological: Negative for dizziness, light-headedness and headaches.  Hematological: Negative for adenopathy. Does not bruise/bleed easily.  Psychiatric/Behavioral: Negative for dysphoric mood and agitation.       Objective:     Blood pressure recheck:  128/78-80  Physical Exam  Constitutional: She is oriented to person, place, and time. She appears well-developed and well-nourished. No distress.  HENT:  Nose: Nose normal.  Mouth/Throat: Oropharynx is clear and moist.  Eyes: Right eye exhibits no discharge. Left eye exhibits no discharge. No scleral icterus.  Neck: Neck supple. No thyromegaly present.  Cardiovascular: Normal rate and regular rhythm.   Pulmonary/Chest: Breath sounds normal. No respiratory distress. She has no wheezes.  S/p left mastectomy.  No nipple discharge or nipple retraction present - right breast.  Could not appreciate any adnexal masses or tenderness.    Abdominal: Soft. Bowel sounds are normal. There is no tenderness.  Musculoskeletal: She exhibits no edema or tenderness.  Lymphadenopathy:    She has no cervical adenopathy.  Neurological: She is alert and oriented to person, place, and time.  Skin: Skin is warm and dry. No rash noted. No erythema.  Psychiatric: She has a normal mood and affect. Her behavior is normal.    BP 128/78 mmHg  Pulse 80  Temp(Src) 98.2 F (36.8 C) (Oral)  Ht 5' 4.25" (1.632 m)  Wt 218 lb 2 oz (98.941 kg)  BMI 37.15 kg/m2  SpO2 96% Wt Readings from Last 3 Encounters:  08/28/14 218 lb 2 oz (98.941 kg)  08/05/14 219 lb 2 oz (99.394 kg)  07/31/14 218 lb 6 oz (99.054 kg)     Lab Results  Component Value Date   WBC 6.2 12/24/2013   HGB 12.5 12/24/2013   HCT 37.6 12/24/2013   PLT 181 12/24/2013   GLUCOSE 96 01/14/2014   CHOL 190 01/14/2014   TRIG 72.0 01/14/2014   HDL 66.70 01/14/2014   LDLDIRECT 126.2 05/09/2013   LDLCALC 109* 01/14/2014   ALT 26 01/14/2014   AST 32 01/14/2014   NA 140  01/14/2014   K 4.2 01/14/2014   CL 103 01/14/2014   CREATININE 1.0 01/14/2014   BUN 12 01/14/2014   CO2 28 01/14/2014   TSH 3.75 10/29/2013   HGBA1C 6.1 01/14/2014   MICROALBUR 0.2 05/09/2013       Assessment & Plan:   Problem List Items Addressed This Visit    Acute bronchitis    Improved.  Follow.  Refilled flonase and astelin.        Breast cancer    Followed by Dr Ma Hillock and Dr Tamala Julian.  Has f/u planned in 12/2014.  Up to date.  Obtain copy of last mammogram.        Diverticulosis    Bowels doing well.  09/17/05 colonoscopy.        Environmental allergies    Doing relatively well.  Recently treated for acute infection.  Follow.       GERD (gastroesophageal reflux  disease)    No upper GI symptoms reported.  Follow.       Relevant Medications   Calcium Carbonate-Simethicone (TUMS PLUS PO)   Health care maintenance    Physical 08/28/14.  PAP 05/14/13 - negative with negative HPV.  Colonoscopy 09/17/05 - hemorrhoids and diverticulosis.  IFOB given.  Mammogram through Dr Ma Hillock.        Hypercholesterolemia    On crestor.  Low cholesterol diet and exercise.  Follow lipid panel and liver function tests.        Relevant Orders   Lipid panel   Hepatic function panel   Hyperglycemia    Low carb diet and exercise.  Follow met b and a1c.       Relevant Orders   TSH   Hemoglobin B3I   Basic metabolic panel   Microalbumin / creatinine urine ratio   Obesity    Diet and exercise.        Relevant Medications   Calcium Carbonate-Simethicone (TUMS PLUS PO)    Other Visit Diagnoses    Colon cancer screening    -  Primary    Relevant Orders    Fecal occult blood, imunochemical (Completed)      I spent 25 minutes with the patient and more than 50% of the time was spent in consultation regarding the above.     Einar Pheasant, MD

## 2014-08-30 ENCOUNTER — Ambulatory Visit
Admit: 2014-08-30 | Disposition: A | Discharge status: Home / Self Care | Payer: Self-pay | Attending: Internal Medicine | Admitting: Internal Medicine

## 2014-08-30 DIAGNOSIS — Z853 Personal history of malignant neoplasm of breast: Secondary | ICD-10-CM | POA: Diagnosis not present

## 2014-08-30 DIAGNOSIS — Z1231 Encounter for screening mammogram for malignant neoplasm of breast: Secondary | ICD-10-CM | POA: Diagnosis not present

## 2014-08-30 DIAGNOSIS — Z9012 Acquired absence of left breast and nipple: Secondary | ICD-10-CM | POA: Diagnosis not present

## 2014-08-30 LAB — HM MAMMOGRAPHY: HM Mammogram: NORMAL

## 2014-09-02 DIAGNOSIS — H2513 Age-related nuclear cataract, bilateral: Secondary | ICD-10-CM | POA: Diagnosis not present

## 2014-09-03 ENCOUNTER — Other Ambulatory Visit (INDEPENDENT_AMBULATORY_CARE_PROVIDER_SITE_OTHER): Payer: Medicare Other

## 2014-09-03 DIAGNOSIS — Z1211 Encounter for screening for malignant neoplasm of colon: Secondary | ICD-10-CM

## 2014-09-03 LAB — FECAL OCCULT BLOOD, IMMUNOCHEMICAL: Fecal Occult Bld: POSITIVE — AB

## 2014-09-04 ENCOUNTER — Other Ambulatory Visit: Payer: Self-pay | Admitting: Internal Medicine

## 2014-09-04 ENCOUNTER — Encounter: Payer: Self-pay | Admitting: Internal Medicine

## 2014-09-04 DIAGNOSIS — R195 Other fecal abnormalities: Secondary | ICD-10-CM

## 2014-09-04 NOTE — Progress Notes (Signed)
Order placed for GI referral.   

## 2014-09-08 ENCOUNTER — Encounter: Payer: Self-pay | Admitting: Internal Medicine

## 2014-09-08 DIAGNOSIS — Z0000 Encounter for general adult medical examination without abnormal findings: Secondary | ICD-10-CM | POA: Insufficient documentation

## 2014-09-08 DIAGNOSIS — R739 Hyperglycemia, unspecified: Secondary | ICD-10-CM | POA: Insufficient documentation

## 2014-09-08 NOTE — Assessment & Plan Note (Signed)
Diet and exercise.   

## 2014-09-08 NOTE — Assessment & Plan Note (Signed)
Low carb diet and exercise.  Follow met b and a1c.

## 2014-09-08 NOTE — Assessment & Plan Note (Addendum)
Physical 08/28/14.  PAP 05/14/13 - negative with negative HPV.  Colonoscopy 09/17/05 - hemorrhoids and diverticulosis.  IFOB given.  Mammogram through Dr Ma Hillock.

## 2014-09-08 NOTE — Assessment & Plan Note (Signed)
Doing relatively well.  Recently treated for acute infection.  Follow.

## 2014-09-08 NOTE — Assessment & Plan Note (Signed)
Followed by Dr Ma Hillock and Dr Tamala Julian.  Has f/u planned in 12/2014.  Up to date.  Obtain copy of last mammogram.

## 2014-09-08 NOTE — Assessment & Plan Note (Signed)
No upper GI symptoms reported.  Follow.   

## 2014-09-08 NOTE — Assessment & Plan Note (Signed)
Improved.  Follow.  Refilled flonase and astelin.

## 2014-09-08 NOTE — Assessment & Plan Note (Signed)
On crestor.  Low cholesterol diet and exercise.  Follow lipid panel and liver function tests.   

## 2014-09-08 NOTE — Assessment & Plan Note (Signed)
Bowels doing well.  09/17/05 colonoscopy.

## 2014-09-09 ENCOUNTER — Other Ambulatory Visit (INDEPENDENT_AMBULATORY_CARE_PROVIDER_SITE_OTHER): Payer: Medicare Other

## 2014-09-09 DIAGNOSIS — E7800 Pure hypercholesterolemia, unspecified: Secondary | ICD-10-CM

## 2014-09-09 DIAGNOSIS — R739 Hyperglycemia, unspecified: Secondary | ICD-10-CM

## 2014-09-09 LAB — LIPID PANEL
Cholesterol: 187 mg/dL (ref 0–200)
HDL: 69.3 mg/dL (ref >39.00)
LDL CALC: 101 mg/dL — AB (ref 0–99)
NONHDL: 117.7
Total CHOL/HDL Ratio: 3
Triglycerides: 86 mg/dL (ref 0.0–149.0)
VLDL: 17.2 mg/dL (ref 0.0–40.0)

## 2014-09-09 LAB — MICROALBUMIN / CREATININE URINE RATIO
CREATININE, U: 149.3 mg/dL
Microalb Creat Ratio: 0.5 mg/g (ref 0.0–30.0)
Microalb, Ur: <0.7 mg/dL (ref 0.0–1.9)

## 2014-09-09 LAB — HEPATIC FUNCTION PANEL
ALT: 17 U/L (ref 0–35)
AST: 22 U/L (ref 0–37)
Albumin: 3.9 g/dL (ref 3.5–5.2)
Alkaline Phosphatase: 67 U/L (ref 39–117)
Bilirubin, Direct: 0.1 mg/dL (ref 0.0–0.3)
Total Bilirubin: 0.5 mg/dL (ref 0.2–1.2)
Total Protein: 6.7 g/dL (ref 6.0–8.3)

## 2014-09-09 LAB — BASIC METABOLIC PANEL
BUN: 19 mg/dL (ref 6–23)
CO2: 30 mEq/L (ref 19–32)
CREATININE: 0.82 mg/dL (ref 0.40–1.20)
Calcium: 9.7 mg/dL (ref 8.4–10.5)
Chloride: 106 mEq/L (ref 96–112)
GFR: 90.11 mL/min (ref >60.00)
Glucose, Bld: 98 mg/dL (ref 70–99)
POTASSIUM: 4.8 meq/L (ref 3.5–5.1)
Sodium: 141 mEq/L (ref 135–145)

## 2014-09-09 LAB — HEMOGLOBIN A1C: HEMOGLOBIN A1C: 6.2 % (ref 4.6–6.5)

## 2014-09-09 LAB — TSH: TSH: 1.9 u[IU]/mL (ref 0.35–4.50)

## 2014-09-09 NOTE — Telephone Encounter (Signed)
Pt stopped by my desk today & mentioned that her Rx was faxed over without a coversheet. Rx was refaxed with coversheet this morning & patient aware.

## 2014-09-10 ENCOUNTER — Encounter: Payer: Self-pay | Admitting: *Deleted

## 2014-09-12 ENCOUNTER — Telehealth: Payer: Self-pay | Admitting: *Deleted

## 2014-09-12 NOTE — Telephone Encounter (Signed)
Rx's refaxed with DEA & license number

## 2014-10-08 DIAGNOSIS — R195 Other fecal abnormalities: Secondary | ICD-10-CM | POA: Diagnosis not present

## 2014-11-15 ENCOUNTER — Encounter: Admission: RE | Payer: Self-pay | Source: Ambulatory Visit

## 2014-11-15 ENCOUNTER — Ambulatory Visit
Admission: RE | Admit: 2014-11-15 | Payer: Medicare Other | Source: Ambulatory Visit | Admitting: Unknown Physician Specialty

## 2014-11-15 SURGERY — COLONOSCOPY WITH PROPOFOL
Anesthesia: General

## 2014-11-25 ENCOUNTER — Encounter: Admission: RE | Payer: Self-pay | Source: Ambulatory Visit

## 2014-11-25 ENCOUNTER — Ambulatory Visit
Admission: RE | Admit: 2014-11-25 | Payer: Medicare Other | Source: Ambulatory Visit | Admitting: Unknown Physician Specialty

## 2014-11-25 SURGERY — COLONOSCOPY WITH PROPOFOL
Anesthesia: General

## 2014-12-19 ENCOUNTER — Telehealth: Payer: Self-pay | Admitting: *Deleted

## 2014-12-19 NOTE — Telephone Encounter (Signed)
Pt wanted a refill of lyrica faxed into [facility].  She will get one more refill before she has to fill out new paper work to see if she qualifies again.  Got prescription signed and found fax # for [place] and sent the rx today 8/18.

## 2014-12-27 ENCOUNTER — Inpatient Hospital Stay (HOSPITAL_BASED_OUTPATIENT_CLINIC_OR_DEPARTMENT_OTHER): Payer: Medicare Other | Admitting: Internal Medicine

## 2014-12-27 ENCOUNTER — Encounter (INDEPENDENT_AMBULATORY_CARE_PROVIDER_SITE_OTHER): Payer: Self-pay

## 2014-12-27 ENCOUNTER — Inpatient Hospital Stay: Payer: Medicare Other | Attending: Internal Medicine

## 2014-12-27 VITALS — BP 129/79 | HR 79 | Temp 97.1°F | Resp 18 | Ht 64.25 in | Wt 214.7 lb

## 2014-12-27 DIAGNOSIS — Z853 Personal history of malignant neoplasm of breast: Secondary | ICD-10-CM | POA: Insufficient documentation

## 2014-12-27 DIAGNOSIS — Z9221 Personal history of antineoplastic chemotherapy: Secondary | ICD-10-CM | POA: Diagnosis not present

## 2014-12-27 DIAGNOSIS — Z9012 Acquired absence of left breast and nipple: Secondary | ICD-10-CM | POA: Insufficient documentation

## 2014-12-27 DIAGNOSIS — Z170 Estrogen receptor positive status [ER+]: Secondary | ICD-10-CM

## 2014-12-27 DIAGNOSIS — E785 Hyperlipidemia, unspecified: Secondary | ICD-10-CM | POA: Insufficient documentation

## 2014-12-27 DIAGNOSIS — Z87891 Personal history of nicotine dependence: Secondary | ICD-10-CM | POA: Diagnosis not present

## 2014-12-27 DIAGNOSIS — D0590 Unspecified type of carcinoma in situ of unspecified breast: Secondary | ICD-10-CM | POA: Insufficient documentation

## 2014-12-27 DIAGNOSIS — Z79811 Long term (current) use of aromatase inhibitors: Secondary | ICD-10-CM

## 2014-12-27 DIAGNOSIS — K5790 Diverticulosis of intestine, part unspecified, without perforation or abscess without bleeding: Secondary | ICD-10-CM | POA: Diagnosis not present

## 2014-12-27 DIAGNOSIS — G629 Polyneuropathy, unspecified: Secondary | ICD-10-CM | POA: Diagnosis not present

## 2014-12-27 DIAGNOSIS — Z79899 Other long term (current) drug therapy: Secondary | ICD-10-CM

## 2014-12-27 DIAGNOSIS — I972 Postmastectomy lymphedema syndrome: Secondary | ICD-10-CM | POA: Diagnosis not present

## 2014-12-27 DIAGNOSIS — Z923 Personal history of irradiation: Secondary | ICD-10-CM | POA: Insufficient documentation

## 2014-12-27 DIAGNOSIS — C50912 Malignant neoplasm of unspecified site of left female breast: Secondary | ICD-10-CM

## 2014-12-27 LAB — HEPATIC FUNCTION PANEL
ALBUMIN: 4.2 g/dL (ref 3.5–5.0)
ALT: 21 U/L (ref 14–54)
AST: 31 U/L (ref 15–41)
Alkaline Phosphatase: 63 U/L (ref 38–126)
TOTAL PROTEIN: 7.4 g/dL (ref 6.5–8.1)
Total Bilirubin: 0.7 mg/dL (ref 0.3–1.2)

## 2014-12-27 LAB — CBC WITH DIFFERENTIAL/PLATELET
BASOS PCT: 1 %
Basophils Absolute: 0.1 10*3/uL (ref 0–0.1)
Eosinophils Absolute: 0.2 10*3/uL (ref 0–0.7)
Eosinophils Relative: 3 %
HCT: 37.2 % (ref 35.0–47.0)
HEMOGLOBIN: 12.7 g/dL (ref 12.0–16.0)
Lymphocytes Relative: 29 %
Lymphs Abs: 1.8 10*3/uL (ref 1.0–3.6)
MCH: 30.2 pg (ref 26.0–34.0)
MCHC: 34.1 g/dL (ref 32.0–36.0)
MCV: 88.6 fL (ref 80.0–100.0)
Monocytes Absolute: 0.5 10*3/uL (ref 0.2–0.9)
Monocytes Relative: 8 %
NEUTROS PCT: 59 %
Neutro Abs: 3.6 10*3/uL (ref 1.4–6.5)
Platelets: 172 10*3/uL (ref 150–440)
RBC: 4.2 MIL/uL (ref 3.80–5.20)
RDW: 13.1 % (ref 11.5–14.5)
WBC: 6.1 10*3/uL (ref 3.6–11.0)

## 2014-12-27 LAB — CREATININE, SERUM
Creatinine, Ser: 0.97 mg/dL (ref 0.44–1.00)
GFR calc Af Amer: >60 mL/min (ref >60)

## 2014-12-27 NOTE — Progress Notes (Signed)
Patient is here for follow-up of breast cancer. Patient states that overall she has been doing good. She still has trouble with neuropathy in her feet and hands.

## 2014-12-30 ENCOUNTER — Other Ambulatory Visit: Payer: Self-pay | Admitting: *Deleted

## 2014-12-30 ENCOUNTER — Other Ambulatory Visit: Payer: Self-pay

## 2014-12-30 DIAGNOSIS — C50912 Malignant neoplasm of unspecified site of left female breast: Secondary | ICD-10-CM

## 2015-01-13 ENCOUNTER — Other Ambulatory Visit: Payer: Self-pay | Admitting: Internal Medicine

## 2015-01-13 NOTE — Progress Notes (Signed)
[facility]  [phone] [phone]     ID: Robin Hill OB: [birth year]  MR#: [id number]  DTO#:[id number]  Patient Care Team: Einar Pheasant, MD as PCP - General (Internal Medicine)  CHIEF COMPLAINT/DIAGNOSIS:  1. T1c N1 (mi) M0 (clinical) infiltrating ductal carcinoma with lobular features of the left breast status post lumpectomy and sentinel nodes study on December 22, 2009. Tumor size 1.9 cm, grade 1. Micrometastasis in one sentinel lymph node. ER/PR positive.  HER-2/neu not over expressed. Patient got adjuvant chemotherapy, completed AC x 4, then got weekly Paclitaxel x 10 doses (completed 06/30/10, stopped due to peripheral neuropathy).  Then got radiation. Started anastrozole in May 2012.  2. Left breast DCIS August 2012 despite receiving radiation.  Underwent simple mastectomy 02/02/2011, pathology reported residual DCIS 1.5 cm, grade 2-3, margins negative. Also had right breast mass excision on 02/02/11 which was reported benign.      HISTORY OF PRESENT ILLNESS:  Patient returns for continued oncology followup, she was seen one year ago. States that she is doing steady. States that chronic numbness and tingling on bottom of feet is same, and she is on Lyrica 150 mg BID. Denies new complaints or side effects from anastrazole. No progressive swelling in left upper extremity.  No new bone pains.  Appetite is good. Denies feeling any new breast masses on self-exam. No new pain issues. No new mood disturbances.  REVIEW OF SYSTEMS:   ROS As in HPI above. In addition, no fever, chills or sweats. No new headaches or focal weakness.  No sore throat, cough, shortness of breath, sputum, hemoptysis or chest pain. No dizziness or palpitation. No abdominal pain, constipation, diarrhea, dysuria or hematuria. No new skin rash or bleeding symptoms. No new paresthesias in extremities.   PAST MEDICAL HISTORY: Reviewed. Past Medical History  Diagnosis Date  .  Allergy   . Dysmenorrhea   . Hypercholesterolemia   . Breast cancer   . Diverticulosis           Hyperlipidemia  Allergies  Sinus surgery 2003  Cesarean section x3  T1c N1 (mi) infiltrating ductal carcinoma with lobular features of left breast status post lumpectomy and sentinel nodes study on 12/22/09, Tumor size 1.9 cm, Micrometastasis in one sentinel lymph node. ER/PR positive.  HER-2/neu not over expressed.   PAST SURGICAL HISTORY: Reviewed. Past Surgical History  Procedure Laterality Date  . Nasal sinus surgery  07/2001  . Cesarean section  1970  . Cesarean section  1973  . Cesarean section  1987  . Breast surgery  2011    breast cancer    FAMILY HISTORY: Reviewed. Family History  Problem Relation Age of Onset  . Cirrhosis Brother     liver  . Cancer Paternal Aunt     2 aunts with breat cancer    SOCIAL HISTORY: Reviewed. Social History  Substance Use Topics  . Smoking status: Former Smoker    Types: Cigarettes  . Smokeless tobacco: Never Used  . Alcohol Use: No    No Known Allergies  Current Outpatient Prescriptions  Medication Sig Dispense Refill  . Albuterol Sulfate (PROAIR HFA IN) 90 mcg. 2 puffs using inhaler four times a day as needed.    Marland Kitchen anastrozole (ARIMIDEX) 1 MG tablet Take 1 mg by mouth daily.    Marland Kitchen azelastine (ASTELIN) 137 MCG/SPRAY nasal spray 137 mcg. Spray 2 sprays into both nostrils twice a day    . Azelastine HCl (ASTEPRO) 0.15 % SOLN Spray 2 sprays  into both nostrils once a day as needed (For allergies)    . b complex vitamins capsule Take 1 capsule by mouth daily.    . budesonide-formoterol (SYMBICORT) 160-4.5 MCG/ACT inhaler Inhale 2 puffs into the lungs 2 (two) times daily. 3 Inhaler 1  . Calcium Carb-Cholecalciferol (CALCIUM 600/VITAMIN D3) 600-800 MG-UNIT TABS Take by mouth daily.    . calcium carbonate (TUMS - DOSED IN MG ELEMENTAL CALCIUM) 500 MG chewable tablet Chew 1 tablet by mouth daily.    . Calcium Carbonate-Simethicone (TUMS  PLUS PO) Take by mouth.    . cetirizine (ZYRTEC) 10 MG tablet Take 10 mg by mouth daily.    . cholecalciferol (VITAMIN D) 400 UNITS TABS Take 400 Units by mouth daily. Chewable    . Cyanocobalamin (VITAMIN B12 PO) Take 500 mcg by mouth daily. Administer 1 tablet by mouth once a day    . fluticasone (FLONASE) 50 MCG/ACT nasal spray Place 2 sprays into both nostrils daily. 1 spray into both nostrils twice a day 16 g 5  . levalbuterol (XOPENEX HFA) 45 MCG/ACT inhaler Inhale 2 puffs four times a day as needed    . loratadine (CLARITIN) 10 MG tablet Take 1 tablet by mouth once a day as needed    . montelukast (SINGULAIR) 10 MG tablet Take 1 tablet (10 mg total) by mouth at bedtime. 1 tablet once a day 30 tablet 5  . pregabalin (LYRICA) 75 MG capsule Take 150 mg by mouth 2 (two) times daily.    . rosuvastatin (CRESTOR) 10 MG tablet Take 1 tablet (10 mg total) by mouth daily. 90 tablet 1  . sodium chloride (OCEAN) 0.65 % nasal spray Place 1 spray into the nose as needed for congestion.    . vitamin E 400 UNIT capsule Take 400 Units by mouth daily.     No current facility-administered medications for this visit.    PHYSICAL EXAM: Filed Vitals:   12/27/14 1505  BP: 129/79  Pulse: 79  Temp: 97.1 F (36.2 C)  Resp: 18     Body mass index is 36.57 kg/(m^2).    ECOG FS:0 - Asymptomatic  GENERAL: Patient is alert and oriented and in no acute distress. There is no icterus. HEENT: EOMs intact. Oral exam negative for thrush or lesions. No cervical lymphadenopathy. CVS: S1S2, regular LUNGS: Bilaterally clear to auscultation, no rhonchi. ABDOMEN: Soft, nontender. No hepatomegaly clinically.  EXTREMITIES: No pedal edema. BREASTS: no abnormal masses in left mastectomy scar. No abnormal masses palpable in right breast. No axillary adenopathy. Exam performed in presence of a nurse.   LAB RESULTS:    Component Value Date/Time   NA 141 09/09/2014 0923   NA 142 11/03/2011   K 4.8 09/09/2014 0923   CL  106 09/09/2014 0923   CO2 30 09/09/2014 0923   GLUCOSE 98 09/09/2014 0923   BUN 19 09/09/2014 0923   BUN 14 11/03/2011   CREATININE 0.97 12/27/2014 1439   CREATININE 1.04 12/18/2012 1406   CREATININE 0.9 11/03/2011   CALCIUM 9.7 09/09/2014 0923   PROT 7.4 12/27/2014 1439   PROT 7.0 12/18/2012 1406   ALBUMIN 4.2 12/27/2014 1439   ALBUMIN 3.7 12/18/2012 1406   AST 31 12/27/2014 1439   AST 23 12/18/2012 1406   ALT 21 12/27/2014 1439   ALT 32 12/18/2012 1406   ALKPHOS 63 12/27/2014 1439   ALKPHOS 91 12/18/2012 1406   BILITOT 0.7 12/27/2014 1439   BILITOT 0.6 12/18/2012 1406   GFRNONAA >60 12/27/2014 1439  GFRNONAA 58* 12/18/2012 1406   GFRNONAA 57* 08/03/2011 0950   GFRAA >60 12/27/2014 1439   GFRAA >60 12/18/2012 1406   GFRAA >60 08/03/2011 0950    Lab Results  Component Value Date   WBC 6.1 12/27/2014   NEUTROABS 3.6 12/27/2014   HGB 12.7 12/27/2014   HCT 37.2 12/27/2014   MCV 88.6 12/27/2014   PLT 172 12/27/2014    STUDIES: 08/30/14 - Mammogram. IMPRESSION:  No mammographic evidence of malignancy. RECOMMENDATION: Screening mammogram in one year. (Code:[id number]).  BI-RADS CATEGORY  1: Negative.    ASSESSMENT / PLAN:   1. T1c N1 (mi) cM0 infiltrating ductal carcinoma of the left breast s/p lumpectomy and sentinel nodes study in August 2011.  Then had recurrent DCIS in left breast despite radiation and underwent simple mastectomy 02/02/2011  - reviewed labs from today and discussed with patient. She is  doing steady with no clinical evidence to suggest recurrent or metastatic breast cancer.  Symptoms of lymphedema in the left axilla and upper extremity is better since attending lymphedema clinic. Recent mammogram in April was reported unremarkable, BIRADS-1.  Plan is to continue on Anastrozole 1 mg by mouth daily.  She was also advised to continue calcium and vitamin D supplement for osteoporosis prevention. DEXA scan in August 2014 was unremarkable. Otherwise will get  surveillance right mammogram in May 2017, and followup in 12 months with CBC, Cr, LFT. 2.  Peripheral neuropathy, chronic - overall unchanged, continue lyrica. She also has been seen by Neurologist. 3. In between visits, the patient has been advised to call in case of any new breast masses on self-exam, new side effects from anastrozole or other symptoms and will be evaluated sooner.  She is agreeable to this plan.    Leia Alf, MD   01/13/2015 11:10 PM

## 2015-01-14 NOTE — Telephone Encounter (Signed)
Please call pt.  This is not a medication to refill.  If having problems, needs to be evaluated.

## 2015-01-14 NOTE — Telephone Encounter (Signed)
Last OV 4.27.16.  Please advise refill 

## 2015-01-14 NOTE — Telephone Encounter (Signed)
Left message on VM to return call 

## 2015-02-24 ENCOUNTER — Encounter: Payer: Self-pay | Admitting: Internal Medicine

## 2015-02-24 ENCOUNTER — Ambulatory Visit (INDEPENDENT_AMBULATORY_CARE_PROVIDER_SITE_OTHER): Payer: Medicare Other | Admitting: Internal Medicine

## 2015-02-24 VITALS — BP 120/60 | HR 86 | Temp 98.2°F | Resp 18 | Ht 64.25 in | Wt 214.5 lb

## 2015-02-24 DIAGNOSIS — Z91048 Other nonmedicinal substance allergy status: Secondary | ICD-10-CM

## 2015-02-24 DIAGNOSIS — K219 Gastro-esophageal reflux disease without esophagitis: Secondary | ICD-10-CM

## 2015-02-24 DIAGNOSIS — E7800 Pure hypercholesterolemia, unspecified: Secondary | ICD-10-CM

## 2015-02-24 DIAGNOSIS — R519 Headache, unspecified: Secondary | ICD-10-CM

## 2015-02-24 DIAGNOSIS — E669 Obesity, unspecified: Secondary | ICD-10-CM

## 2015-02-24 DIAGNOSIS — R51 Headache: Secondary | ICD-10-CM

## 2015-02-24 DIAGNOSIS — C50912 Malignant neoplasm of unspecified site of left female breast: Secondary | ICD-10-CM | POA: Diagnosis not present

## 2015-02-24 DIAGNOSIS — Z9109 Other allergy status, other than to drugs and biological substances: Secondary | ICD-10-CM

## 2015-02-24 DIAGNOSIS — R739 Hyperglycemia, unspecified: Secondary | ICD-10-CM

## 2015-02-24 MED ORDER — PANTOPRAZOLE SODIUM 40 MG PO TBEC: 40.0000 mg | DELAYED_RELEASE_TABLET | Freq: Every day | ORAL | Status: DC

## 2015-02-24 NOTE — Patient Instructions (Signed)
prevnar 

## 2015-02-24 NOTE — Progress Notes (Signed)
Patient ID: Robin Hill, female   DOB: 1950/05/12, 64 y.o.   MRN: [id number]   Subjective:    Patient ID: Robin Hill, female    DOB: 02-22-1951, 64 y.o.   MRN: [id number]  HPI  Patient with past history of breast cancer, allergies and hypercholesterolemia who comes in today to follow up on these issues.  She reports she is doing well.  Saw Dr Gustavo Lah 10/2014.  No abdominal pain or cramping.  Bowels stable.  She saw Dr Ma Hillock 12/28/14.  Last mammogram 08/30/14 - Birads I.  Tries to stay active.  No cardiac symptoms with increased activity or exertion.  No sob.  No significant acid reflux.  On protonix.  No swallowing problems.  Some occasional head pain.   Right side.  Not persistent.  No significant headache.  No dizziness or light headedness.    Past Medical History  Diagnosis Date  . Allergy   . Dysmenorrhea   . Hypercholesterolemia   . Breast cancer ([place])   . Diverticulosis    Past Surgical History  Procedure Laterality Date  . Nasal sinus surgery  07/2001  . Cesarean section  1970  . Cesarean section  1973  . Cesarean section  1987  . Breast surgery  2011    breast cancer   Family History  Problem Relation Age of Onset  . Cirrhosis Brother     liver  . Cancer Paternal Aunt     2 aunts with breat cancer   Social History   Social History  . Marital Status: Divorced    Spouse Name: N/A  . Number of Children: N/A  . Years of Education: N/A   Social History Main Topics  . Smoking status: Former Smoker    Types: Cigarettes  . Smokeless tobacco: Never Used  . Alcohol Use: No  . Drug Use: No  . Sexual Activity: Not Asked   Other Topics Concern  . None   Social History Narrative    Outpatient Encounter Prescriptions as of 02/24/2015  Medication Sig  . Albuterol Sulfate (PROAIR HFA IN) 90 mcg. 2 puffs using inhaler four times a day as needed.  Marland Kitchen anastrozole (ARIMIDEX) 1 MG tablet Take 1 mg by mouth daily.  Marland Kitchen azelastine (ASTELIN) 137 MCG/SPRAY nasal spray  137 mcg. Spray 2 sprays into both nostrils twice a day  . Azelastine HCl (ASTEPRO) 0.15 % SOLN Spray 2 sprays into both nostrils once a day as needed (For allergies)  . b complex vitamins capsule Take 1 capsule by mouth daily.  . budesonide-formoterol (SYMBICORT) 160-4.5 MCG/ACT inhaler Inhale 2 puffs into the lungs 2 (two) times daily.  . Calcium Carb-Cholecalciferol (CALCIUM 600/VITAMIN D3) 600-800 MG-UNIT TABS Take by mouth daily.  . calcium carbonate (TUMS - DOSED IN MG ELEMENTAL CALCIUM) 500 MG chewable tablet Chew 1 tablet by mouth daily.  . Calcium Carbonate-Simethicone (TUMS PLUS PO) Take by mouth.  . cetirizine (ZYRTEC) 10 MG tablet Take 10 mg by mouth daily.  . cholecalciferol (VITAMIN D) 400 UNITS TABS Take 400 Units by mouth daily. Chewable  . Cyanocobalamin (VITAMIN B12 PO) Take 500 mcg by mouth daily. Administer 1 tablet by mouth once a day  . fluticasone (FLONASE) 50 MCG/ACT nasal spray Place 2 sprays into both nostrils daily. 1 spray into both nostrils twice a day  . levalbuterol (XOPENEX HFA) 45 MCG/ACT inhaler Inhale 2 puffs four times a day as needed  . loratadine (CLARITIN) 10 MG tablet Take 1 tablet by mouth once a  day as needed  . montelukast (SINGULAIR) 10 MG tablet Take 1 tablet (10 mg total) by mouth at bedtime. 1 tablet once a day  . pregabalin (LYRICA) 75 MG capsule Take 150 mg by mouth 2 (two) times daily.  . rosuvastatin (CRESTOR) 10 MG tablet Take 1 tablet (10 mg total) by mouth daily.  . sodium chloride (OCEAN) 0.65 % nasal spray Place 1 spray into the nose as needed for congestion.  . vitamin E 400 UNIT capsule Take 400 Units by mouth daily.  . pantoprazole (PROTONIX) 40 MG tablet Take 1 tablet (40 mg total) by mouth daily.   No facility-administered encounter medications on file as of 02/24/2015.    Review of Systems  Constitutional: Negative for appetite change and unexpected weight change.  HENT: Negative for congestion and sinus pressure.   Eyes: Negative  for discharge and visual disturbance.  Respiratory: Negative for cough, chest tightness and shortness of breath.   Cardiovascular: Negative for chest pain, palpitations and leg swelling.  Gastrointestinal: Negative for nausea, vomiting, abdominal pain and diarrhea.  Genitourinary: Negative for dysuria and difficulty urinating.  Musculoskeletal: Negative for back pain and joint swelling.  Skin: Negative for color change and rash.  Neurological: Negative for dizziness, light-headedness and headaches.       Occasional head pain as outlined.    Psychiatric/Behavioral: Negative for dysphoric mood and agitation.       Objective:    Physical Exam  Constitutional: She appears well-developed and well-nourished. No distress.  HENT:  Nose: Nose normal.  Mouth/Throat: Oropharynx is clear and moist.  No rash.  No pain to palpation over the scalp/head.    Eyes: Conjunctivae are normal. Right eye exhibits no discharge. Left eye exhibits no discharge.  Neck: Neck supple. No thyromegaly present.  Cardiovascular: Normal rate and regular rhythm.   Pulmonary/Chest: Breath sounds normal. No respiratory distress. She has no wheezes.  Abdominal: Soft. Bowel sounds are normal. There is no tenderness.  Musculoskeletal: She exhibits no edema or tenderness.  Lymphadenopathy:    She has no cervical adenopathy.  Skin: No rash noted. No erythema.  Psychiatric: She has a normal mood and affect. Her behavior is normal.    BP 120/60 mmHg  Pulse 86  Temp(Src) 98.2 F (36.8 C) (Oral)  Resp 18  Ht 5' 4.25" (1.632 m)  Wt 214 lb 8 oz (97.297 kg)  BMI 36.53 kg/m2  SpO2 98% Wt Readings from Last 3 Encounters:  02/24/15 214 lb 8 oz (97.297 kg)  12/27/14 214 lb 11.7 oz (97.401 kg)  08/28/14 218 lb 2 oz (98.941 kg)     Lab Results  Component Value Date   WBC 6.1 12/27/2014   HGB 12.7 12/27/2014   HCT 37.2 12/27/2014   PLT 172 12/27/2014   GLUCOSE 98 09/09/2014   CHOL 187 09/09/2014   TRIG 86.0  09/09/2014   HDL 69.30 09/09/2014   LDLDIRECT 126.2 05/09/2013   LDLCALC 101* 09/09/2014   ALT 21 12/27/2014   AST 31 12/27/2014   NA 141 09/09/2014   K 4.8 09/09/2014   CL 106 09/09/2014   CREATININE 0.97 12/27/2014   BUN 19 09/09/2014   CO2 30 09/09/2014   TSH 1.90 09/09/2014   HGBA1C 6.2 09/09/2014   MICROALBUR <0.7 09/09/2014    Dg Tomography  08/30/2014  CLINICAL DATA:  Screening. EXAM: DIGITAL SCREENING UNILATERAL RIGHT MAMMOGRAM WITH TOMO AND CAD COMPARISON:  Previous exam(s). ACR Breast Density Category b: There are scattered areas of fibroglandular density. FINDINGS: The  patient has had a left mastectomy. There are no findings suspicious for malignancy. Images were processed with CAD. IMPRESSION: No mammographic evidence of malignancy. A result letter of this screening mammogram will be mailed directly to the patient. RECOMMENDATION: Screening mammogram in one year. (Code:SM-B-01Y) BI-RADS CATEGORY  1: Negative. Electronically Signed   By: Margarette Canada M.D.   On: 08/30/2014 16:50       Assessment & Plan:   Problem List Items Addressed This Visit    Breast cancer [facility])    Followed by Dr Ma Hillock and Dr Tamala Julian.  Mammogram 08/30/14 - Bir[facility] I.  Saw Dr Ma Hillock - 12/2014.        Environmental allergies    Doing well on current regimen.  Follow.        GERD (gastroesophageal reflux disease) - Primary    On protonix.  Follow.        Relevant Medications   pantoprazole (PROTONIX) 40 MG tablet   Head pain    No pain now.  Discussed further w/up.  She declines.  Wants to monitor.  Will notify me if persistent.        Hypercholesterolemia    Low cholesterol diet and exercise.  Follow lipid panel.        Relevant Orders   Lipid panel   Hepatic function panel   Hyperglycemia    Low carb diet and exercise.  Follow met b and a1c.        Relevant Orders   Hemoglobin G6Y   Basic metabolic panel   Obesity    Diet and exercise.  Follow.            Einar Pheasant,  MD

## 2015-02-24 NOTE — Progress Notes (Signed)
Pre-visit discussion using our clinic review tool. No additional management support is needed unless otherwise documented below in the visit note.  

## 2015-03-02 ENCOUNTER — Encounter: Payer: Self-pay | Admitting: Internal Medicine

## 2015-03-02 DIAGNOSIS — R51 Headache: Secondary | ICD-10-CM

## 2015-03-02 DIAGNOSIS — R519 Headache, unspecified: Secondary | ICD-10-CM | POA: Insufficient documentation

## 2015-03-02 NOTE — Assessment & Plan Note (Signed)
Followed by Dr Ma Hillock and Dr Tamala Julian.  Mammogram 08/30/14 - Birads I.  Saw Dr Ma Hillock - 12/2014.

## 2015-03-02 NOTE — Assessment & Plan Note (Signed)
Diet and exercise.  Follow.  

## 2015-03-02 NOTE — Assessment & Plan Note (Signed)
Doing well on current regimen.  Follow.   

## 2015-03-02 NOTE — Assessment & Plan Note (Signed)
Low cholesterol diet and exercise.  Follow lipid panel.   

## 2015-03-02 NOTE — Assessment & Plan Note (Signed)
Low carb diet and exercise.  Follow met b and a1c.   

## 2015-03-02 NOTE — Assessment & Plan Note (Signed)
On protonix.  Follow.  

## 2015-03-02 NOTE — Assessment & Plan Note (Signed)
No pain now.  Discussed further w/up.  She declines.  Wants to monitor.  Will notify me if persistent.

## 2015-03-03 ENCOUNTER — Ambulatory Visit: Payer: Medicare Other | Admitting: Internal Medicine

## 2015-03-12 ENCOUNTER — Other Ambulatory Visit (INDEPENDENT_AMBULATORY_CARE_PROVIDER_SITE_OTHER): Payer: Medicare Other

## 2015-03-12 ENCOUNTER — Other Ambulatory Visit: Payer: Self-pay

## 2015-03-12 ENCOUNTER — Other Ambulatory Visit: Payer: Self-pay | Admitting: *Deleted

## 2015-03-12 ENCOUNTER — Telehealth: Payer: Self-pay | Admitting: *Deleted

## 2015-03-12 DIAGNOSIS — E7800 Pure hypercholesterolemia, unspecified: Secondary | ICD-10-CM | POA: Diagnosis not present

## 2015-03-12 DIAGNOSIS — R739 Hyperglycemia, unspecified: Secondary | ICD-10-CM

## 2015-03-12 LAB — BASIC METABOLIC PANEL
BUN: 13 mg/dL (ref 6–23)
CHLORIDE: 103 meq/L (ref 96–112)
CO2: 31 meq/L (ref 19–32)
Calcium: 10.1 mg/dL (ref 8.4–10.5)
Creatinine, Ser: 0.79 mg/dL (ref 0.40–1.20)
GFR: 93.92 mL/min (ref >60.00)
GLUCOSE: 107 mg/dL — AB (ref 70–99)
POTASSIUM: 4.6 meq/L (ref 3.5–5.1)
SODIUM: 141 meq/L (ref 135–145)

## 2015-03-12 LAB — HEPATIC FUNCTION PANEL
ALK PHOS: 67 U/L (ref 39–117)
ALT: 16 U/L (ref 0–35)
AST: 20 U/L (ref 0–37)
Albumin: 4.1 g/dL (ref 3.5–5.2)
BILIRUBIN DIRECT: 0.1 mg/dL (ref 0.0–0.3)
BILIRUBIN TOTAL: 0.6 mg/dL (ref 0.2–1.2)
TOTAL PROTEIN: 7.1 g/dL (ref 6.0–8.3)

## 2015-03-12 LAB — LIPID PANEL
CHOL/HDL RATIO: 3
CHOLESTEROL: 191 mg/dL (ref 0–200)
HDL: 71.1 mg/dL (ref >39.00)
LDL CALC: 107 mg/dL — AB (ref 0–99)
NONHDL: 119.72
TRIGLYCERIDES: 64 mg/dL (ref 0.0–149.0)
VLDL: 12.8 mg/dL (ref 0.0–40.0)

## 2015-03-12 LAB — HEMOGLOBIN A1C: Hgb A1c MFr Bld: 6 % (ref 4.6–6.5)

## 2015-03-12 MED ORDER — FLUTICASONE PROPIONATE 50 MCG/ACT NA SUSP: 2.0000 | Freq: Every day | NASAL | Status: DC

## 2015-03-12 MED ORDER — FLUTICASONE PROPIONATE 50 MCG/ACT NA SUSP: NASAL | Status: DC

## 2015-03-12 NOTE — Telephone Encounter (Signed)
Patient has requested  Rx for Flonase. Patient also has a form for disability to be signed by Dr. Nicki Reaper. Form placed in box.

## 2015-03-12 NOTE — Telephone Encounter (Signed)
Flonase refill sent to pharmacy & form will be placed in Dr. Bary Leriche folder. Will call patient when ready.

## 2015-03-13 ENCOUNTER — Encounter: Payer: Self-pay | Admitting: *Deleted

## 2015-03-16 ENCOUNTER — Telehealth: Payer: Self-pay | Admitting: *Deleted

## 2015-03-16 NOTE — Telephone Encounter (Signed)
Her tax person said she could get some benefits if md would sign paper. She states it says disability at the top of it but she is not disabled.  I called her today and got her voicemail and left message that since she is not disabled the form could not be filled out.  There is a place that md has to check stating he is qualified to determine she is disabled and that has not been determined so we are unable to sign form. Told her she can rtn call if she has questions

## 2015-03-26 ENCOUNTER — Other Ambulatory Visit: Payer: Self-pay | Admitting: Internal Medicine

## 2015-03-26 DIAGNOSIS — G629 Polyneuropathy, unspecified: Secondary | ICD-10-CM

## 2015-03-26 MED ORDER — PREGABALIN 75 MG PO CAPS: 150.0000 mg | ORAL_CAPSULE | Freq: Two times a day (BID) | ORAL | Status: DC

## 2015-05-13 ENCOUNTER — Telehealth: Payer: Self-pay | Admitting: Internal Medicine

## 2015-05-13 NOTE — Telephone Encounter (Signed)
Called and spoke with pt.  Discussed her disability form.  Explained that I do not determine disability.  She states Dr Ma Hillock completed work up prior.  States her disability came from her cancer treatment, nerve damage, etc.  Plans to f/u at the cancer center.

## 2015-05-13 NOTE — Telephone Encounter (Signed)
I do not mind talking to pt, but I have not received any disability paper work.  If it was a request for records then this would have been sent out.  Do you have any paper work on pt.  I am not sure about the original message.  It just states she needs to talk about disability paperwork.  She may not have dropped off yet.  I do not do disability forms.  Disability is determined by specific MDs that assess for this.  Let me know if any problems.

## 2015-05-13 NOTE — Telephone Encounter (Signed)
I called pt & she only wants to speak with Dr. Nicki Reaper.

## 2015-05-13 NOTE — Telephone Encounter (Signed)
Pt called needing to speak to Dr Nicki Reaper regarding pt disability paperwork. Call pt @ [phone]. Cell [phone]. Thank You!

## 2015-05-13 NOTE — Telephone Encounter (Signed)
Please advise if this is completed 

## 2015-05-13 NOTE — Telephone Encounter (Signed)
Pt refused to give me more information & stated that Dr. Nicki Reaper has never had any problems speaking with her in the past.

## 2015-06-09 ENCOUNTER — Telehealth: Payer: Self-pay | Admitting: *Deleted

## 2015-06-09 NOTE — Telephone Encounter (Signed)
Pt states that she is switching pharmacies to [facility] [phone]  Fax # [phone] She does not need a refill of arimidex now She states the new pharmacy will send a request when she needs it refilled. I passed on the message to Renita Papa, rn for Dr. Jacinto Reap

## 2015-06-10 ENCOUNTER — Other Ambulatory Visit: Payer: Self-pay | Admitting: Internal Medicine

## 2015-06-10 ENCOUNTER — Other Ambulatory Visit: Payer: Self-pay

## 2015-06-10 MED ORDER — ROSUVASTATIN CALCIUM 10 MG PO TABS: 10.0000 mg | ORAL_TABLET | Freq: Every day | ORAL | Status: DC

## 2015-06-24 ENCOUNTER — Other Ambulatory Visit: Payer: Self-pay | Admitting: Internal Medicine

## 2015-06-24 ENCOUNTER — Other Ambulatory Visit: Payer: Self-pay | Admitting: *Deleted

## 2015-06-24 MED ORDER — ANASTROZOLE 1 MG PO TABS: 1.0000 mg | ORAL_TABLET | Freq: Every day | ORAL | Status: DC

## 2015-06-24 NOTE — Telephone Encounter (Signed)
Dr. Jacinto Reap, May I RF this RX. Also, the patient will not see you until August. Do you want to see the patient sooner?--Renita Papa

## 2015-06-24 NOTE — Telephone Encounter (Signed)
MD RF and sent RX to [facility] via [facility]

## 2015-06-24 NOTE — Telephone Encounter (Signed)
That's okay; will see her as planned in august. i okayed the refills- Thanks

## 2015-06-24 NOTE — Telephone Encounter (Signed)
[facility] patient last seen in August 2016, next appt in August 2017

## 2015-09-01 ENCOUNTER — Ambulatory Visit
Admission: RE | Admit: 2015-09-01 | Discharge: 2015-09-01 | Disposition: A | Discharge status: Home / Self Care | Payer: Medicare HMO | Source: Ambulatory Visit | Attending: Internal Medicine | Admitting: Internal Medicine

## 2015-09-01 ENCOUNTER — Other Ambulatory Visit: Payer: Self-pay | Admitting: Internal Medicine

## 2015-09-01 DIAGNOSIS — Z1231 Encounter for screening mammogram for malignant neoplasm of breast: Secondary | ICD-10-CM | POA: Insufficient documentation

## 2015-09-01 DIAGNOSIS — Z853 Personal history of malignant neoplasm of breast: Secondary | ICD-10-CM | POA: Insufficient documentation

## 2015-09-01 DIAGNOSIS — Z9012 Acquired absence of left breast and nipple: Secondary | ICD-10-CM | POA: Diagnosis not present

## 2015-09-01 DIAGNOSIS — C50912 Malignant neoplasm of unspecified site of left female breast: Secondary | ICD-10-CM

## 2015-09-04 ENCOUNTER — Encounter (INDEPENDENT_AMBULATORY_CARE_PROVIDER_SITE_OTHER): Payer: Self-pay

## 2015-09-04 ENCOUNTER — Encounter: Payer: Self-pay | Admitting: Internal Medicine

## 2015-09-04 ENCOUNTER — Ambulatory Visit (INDEPENDENT_AMBULATORY_CARE_PROVIDER_SITE_OTHER): Payer: Medicare HMO | Admitting: Internal Medicine

## 2015-09-04 VITALS — BP 110/60 | HR 99 | Temp 98.3°F | Resp 18 | Ht 64.25 in | Wt 217.5 lb

## 2015-09-04 DIAGNOSIS — Z91048 Other nonmedicinal substance allergy status: Secondary | ICD-10-CM

## 2015-09-04 DIAGNOSIS — E7800 Pure hypercholesterolemia, unspecified: Secondary | ICD-10-CM | POA: Diagnosis not present

## 2015-09-04 DIAGNOSIS — Z0000 Encounter for general adult medical examination without abnormal findings: Secondary | ICD-10-CM | POA: Diagnosis not present

## 2015-09-04 DIAGNOSIS — R739 Hyperglycemia, unspecified: Secondary | ICD-10-CM | POA: Diagnosis not present

## 2015-09-04 DIAGNOSIS — Z9109 Other allergy status, other than to drugs and biological substances: Secondary | ICD-10-CM

## 2015-09-04 DIAGNOSIS — K219 Gastro-esophageal reflux disease without esophagitis: Secondary | ICD-10-CM

## 2015-09-04 DIAGNOSIS — E669 Obesity, unspecified: Secondary | ICD-10-CM

## 2015-09-04 DIAGNOSIS — C50912 Malignant neoplasm of unspecified site of left female breast: Secondary | ICD-10-CM

## 2015-09-04 MED ORDER — OMEPRAZOLE 10 MG PO CPDR: 10.0000 mg | DELAYED_RELEASE_CAPSULE | Freq: Every day | ORAL | Status: DC

## 2015-09-04 MED ORDER — MONTELUKAST SODIUM 10 MG PO TABS: 10.0000 mg | ORAL_TABLET | Freq: Every day | ORAL | Status: DC

## 2015-09-04 MED ORDER — FLUTICASONE PROPIONATE 50 MCG/ACT NA SUSP: NASAL | Status: DC

## 2015-09-04 MED ORDER — ROSUVASTATIN CALCIUM 10 MG PO TABS: 10.0000 mg | ORAL_TABLET | Freq: Every day | ORAL | Status: DC

## 2015-09-04 NOTE — Progress Notes (Signed)
Pre-visit discussion using our clinic review tool. No additional management support is needed unless otherwise documented below in the visit note.  

## 2015-09-04 NOTE — Progress Notes (Signed)
Patient ID: [name], female   DOB: 1951-02-13, 65 y.o.   MRN: [id number]   Subjective:    Patient ID: [name], female    DOB: 12-06-1950, 65 y.o.   MRN: [id number]  HPI  Patient here for her physical exam.  She is exercising.  No chest pain or tightness with increased activity or exertion.  She does report some increased and persistent throat congestion.  Present for two months.  Taking mucinex.  Using saline and flonase.  Still with drainage.  No sob.  No reported acid reflux.  No abdominal pain or cramping.  Bowels stable.  Some hot flashes reported.    Past Medical History  Diagnosis Date  . Allergy   . Dysmenorrhea   . Hypercholesterolemia   . Diverticulosis   . Breast cancer ([place]) 2012    mastectomy with chemo and rad tx   Past Surgical History  Procedure Laterality Date  . Nasal sinus surgery  07/2001  . Cesarean section  1970  . Cesarean section  1973  . Cesarean section  1987  . Breast surgery  2011    breast cancer  . Breast biopsy Right 2012    benign  . Mastectomy Left 2012    with chemo and rad tx   Family History  Problem Relation Age of Onset  . Cirrhosis Brother     liver  . Breast cancer Paternal Aunt     2 aunts-60's   Social History   Social History  . Marital Status: Divorced    Spouse Name: N/A  . Number of Children: N/A  . Years of Education: N/A   Social History Main Topics  . Smoking status: Former Smoker    Types: Cigarettes  . Smokeless tobacco: Never Used  . Alcohol Use: No  . Drug Use: No  . Sexual Activity: Not Asked   Other Topics Concern  . None   Social History Narrative    Outpatient Encounter Prescriptions as of 09/04/2015  Medication Sig  . Albuterol Sulfate (PROAIR HFA IN) 90 mcg. 2 puffs using inhaler four times a day as needed.  Marland Kitchen anastrozole (ARIMIDEX) 1 MG tablet Take 1 tablet (1 mg total) by mouth daily.  . Ascorbic Acid (VITAMIN C PO) Take by mouth.  Marland Kitchen azelastine (ASTELIN) 137 MCG/SPRAY nasal  spray 137 mcg. Spray 2 sprays into both nostrils twice a day  . Azelastine HCl (ASTEPRO) 0.15 % SOLN Spray 2 sprays into both nostrils once a day as needed (For allergies)  . b complex vitamins capsule Take 1 capsule by mouth daily.  . budesonide-formoterol (SYMBICORT) 160-4.5 MCG/ACT inhaler Inhale 2 puffs into the lungs 2 (two) times daily.  . Calcium Carb-Cholecalciferol (CALCIUM 600/VITAMIN D3) 600-800 MG-UNIT TABS Take by mouth daily.  . calcium carbonate (TUMS - DOSED IN MG ELEMENTAL CALCIUM) 500 MG chewable tablet Chew 1 tablet by mouth daily.  . Calcium Carbonate-Simethicone (TUMS PLUS PO) Take by mouth.  . cetirizine (ZYRTEC) 10 MG tablet Take 10 mg by mouth daily.  . cholecalciferol (VITAMIN D) 400 UNITS TABS Take 400 Units by mouth daily. Chewable  . Cyanocobalamin (VITAMIN B12 PO) Take 500 mcg by mouth daily. Administer 1 tablet by mouth once a day  . fluticasone (FLONASE) 50 MCG/ACT nasal spray Use 1 spray into each nostril twice a day  . levalbuterol (XOPENEX HFA) 45 MCG/ACT inhaler Inhale 2 puffs four times a day as needed  . loratadine (CLARITIN) 10 MG tablet Take 1 tablet by mouth  once a day as needed  . montelukast (SINGULAIR) 10 MG tablet Take 1 tablet (10 mg total) by mouth at bedtime. 1 tablet once a day  . omeprazole (PRILOSEC) 10 MG capsule Take 1 capsule (10 mg total) by mouth daily.  . pregabalin (LYRICA) 75 MG capsule Take 2 capsules (150 mg total) by mouth 2 (two) times daily.  . rosuvastatin (CRESTOR) 10 MG tablet Take 1 tablet (10 mg total) by mouth daily.  . sodium chloride (OCEAN) 0.65 % nasal spray Place 1 spray into the nose as needed for congestion.  . vitamin E 400 UNIT capsule Take 400 Units by mouth daily.  . [DISCONTINUED] fluticasone (FLONASE) 50 MCG/ACT nasal spray Use 1 spray into each nostril twice a day  . [DISCONTINUED] montelukast (SINGULAIR) 10 MG tablet Take 1 tablet (10 mg total) by mouth at bedtime. 1 tablet once a day  . [DISCONTINUED] omeprazole  (PRILOSEC) 10 MG capsule Take 10 mg by mouth daily.  . [DISCONTINUED] rosuvastatin (CRESTOR) 10 MG tablet Take 1 tablet (10 mg total) by mouth daily.  . [DISCONTINUED] pantoprazole (PROTONIX) 40 MG tablet Take 1 tablet (40 mg total) by mouth daily.   No facility-administered encounter medications on file as of 09/04/2015.    Review of Systems  Constitutional: Negative for appetite change and unexpected weight change.  HENT: Positive for congestion and postnasal drip. Negative for sinus pressure.   Eyes: Negative for pain and visual disturbance.  Respiratory: Negative for cough, chest tightness and shortness of breath.   Cardiovascular: Negative for chest pain, palpitations and leg swelling.  Gastrointestinal: Negative for nausea, vomiting, abdominal pain and diarrhea.  Genitourinary: Negative for dysuria and difficulty urinating.  Musculoskeletal: Negative for back pain and joint swelling.  Skin: Negative for color change and rash.  Neurological: Negative for dizziness, light-headedness and headaches.  Hematological: Negative for adenopathy. Does not bruise/bleed easily.  Psychiatric/Behavioral: Negative for dysphoric mood and agitation.       Objective:    Physical Exam  Constitutional: She appears well-developed and well-nourished. No distress.  HENT:  Nose: Nose normal.  Mouth/Throat: Oropharynx is clear and moist.  Neck: Neck supple. No thyromegaly present.  Cardiovascular: Normal rate and regular rhythm.   Pulmonary/Chest: Breath sounds normal. No respiratory distress. She has no wheezes.  S/p left mastectomy.  Right breast - no nodules appreciated.  No axillary adenopathy.   Abdominal: Soft. Bowel sounds are normal. There is no tenderness.  Musculoskeletal: She exhibits no edema or tenderness.  Lymphadenopathy:    She has no cervical adenopathy.  Skin: No rash noted. No erythema.  Psychiatric: She has a normal mood and affect. Her behavior is normal.    BP 110/60 mmHg   Pulse 99  Temp(Src) 98.3 F (36.8 C) (Oral)  Resp 18  Ht 5' 4.25" (1.632 m)  Wt 217 lb 8 oz (98.657 kg)  BMI 37.04 kg/m2  SpO2 97% Wt Readings from Last 3 Encounters:  09/04/15 217 lb 8 oz (98.657 kg)  02/24/15 214 lb 8 oz (97.297 kg)  12/27/14 214 lb 11.7 oz (97.401 kg)     Lab Results  Component Value Date   WBC 6.1 12/27/2014   HGB 12.7 12/27/2014   HCT 37.2 12/27/2014   PLT 172 12/27/2014   GLUCOSE 108* 09/11/2015   CHOL 188 09/11/2015   TRIG 71.0 09/11/2015   HDL 66.10 09/11/2015   LDLDIRECT 126.2 05/09/2013   LDLCALC 108* 09/11/2015   ALT 20 09/11/2015   AST 22 09/11/2015   NA  142 09/11/2015   K 4.6 09/11/2015   CL 104 09/11/2015   CREATININE 0.82 09/11/2015   BUN 15 09/11/2015   CO2 29 09/11/2015   TSH 1.88 09/11/2015   HGBA1C 6.2 09/11/2015   MICROALBUR <0.7 09/09/2014    Mm Screening Breast Tomo Uni R  09/01/2015  CLINICAL DATA:  Screening. EXAM: 2D DIGITAL SCREENING UNILATERAL RIGHT MAMMOGRAM WITH CAD AND ADJUNCT TOMO COMPARISON:  Previous exam(s). ACR Breast Density Category b: There are scattered areas of fibroglandular density. FINDINGS: The patient has had a left mastectomy. There are no findings suspicious for malignancy. Images were processed with CAD. IMPRESSION: No mammographic evidence of malignancy. A result letter of this screening mammogram will be mailed directly to the patient. RECOMMENDATION: Screening mammogram in one year.  (Code:SM-R-19M) BI-RADS CATEGORY  1: Negative. Electronically Signed   By: Ammie Ferrier M.D.   On: 09/01/2015 12:08       Assessment & Plan:   Problem List Items Addressed This Visit    Breast cancer [facility])    Has been followed by Dr Tamala Julian and Dr Ma Hillock.  Mammogram 09/01/15 - Birads I.       Environmental allergies    Persistent throat congestion and drainage despite her current medication regimen.  Refer to ENT for further evaluation.       Relevant Orders   Ambulatory referral to ENT   GERD (gastroesophageal  reflux disease)    No upper symptoms reported.        Relevant Medications   omeprazole (PRILOSEC) 10 MG capsule   Health care maintenance    Physical today 09/04/15.  Mammogram 09/01/15 - Birads I.  Colonoscopy 09/17/05.        Hypercholesterolemia    Low cholesterol diet and exercise.  On crestor.  Follow lipid panel and liver function tests.       Relevant Medications   rosuvastatin (CRESTOR) 10 MG tablet   Hyperglycemia    Low carb diet and exercise.  Follow met b and a1c.       Obesity    Discussed diet and exercise.        Other Visit Diagnoses    Routine general medical examination at a health care facility    -  Primary        Einar Pheasant, MD

## 2015-09-11 ENCOUNTER — Telehealth: Payer: Self-pay | Admitting: *Deleted

## 2015-09-11 ENCOUNTER — Other Ambulatory Visit (INDEPENDENT_AMBULATORY_CARE_PROVIDER_SITE_OTHER): Payer: Medicare HMO

## 2015-09-11 ENCOUNTER — Encounter: Payer: Self-pay | Admitting: *Deleted

## 2015-09-11 DIAGNOSIS — R739 Hyperglycemia, unspecified: Secondary | ICD-10-CM

## 2015-09-11 DIAGNOSIS — E7800 Pure hypercholesterolemia, unspecified: Secondary | ICD-10-CM

## 2015-09-11 LAB — TSH: TSH: 1.88 u[IU]/mL (ref 0.35–4.50)

## 2015-09-11 LAB — HEPATIC FUNCTION PANEL
ALBUMIN: 4.2 g/dL (ref 3.5–5.2)
ALT: 20 U/L (ref 0–35)
AST: 22 U/L (ref 0–37)
Alkaline Phosphatase: 65 U/L (ref 39–117)
BILIRUBIN DIRECT: 0.1 mg/dL (ref 0.0–0.3)
TOTAL PROTEIN: 6.7 g/dL (ref 6.0–8.3)
Total Bilirubin: 0.5 mg/dL (ref 0.2–1.2)

## 2015-09-11 LAB — LIPID PANEL
CHOL/HDL RATIO: 3
Cholesterol: 188 mg/dL (ref 0–200)
HDL: 66.1 mg/dL (ref >39.00)
LDL Cholesterol: 108 mg/dL — ABNORMAL HIGH (ref 0–99)
NonHDL: 122.27
Triglycerides: 71 mg/dL (ref 0.0–149.0)
VLDL: 14.2 mg/dL (ref 0.0–40.0)

## 2015-09-11 LAB — BASIC METABOLIC PANEL
BUN: 15 mg/dL (ref 6–23)
CALCIUM: 9.8 mg/dL (ref 8.4–10.5)
CO2: 29 mEq/L (ref 19–32)
Chloride: 104 mEq/L (ref 96–112)
Creatinine, Ser: 0.82 mg/dL (ref 0.40–1.20)
GFR: 89.83 mL/min (ref >60.00)
GLUCOSE: 108 mg/dL — AB (ref 70–99)
POTASSIUM: 4.6 meq/L (ref 3.5–5.1)
SODIUM: 142 meq/L (ref 135–145)

## 2015-09-11 LAB — HEMOGLOBIN A1C: Hgb A1c MFr Bld: 6.2 % (ref 4.6–6.5)

## 2015-09-11 NOTE — Telephone Encounter (Signed)
Orders placed for labs

## 2015-09-11 NOTE — Telephone Encounter (Signed)
Labs and dx?  

## 2015-09-15 ENCOUNTER — Encounter: Payer: Self-pay | Admitting: Internal Medicine

## 2015-09-15 NOTE — Assessment & Plan Note (Signed)
Low cholesterol diet and exercise.  On crestor.  Follow lipid panel and liver function tests.  

## 2015-09-15 NOTE — Assessment & Plan Note (Signed)
Discussed diet and exercise 

## 2015-09-15 NOTE — Assessment & Plan Note (Signed)
Persistent throat congestion and drainage despite her current medication regimen.  Refer to ENT for further evaluation.

## 2015-09-15 NOTE — Assessment & Plan Note (Signed)
No upper symptoms reported.   

## 2015-09-15 NOTE — Assessment & Plan Note (Signed)
Low carb diet and exercise.  Follow met b and a1c.

## 2015-09-15 NOTE — Assessment & Plan Note (Signed)
Physical today 09/04/15.  Mammogram 09/01/15 - Birads I.  Colonoscopy 09/17/05.

## 2015-09-15 NOTE — Assessment & Plan Note (Signed)
Has been followed by Dr Tamala Julian and Dr Ma Hillock.  Mammogram 09/01/15 - Birads I.

## 2015-09-17 ENCOUNTER — Telehealth: Payer: Self-pay | Admitting: *Deleted

## 2015-09-17 NOTE — Telephone Encounter (Signed)
FYI Patient uses  [facility] [phone]

## 2015-09-18 NOTE — Telephone Encounter (Signed)
Updated pharmacy list.

## 2015-09-25 ENCOUNTER — Other Ambulatory Visit: Payer: Self-pay | Admitting: *Deleted

## 2015-09-25 MED ORDER — ROSUVASTATIN CALCIUM 10 MG PO TABS: 10.0000 mg | ORAL_TABLET | Freq: Every day | ORAL | Status: DC

## 2015-09-25 MED ORDER — OMEPRAZOLE 10 MG PO CPDR: 10.0000 mg | DELAYED_RELEASE_CAPSULE | Freq: Every day | ORAL | Status: DC

## 2015-09-25 MED ORDER — MONTELUKAST SODIUM 10 MG PO TABS: 10.0000 mg | ORAL_TABLET | Freq: Every day | ORAL | Status: DC

## 2015-10-01 ENCOUNTER — Other Ambulatory Visit: Payer: Self-pay | Admitting: *Deleted

## 2015-10-01 MED ORDER — ANASTROZOLE 1 MG PO TABS: 1.0000 mg | ORAL_TABLET | Freq: Every day | ORAL | Status: DC

## 2015-10-02 ENCOUNTER — Encounter: Payer: Self-pay | Admitting: *Deleted

## 2015-10-03 ENCOUNTER — Ambulatory Visit: Payer: Medicare HMO | Admitting: Anesthesiology

## 2015-10-03 ENCOUNTER — Encounter: Payer: Self-pay | Admitting: *Deleted

## 2015-10-03 ENCOUNTER — Encounter
Admission: RE | Disposition: A | Discharge status: Home / Self Care | Payer: Self-pay | Source: Ambulatory Visit | Attending: Unknown Physician Specialty

## 2015-10-03 ENCOUNTER — Ambulatory Visit
Admission: RE | Admit: 2015-10-03 | Discharge: 2015-10-03 | Disposition: A | Discharge status: Home / Self Care | Payer: Medicare HMO | Source: Ambulatory Visit | Attending: Unknown Physician Specialty | Admitting: Unknown Physician Specialty

## 2015-10-03 DIAGNOSIS — K219 Gastro-esophageal reflux disease without esophagitis: Secondary | ICD-10-CM | POA: Diagnosis not present

## 2015-10-03 DIAGNOSIS — Z8742 Personal history of other diseases of the female genital tract: Secondary | ICD-10-CM | POA: Diagnosis not present

## 2015-10-03 DIAGNOSIS — Z79899 Other long term (current) drug therapy: Secondary | ICD-10-CM | POA: Diagnosis not present

## 2015-10-03 DIAGNOSIS — Z9012 Acquired absence of left breast and nipple: Secondary | ICD-10-CM | POA: Insufficient documentation

## 2015-10-03 DIAGNOSIS — Z853 Personal history of malignant neoplasm of breast: Secondary | ICD-10-CM | POA: Diagnosis not present

## 2015-10-03 DIAGNOSIS — Z1211 Encounter for screening for malignant neoplasm of colon: Secondary | ICD-10-CM | POA: Diagnosis present

## 2015-10-03 DIAGNOSIS — Z7951 Long term (current) use of inhaled steroids: Secondary | ICD-10-CM | POA: Insufficient documentation

## 2015-10-03 DIAGNOSIS — Z87891 Personal history of nicotine dependence: Secondary | ICD-10-CM | POA: Insufficient documentation

## 2015-10-03 DIAGNOSIS — J45909 Unspecified asthma, uncomplicated: Secondary | ICD-10-CM | POA: Diagnosis not present

## 2015-10-03 DIAGNOSIS — K640 First degree hemorrhoids: Secondary | ICD-10-CM | POA: Insufficient documentation

## 2015-10-03 DIAGNOSIS — K5730 Diverticulosis of large intestine without perforation or abscess without bleeding: Secondary | ICD-10-CM | POA: Insufficient documentation

## 2015-10-03 DIAGNOSIS — K635 Polyp of colon: Secondary | ICD-10-CM | POA: Insufficient documentation

## 2015-10-03 DIAGNOSIS — Z9889 Other specified postprocedural states: Secondary | ICD-10-CM | POA: Insufficient documentation

## 2015-10-03 DIAGNOSIS — E7800 Pure hypercholesterolemia, unspecified: Secondary | ICD-10-CM | POA: Insufficient documentation

## 2015-10-03 HISTORY — DX: Unspecified asthma, uncomplicated: J45.909

## 2015-10-03 HISTORY — DX: Allergic rhinitis, unspecified: J30.9

## 2015-10-03 HISTORY — PX: COLONOSCOPY WITH PROPOFOL: SHX5780

## 2015-10-03 LAB — HM COLONOSCOPY

## 2015-10-03 SURGERY — COLONOSCOPY WITH PROPOFOL
Anesthesia: General

## 2015-10-03 MED ORDER — MIDAZOLAM HCL 5 MG/5ML IJ SOLN
INTRAMUSCULAR | Status: DC | PRN
  Administered 2015-10-03: 1 mg via INTRAVENOUS

## 2015-10-03 MED ORDER — PROPOFOL 10 MG/ML IV BOLUS
INTRAVENOUS | Status: DC | PRN
  Administered 2015-10-03: 30 mg via INTRAVENOUS
  Administered 2015-10-03: 70 mg via INTRAVENOUS

## 2015-10-03 MED ORDER — PROPOFOL 500 MG/50ML IV EMUL
INTRAVENOUS | Status: DC | PRN
  Administered 2015-10-03: 120 ug/kg/min via INTRAVENOUS

## 2015-10-03 MED ORDER — SODIUM CHLORIDE 0.9 % IV SOLN
INTRAVENOUS | Status: DC
Start: 2015-10-03 — End: 2015-10-03
  Administered 2015-10-03: 13:00:00 via INTRAVENOUS

## 2015-10-03 MED ORDER — SODIUM CHLORIDE 0.9 % IV SOLN
INTRAVENOUS | Status: DC
Start: 2015-10-03 — End: 2015-10-03

## 2015-10-03 MED ORDER — LIDOCAINE 2% (20 MG/ML) 5 ML SYRINGE
INTRAMUSCULAR | Status: DC | PRN
  Administered 2015-10-03: 50 mg via INTRAVENOUS

## 2015-10-03 NOTE — Transfer of Care (Signed)
Immediate Anesthesia Transfer of Care Note  Patient: Robin Hill  Procedure(s) Performed: Procedure(s): COLONOSCOPY WITH PROPOFOL (N/A)  Patient Location: Endoscopy Unit  Anesthesia Type:General  Level of Consciousness: awake  Airway & Oxygen Therapy: Patient Spontanous Breathing and Patient connected to nasal cannula oxygen  Post-op Assessment: Report given to RN and Post -op Vital signs reviewed and stable  Post vital signs: Reviewed  Last Vitals:  Filed Vitals:   10/03/15 1239 10/03/15 1335  BP: 127/79 116/68  Pulse: 78 74  Temp: 36.2 C 36.1 C  Resp: 18 16    Last Pain: There were no vitals filed for this visit.       Complications: No apparent anesthesia complications

## 2015-10-03 NOTE — Op Note (Signed)
[facility]enter[facility] Patient Name: Robin Hill Procedure Date: 10/03/2015 1:02 PM MRN: [id number] Account #: [id number] Date of Birth: [birth year] Admit Type: Outpatient Age: 65 Room: [facility] ENDO ROOM 4 Gender: Female Note Status: Finalized Procedure:            Colonoscopy Indications:          Screening for colorectal malignant neoplasm Providers:            Manya Silvas, MD Referring MD:         Einar Pheasant, MD (Referring MD) Medicines:            Propofol per Anesthesia Complications:        No immediate complications. Procedure:            Pre-Anesthesia Assessment:                       - After reviewing the risks and benefits, the patient                        was deemed in satisfactory condition to undergo the                        procedure.                       After obtaining informed consent, the colonoscope was                        passed under direct vision. Throughout the procedure,                        the patient's blood pressure, pulse, and oxygen                        saturations were monitored continuously. The                        Colonoscope was introduced through the anus and                        advanced to the the cecum, identified by appendiceal                        orifice and ileocecal valve. The colonoscopy was                        performed without difficulty. The patient tolerated the                        procedure well. The quality of the bowel preparation                        was excellent. Findings:      Two sessile polyps were found in the recto-sigmoid colon. The polyps       were diminutive in size. These polyps were removed with a jumbo cold       forceps. Resection and retrieval were complete.      Multiple medium-mouthed diverticula were found in the transverse colon,       proximal transverse colon and ascending colon.      A diffuse area of granular  mucosa was found in the sigmoid  colon.       Biopsies were taken with a cold forceps for histology.      Internal hemorrhoids were found during endoscopy. The hemorrhoids were       medium-sized and Grade I (internal hemorrhoids that do not prolapse). Impression:           - Two diminutive polyps at the recto-sigmoid colon,                        removed with a jumbo cold forceps. Resected and                        retrieved.                       - Diverticulosis in the transverse colon, in the                        proximal transverse colon and in the ascending colon.                       - Granularity in the sigmoid colon. Biopsied.                       - Internal hemorrhoids. Recommendation:       - Await pathology results. Manya Silvas, MD 10/03/2015 1:37:28 PM This report has been signed electronically. Number of Addenda: 0 Note Initiated On: 10/03/2015 1:02 PM Scope Withdrawal Time: 0 hours 10 minutes 42 seconds  Total Procedure Duration: 0 hours 19 minutes 52 seconds       [facility]

## 2015-10-03 NOTE — Anesthesia Preprocedure Evaluation (Addendum)
Anesthesia Evaluation  Patient identified by MRN, date of birth, ID band Patient awake    Reviewed: Allergy & Precautions, NPO status , Patient's Chart, lab work & pertinent test results, reviewed documented beta blocker date and time   Airway Mallampati: II  TM Distance: >3 FB     Dental  (+) Chipped   Pulmonary asthma , former smoker,           Cardiovascular      Neuro/Psych  Headaches,    GI/Hepatic GERD  ,  Endo/Other    Renal/GU      Musculoskeletal   Abdominal   Peds  Hematology   Anesthesia Other Findings   Reproductive/Obstetrics                            Anesthesia Physical Anesthesia Plan  ASA: III  Anesthesia Plan: General   Post-op Pain Management:    Induction: Intravenous  Airway Management Planned: Nasal Cannula  Additional Equipment:   Intra-op Plan:   Post-operative Plan:   Informed Consent: I have reviewed the patients History and Physical, chart, labs and discussed the procedure including the risks, benefits and alternatives for the proposed anesthesia with the patient or authorized representative who has indicated his/her understanding and acceptance.     Plan Discussed with: CRNA  Anesthesia Plan Comments:         Anesthesia Quick Evaluation

## 2015-10-03 NOTE — H&P (Signed)
Primary Care Physician:  Einar Pheasant, MD Primary Gastroenterologist:  Dr. Vira Agar  Pre-Procedure History & Physical: HPI:  Robin Hill is a 65 y.o. female is here for an colonoscopy.   Past Medical History  Diagnosis Date  . Allergy   . Dysmenorrhea   . Hypercholesterolemia   . Diverticulosis   . Breast cancer ([place]) 2012    mastectomy with chemo and rad tx  . Allergic rhinitis   . Asthma     Past Surgical History  Procedure Laterality Date  . Nasal sinus surgery  07/2001  . Cesarean section  1970  . Cesarean section  1973  . Cesarean section  1987  . Breast surgery  2011    breast cancer  . Breast biopsy Right 2012    benign  . Mastectomy Left 2012    with chemo and rad tx    Prior to Admission medications   Medication Sig Start Date End Date Taking? Authorizing Provider  Albuterol Sulfate (PROAIR HFA IN) 90 mcg. 2 puffs using inhaler four times a day as needed.    Historical Provider, MD  anastrozole (ARIMIDEX) 1 MG tablet Take 1 tablet (1 mg total) by mouth daily. 10/01/15   Cammie Sickle, MD  Ascorbic Acid (VITAMIN C PO) Take by mouth.    Historical Provider, MD  azelastine (ASTELIN) 137 MCG/SPRAY nasal spray 137 mcg. Spray 2 sprays into both nostrils twice a day    Historical Provider, MD  Azelastine HCl (ASTEPRO) 0.15 % SOLN Spray 2 sprays into both nostrils once a day as needed (For allergies)    Historical Provider, MD  b complex vitamins capsule Take 1 capsule by mouth daily.    Historical Provider, MD  budesonide-formoterol (SYMBICORT) 160-4.5 MCG/ACT inhaler Inhale 2 puffs into the lungs 2 (two) times daily. 08/10/14   Einar Pheasant, MD  Calcium Carb-Cholecalciferol (CALCIUM 600/VITAMIN D3) 600-800 MG-UNIT TABS Take by mouth daily.    Historical Provider, MD  calcium carbonate (TUMS - DOSED IN MG ELEMENTAL CALCIUM) 500 MG chewable tablet Chew 1 tablet by mouth daily.    Historical Provider, MD  Calcium Carbonate-Simethicone (TUMS PLUS PO) Take by  mouth.    Historical Provider, MD  cetirizine (ZYRTEC) 10 MG tablet Take 10 mg by mouth daily.    Historical Provider, MD  cholecalciferol (VITAMIN D) 400 UNITS TABS Take 400 Units by mouth daily. Chewable    Historical Provider, MD  Cyanocobalamin (VITAMIN B12 PO) Take 500 mcg by mouth daily. Administer 1 tablet by mouth once a day    Historical Provider, MD  fluticasone (FLONASE) 50 MCG/ACT nasal spray Use 1 spray into each nostril twice a day 09/04/15   Einar Pheasant, MD  levalbuterol [facility] HFA) 45 MCG/ACT inhaler Inhale 2 puffs four times a day as needed    Historical Provider, MD  loratadine (CLARITIN) 10 MG tablet Take 1 tablet by mouth once a day as needed    Historical Provider, MD  montelukast (SINGULAIR) 10 MG tablet Take 1 tablet (10 mg total) by mouth at bedtime. 1 tablet once a day 09/25/15   Einar Pheasant, MD  omeprazole (PRILOSEC) 10 MG capsule Take 1 capsule (10 mg total) by mouth daily. 09/25/15   Einar Pheasant, MD  pregabalin (LYRICA) 75 MG capsule Take 2 capsules (150 mg total) by mouth 2 (two) times daily. 03/26/15   Cammie Sickle, MD  rosuvastatin (CRESTOR) 10 MG tablet Take 1 tablet (10 mg total) by mouth daily. 09/25/15   Charlene  Nicki Reaper, MD  sodium chloride (OCEAN) 0.65 % nasal spray Place 1 spray into the nose as needed for congestion.    Historical Provider, MD  vitamin E 400 UNIT capsule Take 400 Units by mouth daily.    Historical Provider, MD    Allergies as of 09/22/2015  . (No Known Allergies)    Family History  Problem Relation Age of Onset  . Cirrhosis Brother     liver  . Breast cancer Paternal Aunt     2 aunts-60's    Social History   Social History  . Marital Status: Divorced    Spouse Name: N/A  . Number of Children: N/A  . Years of Education: N/A   Occupational History  . Not on file.   Social History Main Topics  . Smoking status: Former Smoker    Types: Cigarettes  . Smokeless tobacco: Never Used  . Alcohol Use: No  . Drug Use:  No  . Sexual Activity: Not on file   Other Topics Concern  . Not on file   Social History Narrative    Review of Systems: See HPI, otherwise negative ROS  Physical Exam: BP 127/79 mmHg  Pulse 78  Temp(Src) 97.1 F (36.2 C) (Tympanic)  Resp 18  Ht 5\' 4"  (1.626 m)  Wt 94.802 kg (209 lb)  BMI 35.86 kg/m2  SpO2 98% General:   Alert,  pleasant and cooperative in NAD Head:  Normocephalic and atraumatic. Neck:  Supple; no masses or thyromegaly. Lungs:  Clear throughout to auscultation.    Heart:  Regular rate and rhythm. Abdomen:  Soft, nontender and nondistended. Normal bowel sounds, without guarding, and without rebound.   Neurologic:  Alert and  oriented x4;  grossly normal neurologically.  Impression/Plan: Robin Hill is here for an colonoscopy to be performed for screening  Risks, benefits, limitations, and alternatives regarding  colonoscopy have been reviewed with the patient.  Questions have been answered.  All parties agreeable.   Gaylyn Cheers, MD  10/03/2015, 1:05 PM

## 2015-10-03 NOTE — Anesthesia Postprocedure Evaluation (Signed)
Anesthesia Post Note  Patient: Robin Hill  Procedure(s) Performed: Procedure(s) (LRB): COLONOSCOPY WITH PROPOFOL (N/A)  Patient location during evaluation: Endoscopy Anesthesia Type: General Level of consciousness: awake and alert Pain management: pain level controlled Vital Signs Assessment: post-procedure vital signs reviewed and stable Respiratory status: spontaneous breathing, nonlabored ventilation, respiratory function stable and patient connected to nasal cannula oxygen Cardiovascular status: blood pressure returned to baseline and stable Postop Assessment: no signs of nausea or vomiting Anesthetic complications: no    Last Vitals:  Filed Vitals:   10/03/15 1355 10/03/15 1405  BP: 138/90 144/92  Pulse: 85 74  Temp:    Resp: 15 13    Last Pain: There were no vitals filed for this visit.               Amaiyah Nordhoff S

## 2015-10-06 ENCOUNTER — Encounter: Payer: Self-pay | Admitting: Unknown Physician Specialty

## 2015-10-07 LAB — SURGICAL PATHOLOGY

## 2015-10-08 ENCOUNTER — Encounter: Payer: Self-pay | Admitting: Internal Medicine

## 2015-10-08 DIAGNOSIS — Z86010 Personal history of colonic polyps: Secondary | ICD-10-CM | POA: Insufficient documentation

## 2015-10-26 ENCOUNTER — Encounter: Payer: Self-pay | Admitting: Internal Medicine

## 2015-12-25 ENCOUNTER — Other Ambulatory Visit: Payer: Self-pay

## 2015-12-25 DIAGNOSIS — C50912 Malignant neoplasm of unspecified site of left female breast: Secondary | ICD-10-CM

## 2015-12-26 ENCOUNTER — Encounter: Payer: Self-pay | Admitting: Internal Medicine

## 2015-12-26 ENCOUNTER — Inpatient Hospital Stay: Payer: Medicare HMO | Attending: Internal Medicine

## 2015-12-26 ENCOUNTER — Inpatient Hospital Stay (HOSPITAL_BASED_OUTPATIENT_CLINIC_OR_DEPARTMENT_OTHER): Payer: Medicare HMO | Admitting: Internal Medicine

## 2015-12-26 DIAGNOSIS — C50812 Malignant neoplasm of overlapping sites of left female breast: Secondary | ICD-10-CM | POA: Insufficient documentation

## 2015-12-26 DIAGNOSIS — Z9012 Acquired absence of left breast and nipple: Secondary | ICD-10-CM | POA: Diagnosis not present

## 2015-12-26 DIAGNOSIS — G629 Polyneuropathy, unspecified: Secondary | ICD-10-CM

## 2015-12-26 DIAGNOSIS — E7800 Pure hypercholesterolemia, unspecified: Secondary | ICD-10-CM

## 2015-12-26 DIAGNOSIS — Z853 Personal history of malignant neoplasm of breast: Secondary | ICD-10-CM | POA: Diagnosis not present

## 2015-12-26 DIAGNOSIS — Z9221 Personal history of antineoplastic chemotherapy: Secondary | ICD-10-CM | POA: Insufficient documentation

## 2015-12-26 DIAGNOSIS — Z923 Personal history of irradiation: Secondary | ICD-10-CM | POA: Diagnosis not present

## 2015-12-26 DIAGNOSIS — Z170 Estrogen receptor positive status [ER+]: Secondary | ICD-10-CM | POA: Diagnosis not present

## 2015-12-26 DIAGNOSIS — Z79811 Long term (current) use of aromatase inhibitors: Secondary | ICD-10-CM

## 2015-12-26 DIAGNOSIS — Z87891 Personal history of nicotine dependence: Secondary | ICD-10-CM | POA: Insufficient documentation

## 2015-12-26 DIAGNOSIS — C50912 Malignant neoplasm of unspecified site of left female breast: Secondary | ICD-10-CM

## 2015-12-26 DIAGNOSIS — Z79899 Other long term (current) drug therapy: Secondary | ICD-10-CM | POA: Insufficient documentation

## 2015-12-26 DIAGNOSIS — Z803 Family history of malignant neoplasm of breast: Secondary | ICD-10-CM | POA: Diagnosis not present

## 2015-12-26 LAB — CBC WITH DIFFERENTIAL/PLATELET
Basophils Absolute: 0 10*3/uL (ref 0–0.1)
Basophils Relative: 1 %
Eosinophils Absolute: 0.5 10*3/uL (ref 0–0.7)
Eosinophils Relative: 8 %
HEMATOCRIT: 35.5 % (ref 35.0–47.0)
HEMOGLOBIN: 12.8 g/dL (ref 12.0–16.0)
LYMPHS ABS: 1.7 10*3/uL (ref 1.0–3.6)
LYMPHS PCT: 27 %
MCH: 32.2 pg (ref 26.0–34.0)
MCHC: 36.1 g/dL — ABNORMAL HIGH (ref 32.0–36.0)
MCV: 89.1 fL (ref 80.0–100.0)
Monocytes Absolute: 0.5 10*3/uL (ref 0.2–0.9)
Monocytes Relative: 8 %
NEUTROS ABS: 3.6 10*3/uL (ref 1.4–6.5)
NEUTROS PCT: 56 %
Platelets: 167 10*3/uL (ref 150–440)
RBC: 3.98 MIL/uL (ref 3.80–5.20)
RDW: 13.1 % (ref 11.5–14.5)
WBC: 6.4 10*3/uL (ref 3.6–11.0)

## 2015-12-26 LAB — COMPREHENSIVE METABOLIC PANEL
ALK PHOS: 67 U/L (ref 38–126)
ALT: 23 U/L (ref 14–54)
AST: 31 U/L (ref 15–41)
Albumin: 4.2 g/dL (ref 3.5–5.0)
Anion gap: 7 (ref 5–15)
BUN: 12 mg/dL (ref 6–20)
CALCIUM: 9.6 mg/dL (ref 8.9–10.3)
CO2: 28 mmol/L (ref 22–32)
CREATININE: 0.68 mg/dL (ref 0.44–1.00)
Chloride: 105 mmol/L (ref 101–111)
Glucose, Bld: 105 mg/dL — ABNORMAL HIGH (ref 65–99)
Potassium: 3.9 mmol/L (ref 3.5–5.1)
Sodium: 140 mmol/L (ref 135–145)
Total Bilirubin: 0.4 mg/dL (ref 0.3–1.2)
Total Protein: 7.3 g/dL (ref 6.5–8.1)

## 2015-12-26 NOTE — Assessment & Plan Note (Addendum)
Left breast Ca- stage I ER/PR positive HER-2/neu negative status post mastectomy currently on adjuvant aromatase inhibitor. Clinically no evidence of recurrence. Mammogram May 2017 normal.  # Tolerating aromatase inhibitor well. Discussed 5 versus 10 years of AI data. Check breast cancer index. For now continue AI.   # Chronic peripheral neuropathy stable.  # Follow-up with me in 6 months with labs. Labs today revealed normal.

## 2015-12-26 NOTE — Progress Notes (Signed)
Mammogram around 2 months ago.  Pt reports left breast area feels and stretches different.  Numbness in feet is worse and sometimes hurts.

## 2015-12-26 NOTE — Progress Notes (Signed)
[place] OFFICE PROGRESS NOTE  Patient Care Team: Einar Pheasant, MD as PCP - General (Internal Medicine)  No matching staging information was found for the patient.   Oncology History   # 2011- LEFT BREAST [Dr.Smith; Dr.Pandit] Stage I IDC with lobular features pT1c (1.9cm) pSNmi-s/p Lumpec & RT; AC- Taxol x10 [sec to PN]; ER/PR-Pos; Her 2 NEU. 2012- DCIS s/p mastectomy- on AI' check [facility] [Aug 2017]  # PN- G2- on Lyrica     Cancer of overlapping sites of left female breast ([place])   12/26/2015 Initial Diagnosis    Cancer of overlapping sites of left female breast [facility])      This is my first interaction with the patient as patient's primary oncologist has been Dr.Pandit I reviewed the patient's prior charts/pertinent labs/imaging in detail; findings are summarized above.      INTERVAL HISTORY:  [name] 65 y.o.  female pleasant patient above history of early stage breast cancer currently on aromatase inhibitor is here for follow-up.  Denies any hot flashes. Denies any bone pain. No nausea no vomiting. No chest pain or shortness of breath cough. Chronic mild tingling and numbness of her extremities from neuropathy. Not any worse.  REVIEW OF SYSTEMS:  A complete 10 point review of system is done which is negative except mentioned above/history of present illness.   PAST MEDICAL HISTORY :  Past Medical History:  Diagnosis Date  . Allergic rhinitis   . Allergy   . Asthma   . Breast cancer ([place]) 2012   mastectomy with chemo and rad tx  . Diverticulosis   . Dysmenorrhea   . Hypercholesterolemia     PAST SURGICAL HISTORY :   Past Surgical History:  Procedure Laterality Date  . BREAST BIOPSY Right 2012   benign  . BREAST SURGERY  2011   breast cancer  . CESAREAN SECTION  1970  . [place]  . CESAREAN SECTION  1987  . COLONOSCOPY WITH PROPOFOL N/A 10/03/2015   Procedure: COLONOSCOPY WITH PROPOFOL;  Surgeon: Manya Silvas, MD;   Location: [facility] ENDOSCOPY;  Service: Endoscopy;  Laterality: N/A;  . MASTECTOMY Left 2012   with chemo and rad tx  . NASAL SINUS SURGERY  07/2001    FAMILY HISTORY :   Family History  Problem Relation Age of Onset  . Cirrhosis Brother     liver  . Breast cancer Paternal Aunt     2 aunts-60's    SOCIAL HISTORY:   Social History  Substance Use Topics  . Smoking status: Former Smoker    Types: Cigarettes  . Smokeless tobacco: Never Used  . Alcohol use No    ALLERGIES:  has No Known Allergies.  MEDICATIONS:  Current Outpatient Prescriptions  Medication Sig Dispense Refill  . Albuterol Sulfate (PROAIR HFA IN) 90 mcg. 2 puffs using inhaler four times a day as needed.    Marland Kitchen anastrozole (ARIMIDEX) 1 MG tablet Take 1 tablet (1 mg total) by mouth daily. 90 tablet 1  . Ascorbic Acid (VITAMIN C PO) Take by mouth.    Marland Kitchen azelastine (ASTELIN) 137 MCG/SPRAY nasal spray 137 mcg. Spray 2 sprays into both nostrils twice a day    . b complex vitamins capsule Take 1 capsule by mouth daily.    . budesonide-formoterol (SYMBICORT) 160-4.5 MCG/ACT inhaler Inhale 2 puffs into the lungs 2 (two) times daily. 3 Inhaler 1  . cetirizine (ZYRTEC) 10 MG tablet Take 10 mg by mouth daily.    Marland Kitchen  cholecalciferol (VITAMIN D) 400 UNITS TABS Take 400 Units by mouth daily. Chewable    . Cyanocobalamin (VITAMIN B12 PO) Take 500 mcg by mouth daily. Administer 1 tablet by mouth once a day    . fluticasone (FLONASE) 50 MCG/ACT nasal spray Use 1 spray into each nostril twice a day 48 g 3  . levalbuterol (XOPENEX HFA) 45 MCG/ACT inhaler Inhale 2 puffs four times a day as needed    . loratadine (CLARITIN) 10 MG tablet Take 1 tablet by mouth once a day as needed    . montelukast (SINGULAIR) 10 MG tablet Take 1 tablet (10 mg total) by mouth at bedtime. 1 tablet once a day 90 tablet 3  . omeprazole (PRILOSEC) 10 MG capsule Take 1 capsule (10 mg total) by mouth daily. 90 capsule 3  . pregabalin (LYRICA) 75 MG capsule Take 2  capsules (150 mg total) by mouth 2 (two) times daily. 360 capsule 3  . rosuvastatin (CRESTOR) 10 MG tablet Take 1 tablet (10 mg total) by mouth daily. 90 tablet 3  . sodium chloride (OCEAN) 0.65 % nasal spray Place 1 spray into the nose as needed for congestion.    . vitamin E 400 UNIT capsule Take 400 Units by mouth daily.     No current facility-administered medications for this visit.     PHYSICAL EXAMINATION: ECOG PERFORMANCE STATUS: 0 - Asymptomatic  BP 112/74 (BP Location: Left Arm, Patient Position: Sitting)   Pulse 83   Temp 97.4 F (36.3 C) (Tympanic)   Resp 17   Ht '5\' 4"'$  (1.626 m)   Wt 206 lb (93.4 kg)   BMI 35.36 kg/m   Filed Weights   12/26/15 1504  Weight: 206 lb (93.4 kg)    GENERAL: Well-nourished well-developed; Alert, no distress and comfortable.   AAccompanied by her granddaughter. EYES: no pallor or icterus OROPHARYNX: no thrush or ulceration; good dentition  NECK: supple, no masses felt LYMPH:  no palpable lymphadenopathy in the cervical, axillary or inguinal regions LUNGS: clear to auscultation and  No wheeze or crackles HEART/CVS: regular rate & rhythm and no murmurs; No lower extremity edema ABDOMEN:abdomen soft, non-tender and normal bowel sounds Musculoskeletal:no cyanosis of digits and no clubbing  PSYCH: alert & oriented x 3 with fluent speech NEURO: no focal motor/sensory deficits SKIN:  no rashes or significant lesions  Right and left BREAST exam [in the presence of nurse]- no unusual skin changes or dominant masses felt. Left mastectomy noted. No lumps or bumps. I have reviewed the data as listed    Component Value Date/Time   NA 140 12/26/2015 1448   NA 142 11/03/2011   K 3.9 12/26/2015 1448   CL 105 12/26/2015 1448   CO2 28 12/26/2015 1448   GLUCOSE 105 (H) 12/26/2015 1448   BUN 12 12/26/2015 1448   BUN 14 11/03/2011   CREATININE 0.68 12/26/2015 1448   CREATININE 1.04 12/18/2012 1406   CALCIUM 9.6 12/26/2015 1448   PROT 7.3  12/26/2015 1448   PROT 7.0 12/18/2012 1406   ALBUMIN 4.2 12/26/2015 1448   ALBUMIN 3.7 12/18/2012 1406   AST 31 12/26/2015 1448   AST 23 12/18/2012 1406   ALT 23 12/26/2015 1448   ALT 32 12/18/2012 1406   ALKPHOS 67 12/26/2015 1448   ALKPHOS 91 12/18/2012 1406   BILITOT 0.4 12/26/2015 1448   BILITOT 0.6 12/18/2012 1406   GFRNONAA >60 12/26/2015 1448   GFRNONAA 58 (L) 12/18/2012 1406   GFRAA >60 12/26/2015 1448  GFRAA >60 12/18/2012 1406    No results found for: SPEP, UPEP  Lab Results  Component Value Date   WBC 6.4 12/26/2015   NEUTROABS 3.6 12/26/2015   HGB 12.8 12/26/2015   HCT 35.5 12/26/2015   MCV 89.1 12/26/2015   PLT 167 12/26/2015      Chemistry      Component Value Date/Time   NA 140 12/26/2015 1448   NA 142 11/03/2011   K 3.9 12/26/2015 1448   CL 105 12/26/2015 1448   CO2 28 12/26/2015 1448   BUN 12 12/26/2015 1448   BUN 14 11/03/2011   CREATININE 0.68 12/26/2015 1448   CREATININE 1.04 12/18/2012 1406   GLU 95 11/03/2011      Component Value Date/Time   CALCIUM 9.6 12/26/2015 1448   ALKPHOS 67 12/26/2015 1448   ALKPHOS 91 12/18/2012 1406   AST 31 12/26/2015 1448   AST 23 12/18/2012 1406   ALT 23 12/26/2015 1448   ALT 32 12/18/2012 1406   BILITOT 0.4 12/26/2015 1448   BILITOT 0.6 12/18/2012 1406       RADIOGRAPHIC STUDIES: I have personally reviewed the radiological images as listed and agreed with the findings in the report. No results found.   ASSESSMENT & PLAN:  Cancer of overlapping sites of left female breast ([place]) Left breast Ca- stage I ER/PR positive HER-2/neu negative status post mastectomy currently on adjuvant aromatase inhibitor. Clinically no evidence of recurrence. Mammogram May 2017 normal.  # Tolerating aromatase inhibitor well. Discussed 5 versus 10 years of AI data. Check breast cancer index. For now continue AI.   # Chronic peripheral neuropathy stable.  # Follow-up with me in 6 months with labs. Labs today revealed  normal.   Orders Placed This Encounter  Procedures  . MM Digital Diagnostic Unilat R    Standing Status:   Future    Standing Expiration Date:   06/27/2017    Order Specific Question:   Reason for Exam (SYMPTOM  OR DIAGNOSIS REQUIRED)    Answer:   history of left breast cancer-- schedule after Aug 31, 2016    Order Specific Question:   Preferred imaging location?    Answer:   [facility]  . CBC with Differential    Standing Status:   Future    Standing Expiration Date:   12/25/2016  . Comprehensive metabolic panel    Standing Status:   Future    Standing Expiration Date:   06/27/2016   All questions were answered. The patient knows to call the clinic with any problems, questions or concerns.      Cammie Sickle, MD 12/26/2015 4:30 PM

## 2015-12-26 NOTE — Progress Notes (Signed)
RN Chaperoned provider with Breast Exam.   

## 2015-12-29 ENCOUNTER — Other Ambulatory Visit: Payer: Self-pay | Admitting: *Deleted

## 2015-12-29 ENCOUNTER — Telehealth: Payer: Self-pay | Admitting: *Deleted

## 2015-12-29 DIAGNOSIS — Z853 Personal history of malignant neoplasm of breast: Secondary | ICD-10-CM

## 2015-12-29 NOTE — Telephone Encounter (Signed)
-----   Message from Cammie Sickle, MD sent at 12/26/2015  7:09 PM EDT ----- Please inform all other labs are normal;  follow-up is planned.

## 2015-12-29 NOTE — Telephone Encounter (Signed)
Called patient and LVM that labs are normal.  Follow up as planned.

## 2016-01-02 ENCOUNTER — Encounter: Payer: Self-pay | Admitting: Internal Medicine

## 2016-01-09 ENCOUNTER — Ambulatory Visit: Payer: Medicare HMO | Admitting: Internal Medicine

## 2016-01-13 ENCOUNTER — Other Ambulatory Visit: Payer: Self-pay | Admitting: *Deleted

## 2016-01-13 DIAGNOSIS — G629 Polyneuropathy, unspecified: Secondary | ICD-10-CM

## 2016-01-13 NOTE — Telephone Encounter (Addendum)
She needs a new application filled out for [employer] patient assistance for her Lyrica and Letrozole and needs refill on her Lyrica

## 2016-01-16 MED ORDER — PREGABALIN 75 MG PO CAPS: 150.0000 mg | ORAL_CAPSULE | Freq: Two times a day (BID) | ORAL | 4 refills | Status: DC

## 2016-01-16 NOTE — Addendum Note (Signed)
Addended by: Sabino Gasser on: 01/16/2016 04:17 PM   Modules accepted: Orders

## 2016-01-16 NOTE — Telephone Encounter (Addendum)
Contacted patient back - "I don't need to reapply for this. I just need a new rx for lyrica to be faxed to  [facility] / Ph- 1 [phone]"  I told her that Dr. Jacinto Reap will do this for her. I thanked for her for clarifying the call.

## 2016-01-19 ENCOUNTER — Other Ambulatory Visit: Payer: Self-pay | Admitting: *Deleted

## 2016-01-19 DIAGNOSIS — G629 Polyneuropathy, unspecified: Secondary | ICD-10-CM

## 2016-01-19 MED ORDER — PREGABALIN 75 MG PO CAPS: 150.0000 mg | ORAL_CAPSULE | Freq: Two times a day (BID) | ORAL | 4 refills | Status: DC

## 2016-02-17 ENCOUNTER — Ambulatory Visit (INDEPENDENT_AMBULATORY_CARE_PROVIDER_SITE_OTHER): Payer: Medicare HMO | Admitting: Internal Medicine

## 2016-02-17 ENCOUNTER — Encounter: Payer: Self-pay | Admitting: Internal Medicine

## 2016-02-17 DIAGNOSIS — R739 Hyperglycemia, unspecified: Secondary | ICD-10-CM

## 2016-02-17 DIAGNOSIS — C50912 Malignant neoplasm of unspecified site of left female breast: Secondary | ICD-10-CM

## 2016-02-17 DIAGNOSIS — K219 Gastro-esophageal reflux disease without esophagitis: Secondary | ICD-10-CM | POA: Diagnosis not present

## 2016-02-17 DIAGNOSIS — E7800 Pure hypercholesterolemia, unspecified: Secondary | ICD-10-CM

## 2016-02-17 DIAGNOSIS — E669 Obesity, unspecified: Secondary | ICD-10-CM | POA: Diagnosis not present

## 2016-02-17 DIAGNOSIS — Z6835 Body mass index (BMI) 35.0-35.9, adult: Secondary | ICD-10-CM

## 2016-02-17 DIAGNOSIS — K5790 Diverticulosis of intestine, part unspecified, without perforation or abscess without bleeding: Secondary | ICD-10-CM

## 2016-02-17 DIAGNOSIS — Z9109 Other allergy status, other than to drugs and biological substances: Secondary | ICD-10-CM

## 2016-02-17 NOTE — Progress Notes (Signed)
Patient ID: Robin Hill, female   DOB: [birth year], 65 y.o.   MRN: [id number]   Subjective:    Patient ID: Robin Hill, female    DOB: 09-14-1950, 65 y.o.   MRN: [id number]  HPI  Patient here for a scheduled follow up.  She reports she is doing well.  Feels good.  No chest pain.  Tries to stay active.  No sob.  No acid reflux.  No abdominal pain or cramping.  Bowels stable.  Saw oncology 09/2015.  Recommended f/u in 6 months.     Past Medical History:  Diagnosis Date  . Allergic rhinitis   . Allergy   . Asthma   . Breast cancer ([place]) 2012   mastectomy with chemo and rad tx  . Diverticulosis   . Dysmenorrhea   . Hypercholesterolemia    Past Surgical History:  Procedure Laterality Date  . BREAST BIOPSY Right 2012   benign  . BREAST SURGERY  2011   breast cancer  . CESAREAN SECTION  1970  . [place]  . CESAREAN SECTION  1987  . COLONOSCOPY WITH PROPOFOL N/A 10/03/2015   Procedure: COLONOSCOPY WITH PROPOFOL;  Surgeon: Manya Silvas, MD;  Location: [facility] ENDOSCOPY;  Service: Endoscopy;  Laterality: N/A;  . MASTECTOMY Left 2012   with chemo and rad tx  . NASAL SINUS SURGERY  07/2001   Family History  Problem Relation Age of Onset  . Cirrhosis Brother     liver  . Breast cancer Paternal Aunt     2 aunts-60's   Social History   Social History  . Marital status: Divorced    Spouse name: N/A  . Number of children: N/A  . Years of education: N/A   Social History Main Topics  . Smoking status: Former Smoker    Types: Cigarettes  . Smokeless tobacco: Never Used  . Alcohol use No  . Drug use: No  . Sexual activity: Not Asked   Other Topics Concern  . None   Social History Narrative  . None    Outpatient Encounter Prescriptions as of 02/17/2016  Medication Sig  . Albuterol Sulfate (PROAIR HFA IN) 90 mcg. 2 puffs using inhaler four times a day as needed.  Marland Kitchen anastrozole (ARIMIDEX) 1 MG tablet Take 1 tablet (1 mg total) by mouth daily.  .  Ascorbic Acid (VITAMIN C PO) Take by mouth.  Marland Kitchen azelastine (ASTELIN) 137 MCG/SPRAY nasal spray 137 mcg. Spray 2 sprays into both nostrils twice a day  . b complex vitamins capsule Take 1 capsule by mouth daily.  . budesonide-formoterol (SYMBICORT) 160-4.5 MCG/ACT inhaler Inhale 2 puffs into the lungs 2 (two) times daily.  . cetirizine (ZYRTEC) 10 MG tablet Take 10 mg by mouth daily.  . cholecalciferol (VITAMIN D) 400 UNITS TABS Take 400 Units by mouth daily. Chewable  . Cyanocobalamin (VITAMIN B12 PO) Take 500 mcg by mouth daily. Administer 1 tablet by mouth once a day  . fluticasone (FLONASE) 50 MCG/ACT nasal spray Use 1 spray into each nostril twice a day  . levalbuterol (XOPENEX HFA) 45 MCG/ACT inhaler Inhale 2 puffs four times a day as needed  . loratadine (CLARITIN) 10 MG tablet Take 1 tablet by mouth once a day as needed  . montelukast (SINGULAIR) 10 MG tablet Take 1 tablet (10 mg total) by mouth at bedtime. 1 tablet once a day  . omeprazole (PRILOSEC) 10 MG capsule Take 1 capsule (10 mg total) by mouth daily.  . pregabalin (  LYRICA) 75 MG capsule Take 2 capsules (150 mg total) by mouth 2 (two) times daily.  . rosuvastatin (CRESTOR) 10 MG tablet Take 1 tablet (10 mg total) by mouth daily.  . sodium chloride (OCEAN) 0.65 % nasal spray Place 1 spray into the nose as needed for congestion.  . vitamin E 400 UNIT capsule Take 400 Units by mouth daily.   No facility-administered encounter medications on file as of 02/17/2016.     Review of Systems  Constitutional: Negative for appetite change and unexpected weight change.  HENT: Negative for congestion and sinus pressure.   Respiratory: Negative for cough, chest tightness and shortness of breath.   Cardiovascular: Negative for chest pain, palpitations and leg swelling.  Gastrointestinal: Negative for abdominal pain, diarrhea, nausea and vomiting.  Genitourinary: Negative for difficulty urinating and dysuria.  Musculoskeletal: Negative for  back pain and joint swelling.  Skin: Negative for color change and rash.  Neurological: Negative for dizziness, light-headedness and headaches.  Psychiatric/Behavioral: Negative for agitation and dysphoric mood.       Objective:     Blood pressure rechecked by me:  120/72  Physical Exam  Constitutional: She appears well-developed and well-nourished. No distress.  HENT:  Nose: Nose normal.  Mouth/Throat: Oropharynx is clear and moist.  Neck: Neck supple. No thyromegaly present.  Cardiovascular: Normal rate and regular rhythm.   Pulmonary/Chest: Breath sounds normal. No respiratory distress. She has no wheezes.  Abdominal: Soft. Bowel sounds are normal. There is no tenderness.  Musculoskeletal: She exhibits no edema or tenderness.  Lymphadenopathy:    She has no cervical adenopathy.  Skin: No rash noted. No erythema.  Psychiatric: She has a normal mood and affect. Her behavior is normal.    BP 118/66   Pulse 76   Temp 98.4 F (36.9 C) (Oral)   Ht 5\' 4"  (1.626 m)   Wt 209 lb 6.4 oz (95 kg)   SpO2 94%   BMI 35.94 kg/m  Wt Readings from Last 3 Encounters:  02/17/16 209 lb 6.4 oz (95 kg)  12/26/15 206 lb (93.4 kg)  10/03/15 209 lb (94.8 kg)     Lab Results  Component Value Date   WBC 6.4 12/26/2015   HGB 12.8 12/26/2015   HCT 35.5 12/26/2015   PLT 167 12/26/2015   GLUCOSE 105 (H) 12/26/2015   CHOL 188 09/11/2015   TRIG 71.0 09/11/2015   HDL 66.10 09/11/2015   LDLDIRECT 126.2 05/09/2013   LDLCALC 108 (H) 09/11/2015   ALT 23 12/26/2015   AST 31 12/26/2015   NA 140 12/26/2015   K 3.9 12/26/2015   CL 105 12/26/2015   CREATININE 0.68 12/26/2015   BUN 12 12/26/2015   CO2 28 12/26/2015   TSH 1.88 09/11/2015   HGBA1C 6.2 09/11/2015   MICROALBUR <0.7 09/09/2014       Assessment & Plan:   Problem List Items Addressed This Visit    Breast cancer ([facility])    Followed by oncology.  On arimidex.  Mammogram 09/2015 - normal.  Recommended f/u in 6 months.         Diverticulosis    Colonoscopy 10/03/15 - colon polyps x 2 - hyperplastic polyps.  Recommended f/u colonoscopy 10/2025.        Environmental allergies    Controlled on current regimen.  Follow.       GERD (gastroesophageal reflux disease)    On prilosec.  No upper symptoms.        Hypercholesterolemia    On crestor.  Low cholesterol diet and exercise.  Follow lipid panel and liver function tests.        Relevant Orders   Lipid panel   Hepatic function panel   Hyperglycemia    Low carb diet and exercise.  Follow met b and a1c.       Relevant Orders   Basic metabolic panel   Hemoglobin A1c   Obesity    Diet and exercise.  Follow.        Other Visit Diagnoses   None.      Einar Pheasant, MD

## 2016-02-17 NOTE — Progress Notes (Signed)
Pre visit review using our clinic review tool, if applicable. No additional management support is needed unless otherwise documented below in the visit note. 

## 2016-02-20 ENCOUNTER — Telehealth: Payer: Self-pay | Admitting: Internal Medicine

## 2016-02-20 NOTE — Telephone Encounter (Signed)
Pt called and stated that she did not get an prescription for the sports bra. Pt is going Monday to have this done. Could we fax over a script [facility] [place]. Their phone is [phone] .  Call pt @ [phone]

## 2016-02-20 NOTE — Telephone Encounter (Signed)
Please advise 

## 2016-02-20 NOTE — Telephone Encounter (Signed)
faxed

## 2016-02-20 NOTE — Telephone Encounter (Signed)
rx written and placed in your box.  

## 2016-02-22 ENCOUNTER — Encounter: Payer: Self-pay | Admitting: Internal Medicine

## 2016-02-22 NOTE — Assessment & Plan Note (Signed)
Low carb diet and exercise.  Follow met b and a1c.  

## 2016-02-22 NOTE — Assessment & Plan Note (Signed)
Diet and exercise.  Follow.  

## 2016-02-22 NOTE — Assessment & Plan Note (Signed)
Followed by oncology.  On arimidex.  Mammogram 09/2015 - normal.  Recommended f/u in 6 months.

## 2016-02-22 NOTE — Assessment & Plan Note (Signed)
On prilosec.  No upper symptoms.  

## 2016-02-22 NOTE — Assessment & Plan Note (Signed)
Controlled on current regimen.  Follow.  

## 2016-02-22 NOTE — Assessment & Plan Note (Signed)
Colonoscopy 10/03/15 - colon polyps x 2 - hyperplastic polyps.  Recommended f/u colonoscopy 10/2025.

## 2016-02-22 NOTE — Assessment & Plan Note (Signed)
On crestor.  Low cholesterol diet and exercise.  Follow lipid panel and liver function tests.   

## 2016-02-23 ENCOUNTER — Other Ambulatory Visit: Payer: Self-pay | Admitting: *Deleted

## 2016-02-23 MED ORDER — ANASTROZOLE 1 MG PO TABS: 1.0000 mg | ORAL_TABLET | Freq: Every day | ORAL | 1 refills | Status: DC

## 2016-02-24 ENCOUNTER — Other Ambulatory Visit: Payer: Self-pay | Admitting: *Deleted

## 2016-02-24 NOTE — Telephone Encounter (Signed)
filled day

## 2016-03-04 ENCOUNTER — Other Ambulatory Visit (INDEPENDENT_AMBULATORY_CARE_PROVIDER_SITE_OTHER): Payer: Medicare HMO

## 2016-03-04 DIAGNOSIS — R739 Hyperglycemia, unspecified: Secondary | ICD-10-CM

## 2016-03-04 DIAGNOSIS — E7800 Pure hypercholesterolemia, unspecified: Secondary | ICD-10-CM | POA: Diagnosis not present

## 2016-03-04 LAB — BASIC METABOLIC PANEL
BUN: 13 mg/dL (ref 6–23)
CHLORIDE: 104 meq/L (ref 96–112)
CO2: 33 mEq/L — ABNORMAL HIGH (ref 19–32)
Calcium: 9.9 mg/dL (ref 8.4–10.5)
Creatinine, Ser: 0.9 mg/dL (ref 0.40–1.20)
GFR: 80.56 mL/min (ref >60.00)
GLUCOSE: 109 mg/dL — AB (ref 70–99)
POTASSIUM: 4.6 meq/L (ref 3.5–5.1)
SODIUM: 141 meq/L (ref 135–145)

## 2016-03-04 LAB — HEMOGLOBIN A1C: HEMOGLOBIN A1C: 6 % (ref 4.6–6.5)

## 2016-03-04 LAB — LIPID PANEL
CHOL/HDL RATIO: 3
Cholesterol: 198 mg/dL (ref 0–200)
HDL: 78.7 mg/dL (ref >39.00)
LDL Cholesterol: 106 mg/dL — ABNORMAL HIGH (ref 0–99)
NONHDL: 119.26
TRIGLYCERIDES: 64 mg/dL (ref 0.0–149.0)
VLDL: 12.8 mg/dL (ref 0.0–40.0)

## 2016-03-04 LAB — HEPATIC FUNCTION PANEL
ALBUMIN: 4.3 g/dL (ref 3.5–5.2)
ALT: 18 U/L (ref 0–35)
AST: 22 U/L (ref 0–37)
Alkaline Phosphatase: 65 U/L (ref 39–117)
Bilirubin, Direct: 0.1 mg/dL (ref 0.0–0.3)
TOTAL PROTEIN: 6.9 g/dL (ref 6.0–8.3)
Total Bilirubin: 0.7 mg/dL (ref 0.2–1.2)

## 2016-03-08 ENCOUNTER — Encounter: Payer: Self-pay | Admitting: Internal Medicine

## 2016-04-08 ENCOUNTER — Other Ambulatory Visit: Payer: Self-pay | Admitting: *Deleted

## 2016-04-08 DIAGNOSIS — G629 Polyneuropathy, unspecified: Secondary | ICD-10-CM

## 2016-04-08 DIAGNOSIS — C50912 Malignant neoplasm of unspecified site of left female breast: Secondary | ICD-10-CM

## 2016-04-08 MED ORDER — PREGABALIN 75 MG PO CAPS: 150.0000 mg | ORAL_CAPSULE | Freq: Two times a day (BID) | ORAL | 4 refills | Status: DC

## 2016-05-11 IMAGING — MG MM DIGITAL SCREENING UNILAT*R* W/ TOMO W/ CAD
7 series · 8 of 15 positions shown · non-contrast
Comparison: Previous exam(s).

CLINICAL DATA: Screening.

EXAM:
2D DIGITAL SCREENING UNILATERAL RIGHT MAMMOGRAM WITH CAD AND ADJUNCT
TOMO

[R XCCL]
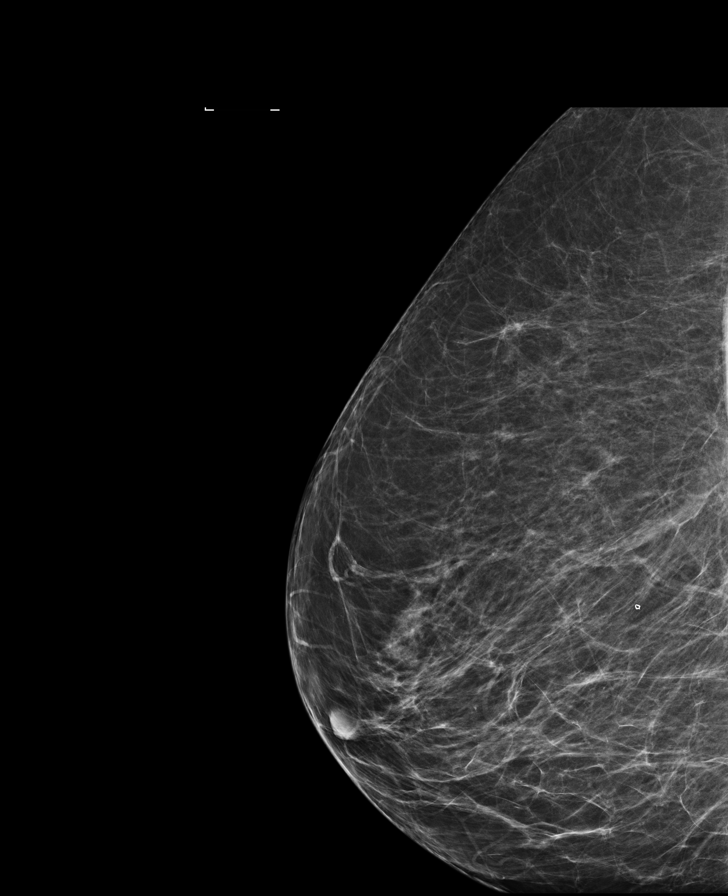

[R MLO synth-2D]
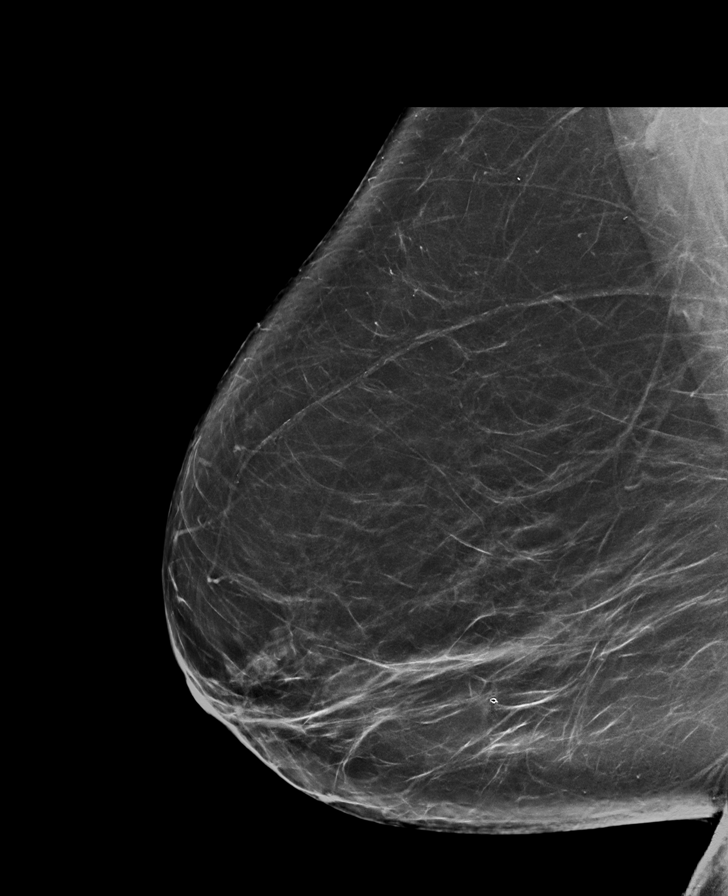

[R MLO]
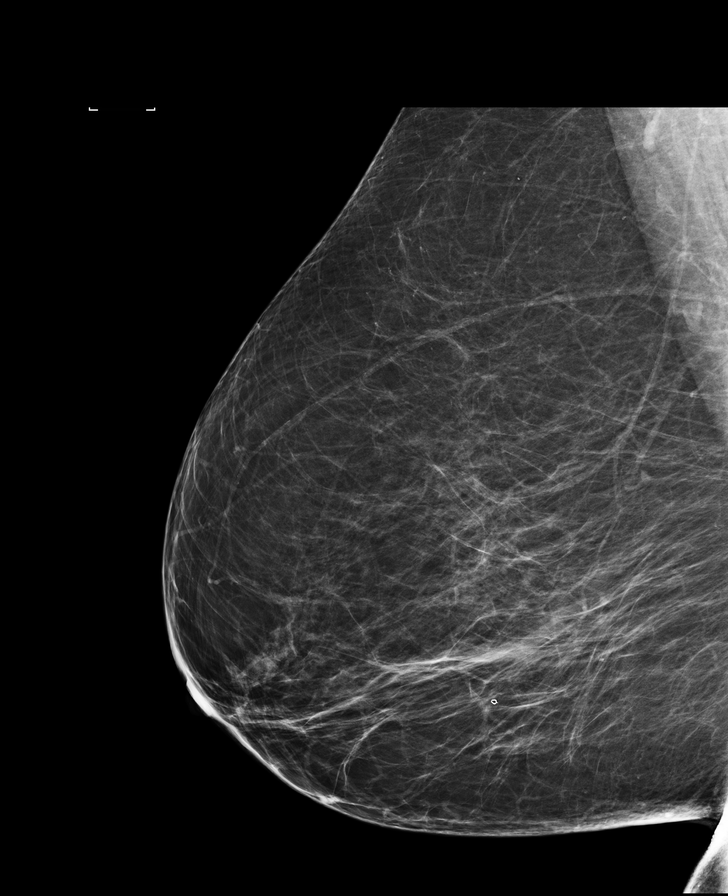

[R CC synth-2D]
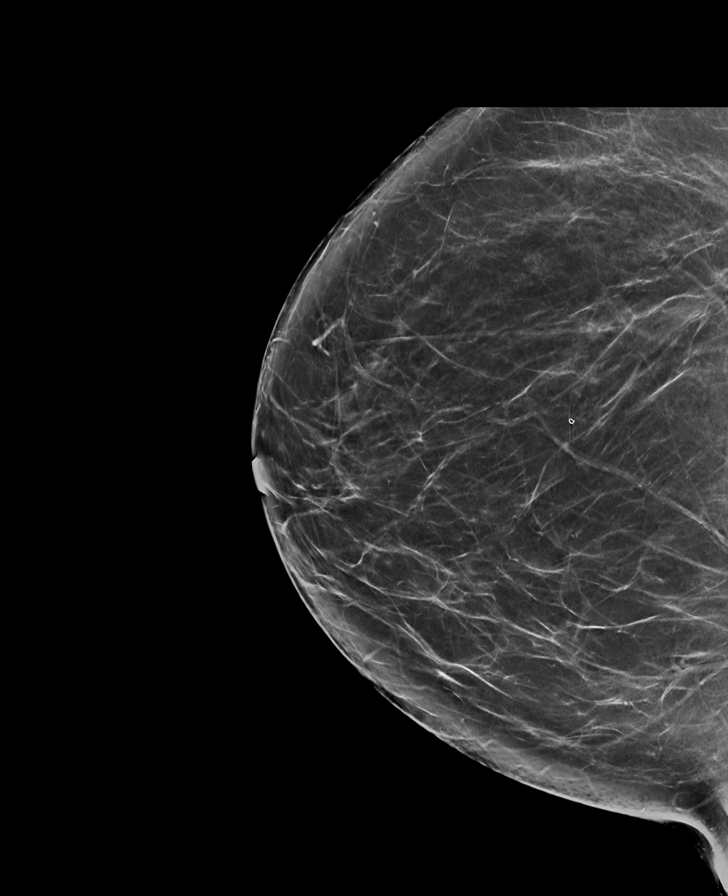

[R CC]
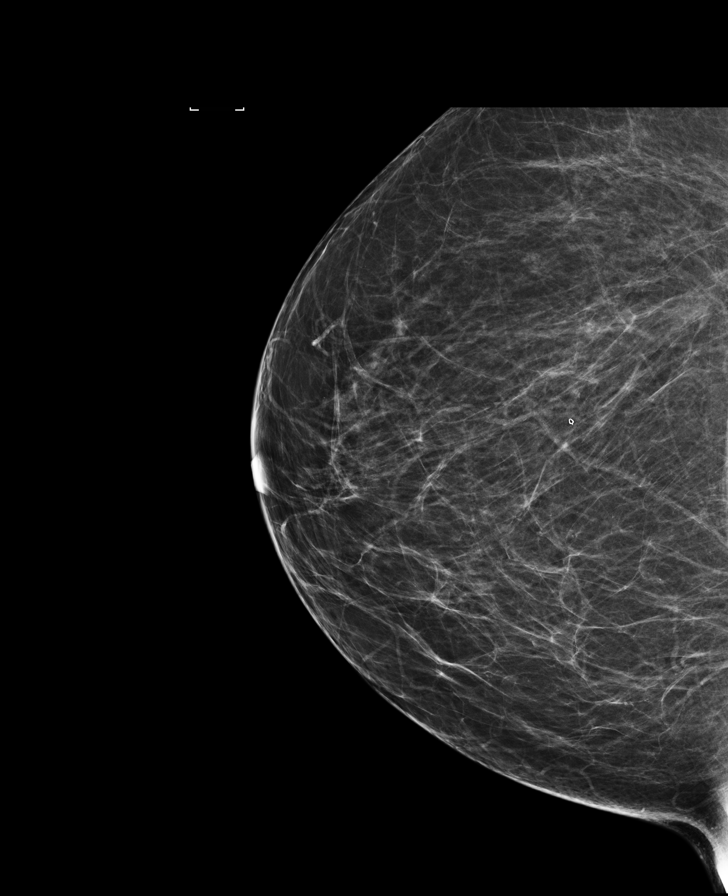

[R MLO tomo · 2 of 89 frames shown]
[frame 29/89]
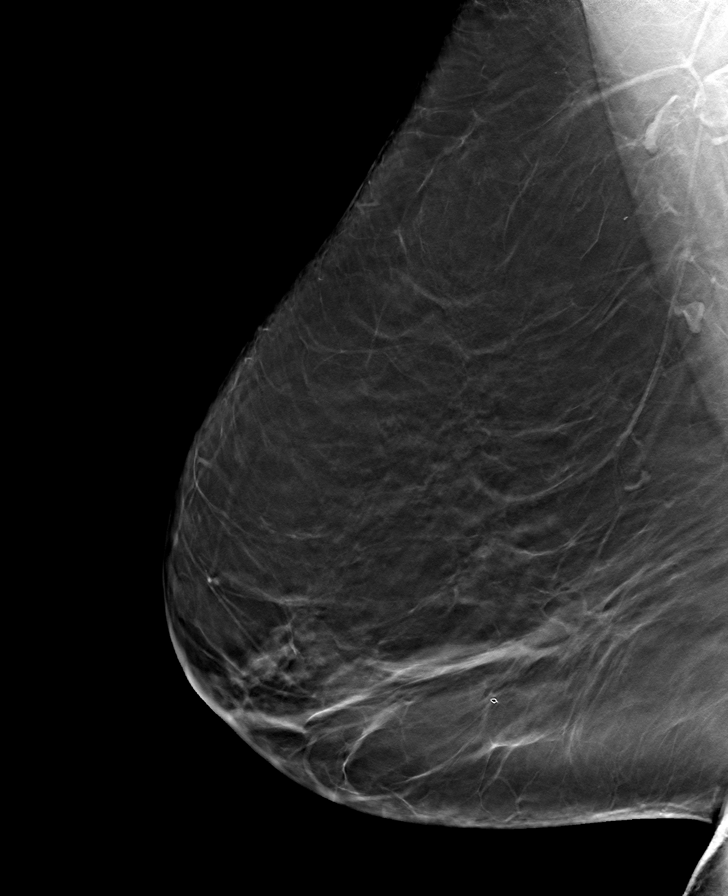
[frame 45/89]
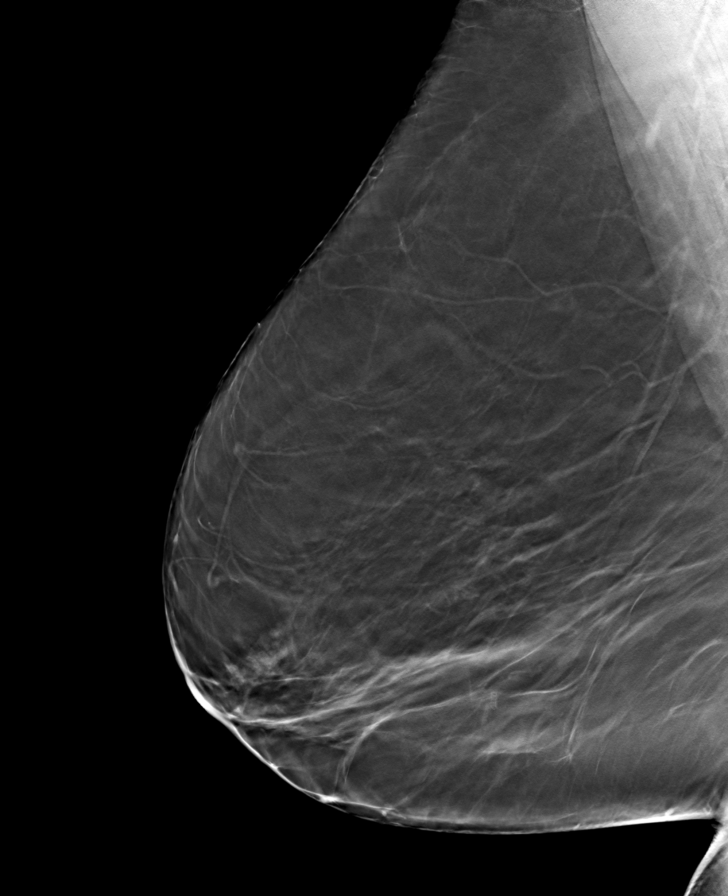

[R CC tomo · tomo slice 43/86.0]
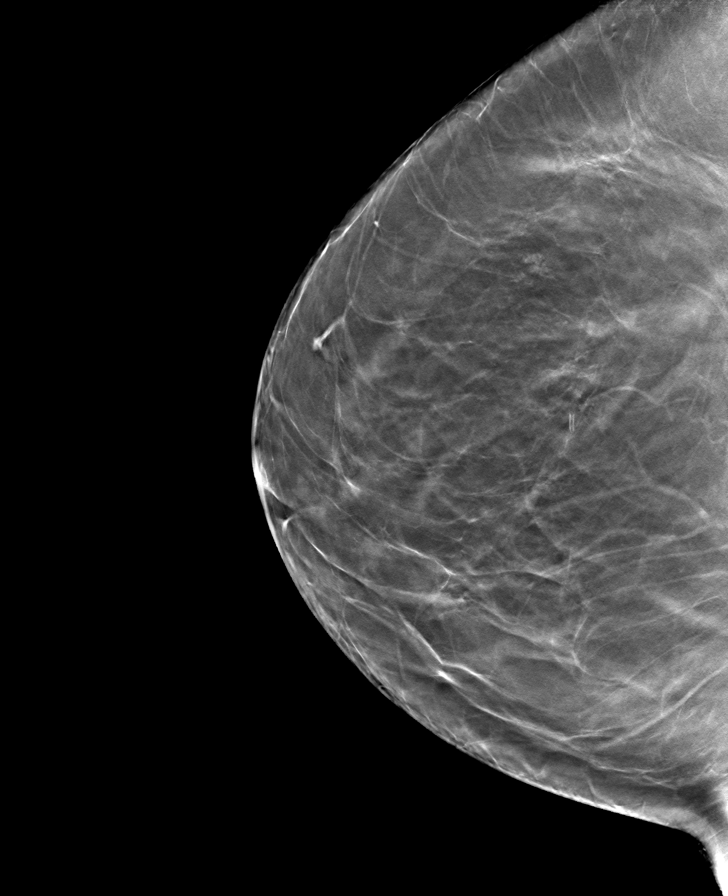

[8 of 15 positions shown; findings below may reference images not displayed]

ACR Breast Density Category b: There are scattered areas of
fibroglandular density.
FINDINGS: The patient has had a left mastectomy. There are no findings
suspicious for malignancy. Images were processed with CAD.
IMPRESSION: No mammographic evidence of malignancy. A result letter of this
screening mammogram will be mailed directly to the patient.

RECOMMENDATION:
Screening mammogram in one year.  (Code:[id number])

BI-RADS CATEGORY  1: Negative.

## 2016-05-19 ENCOUNTER — Other Ambulatory Visit: Payer: Self-pay | Admitting: Internal Medicine

## 2016-05-21 ENCOUNTER — Other Ambulatory Visit: Payer: Self-pay

## 2016-05-21 MED ORDER — MONTELUKAST SODIUM 10 MG PO TABS: 10.0000 mg | ORAL_TABLET | Freq: Every day | ORAL | 3 refills | Status: DC

## 2016-05-21 NOTE — Telephone Encounter (Signed)
Medication has been refilled.

## 2016-05-24 ENCOUNTER — Ambulatory Visit: Payer: Medicare HMO

## 2016-05-25 ENCOUNTER — Other Ambulatory Visit: Payer: Self-pay | Admitting: *Deleted

## 2016-05-25 MED ORDER — MONTELUKAST SODIUM 10 MG PO TABS: 10.0000 mg | ORAL_TABLET | Freq: Every day | ORAL | 3 refills | Status: DC

## 2016-05-28 ENCOUNTER — Ambulatory Visit (INDEPENDENT_AMBULATORY_CARE_PROVIDER_SITE_OTHER): Payer: Medicare Other

## 2016-05-28 VITALS — BP 118/62 | HR 82 | Temp 97.9°F | Resp 12 | Ht 64.0 in | Wt 210.4 lb

## 2016-05-28 DIAGNOSIS — Z0000 Encounter for general adult medical examination without abnormal findings: Secondary | ICD-10-CM

## 2016-05-28 NOTE — Progress Notes (Signed)
Subjective:   Robin Hill is a 66 y.o. female who presents for an Initial Medicare Annual Wellness Visit.  Review of Systems    No ROS.  Medicare Wellness Visit.  Cardiac Risk Factors include: advanced age (>15men, >23 women)     Objective:    Today's Vitals   05/28/16 1129  BP: 118/62  Pulse: 82  Resp: 12  Temp: 97.9 F (36.6 C)  TempSrc: Oral  SpO2: 98%  Weight: 210 lb 6.4 oz (95.4 kg)  Height: 5\' 4"  (1.626 m)   Body mass index is 36.12 kg/m.   Current Medications (verified) Outpatient Encounter Prescriptions as of 05/28/2016  Medication Sig  . Albuterol Sulfate (PROAIR HFA IN) 90 mcg. 2 puffs using inhaler four times a day as needed.  Marland Kitchen anastrozole (ARIMIDEX) 1 MG tablet TAKE 1 TABLET BY MOUTH  DAILY  . Ascorbic Acid (VITAMIN C PO) Take by mouth.  Marland Kitchen azelastine (ASTELIN) 137 MCG/SPRAY nasal spray 137 mcg. Spray 2 sprays into both nostrils twice a day  . b complex vitamins capsule Take 1 capsule by mouth daily.  . budesonide-formoterol (SYMBICORT) 160-4.5 MCG/ACT inhaler Inhale 2 puffs into the lungs 2 (two) times daily.  . cetirizine (ZYRTEC) 10 MG tablet Take 10 mg by mouth daily.  . cholecalciferol (VITAMIN D) 400 UNITS TABS Take 400 Units by mouth daily. Chewable  . Cyanocobalamin (VITAMIN B12 PO) Take 500 mcg by mouth daily. Administer 1 tablet by mouth once a day  . fluticasone (FLONASE) 50 MCG/ACT nasal spray Use 1 spray into each nostril twice a day  . levalbuterol (XOPENEX HFA) 45 MCG/ACT inhaler Inhale 2 puffs four times a day as needed  . loratadine (CLARITIN) 10 MG tablet Take 1 tablet by mouth once a day as needed  . montelukast (SINGULAIR) 10 MG tablet Take 1 tablet (10 mg total) by mouth at bedtime. 1 tablet once a day  . omeprazole (PRILOSEC) 10 MG capsule Take 1 capsule (10 mg total) by mouth daily.  . pregabalin (LYRICA) 75 MG capsule Take 2 capsules (150 mg total) by mouth 2 (two) times daily.  . rosuvastatin (CRESTOR) 10 MG tablet TAKE 1 TABLET  BY MOUTH  DAILY  . sodium chloride (OCEAN) 0.65 % nasal spray Place 1 spray into the nose as needed for congestion.  . vitamin E 400 UNIT capsule Take 400 Units by mouth daily.  . [DISCONTINUED] rosuvastatin (CRESTOR) 10 MG tablet Take 1 tablet (10 mg total) by mouth daily.   No facility-administered encounter medications on file as of 05/28/2016.     Allergies (verified) Patient has no known allergies.   History: Past Medical History:  Diagnosis Date  . Allergic rhinitis   . Allergy   . Asthma   . Breast cancer ([place]) 2012   mastectomy with chemo and rad tx  . Diverticulosis   . Dysmenorrhea   . Hypercholesterolemia    Past Surgical History:  Procedure Laterality Date  . BREAST BIOPSY Right 2012   benign  . BREAST SURGERY  2011   breast cancer  . CESAREAN SECTION  1970  . [place]  . CESAREAN SECTION  1987  . COLONOSCOPY WITH PROPOFOL N/A 10/03/2015   Procedure: COLONOSCOPY WITH PROPOFOL;  Surgeon: Manya Silvas, MD;  Location: [facility]COPY;  Service: Endoscopy;  Laterality: N/A;  . MASTECTOMY Left 2012   with chemo and rad tx  . NASAL SINUS SURGERY  07/2001   Family History  Problem Relation Age of Onset  .  Cirrhosis Brother     liver  . Breast cancer Paternal Aunt     2 aunts   Social History   Occupational History  . Not on file.   Social History Main Topics  . Smoking status: Former Smoker    Types: Cigarettes  . Smokeless tobacco: Never Used  . Alcohol use 0.6 oz/week    1 Glasses of wine per week     Comment: OCC  . Drug use: No  . Sexual activity: No    Tobacco Counseling Counseling given: Not Answered   Activities of Daily Living In your present state of health, do you have any difficulty performing the following activities: 05/28/2016  Hearing? N  Vision? N  Difficulty concentrating or making decisions? N  Walking or climbing stairs? N  Dressing or bathing? N  Doing errands, shopping? N  Preparing Food and eating ? N    Using the Toilet? N  In the past six months, have you accidently leaked urine? N  Do you have problems with loss of bowel control? N  Managing your Medications? N  Managing your Finances? N  Housekeeping or managing your Housekeeping? N  Some recent data might be hidden    Immunizations and Health Maintenance Immunization History  Administered Date(s) Administered  . Influenza Split 02/22/2014  . Influenza-Unspecified 02/09/2013, 02/24/2015, 12/09/2015  . Pneumococcal Polysaccharide-23 02/24/2015  . Tdap 12/09/2015   Health Maintenance Due  Topic Date Due  . Hepatitis C Screening  1950-11-23  . ZOSTAVAX  02/26/2010  . PNA vac Low Risk Adult (1 of 2 - PCV13) 02/24/2016    Patient Care Team: Einar Pheasant, MD as PCP - General (Internal Medicine)  Indicate any recent Medical Services you may have received from other than [facility] providers in the past year (date may be approximate).     Assessment:   This is a routine wellness examination for Robin Hill. The goal of the wellness visit is to assist the patient how to close the gaps in care and create a preventative care plan for the patient.   Taking calcium VIT D as appropriate/Osteoporosis risk reviewed.  Medications reviewed; taking without issues or barriers.  Safety issues reviewed; smoke detectors in the home. No firearms in the home. Wears seatbelts when driving or riding with others. No violence in the home.  No identified risk were noted; The patient was oriented x 3; appropriate in dress and manner and no objective failures at ADL's or IADL's.   BMI; discussed the importance of a healthy diet, water intake and exercise. Educational material provided.  HTN; followed by PCP.  Prevnar 13 vaccine discussed.  She plans to receive the vaccine at her local pharmacy and will update accordingly.  Educational material provided.  Shingles vaccine discussed; she plans to consult with her [place] physician to confirm if she is  a candidate. Educational material provided.   Patient Concerns: None at this time. Follow up with PCP as needed.  Hearing/Vision screen Hearing Screening Comments: Patient passes the whisper test Vision Screening Comments: Followed by [facility] Wears glasses Visual acuity not assessed per patient preference since she has regular follow up with her ophthalmologist  Dietary issues and exercise activities discussed: Current Exercise Habits: Structured exercise class (Swimming. Cycling.), Type of exercise: exercise ball;calisthenics;walking;strength training/weights;stretching, Time (Minutes): > 60, Frequency (Times/Week): 4, Weekly Exercise (Minutes/Week): 0, Intensity: Intense  Goals    . Maintain Healthy Lifestyle          Stay hydrated and drink  plenty of fluids/water Stay active and continue exercise regimen Low carb foods.  Lean meats, vegetables.  Educational material provided.      Depression Screen PHQ 2/9 Scores 05/28/2016 02/17/2016 09/04/2015 08/28/2014  PHQ - 2 Score 0 0 0 0    Fall Risk Fall Risk  05/28/2016 02/17/2016 09/04/2015 08/28/2014  Falls in the past year? No No No No    Cognitive Function:     6CIT Screen 05/28/2016  What Year? 0 points  What month? 0 points  What time? 0 points  Count back from 20 0 points  Months in reverse 0 points  Repeat phrase 0 points  Total Score 0    Screening Tests Health Maintenance  Topic Date Due  . Hepatitis C Screening  1951-02-21  . ZOSTAVAX  02/26/2010  . PNA vac Low Risk Adult (1 of 2 - PCV13) 02/24/2016  . MAMMOGRAM  08/31/2016  . COLONOSCOPY  10/02/2025  . TETANUS/TDAP  12/08/2025  . INFLUENZA VACCINE  Completed  . DEXA SCAN  Completed      Plan:    End of life planning; Advance aging; Advanced directives discussed. Copy of current HCPOA/Living Will on file.  Medicare Attestation I have personally reviewed: The patient's medical and social history Their use of alcohol, tobacco or illicit  drugs Their current medications and supplements The patient's functional ability including ADLs,fall risks, home safety risks, cognitive, and hearing and visual impairment Diet and physical activities Evidence for depression   The patient's weight, height, BMI, and visual acuity have been recorded in the chart.  I have made referrals and provided education to the patient based on review of the above and I have provided the patient with a written personalized care plan for preventive services.    During the course of the visit, [place] was educated and counseled about the following appropriate screening and preventive services:   Vaccines to include Pneumoccal, Influenza, Hepatitis B, Td, Zostavax, HCV  Electrocardiogram  Cardiovascular disease screening  Colorectal cancer screening  Bone density screening  Diabetes screening  Glaucoma screening  Mammography/PAP  Nutrition counseling  Smoking cessation counseling  Patient Instructions (the written plan) were given to the patient.    Varney Biles, LPN   [id number]    Reviewed above information.  Agree with plan.  Dr Nicki Reaper

## 2016-05-28 NOTE — Patient Instructions (Addendum)
[name] , Thank you for taking time to come for your Medicare Wellness Visit. I appreciate your ongoing commitment to your health goals. Please review the following plan we discussed and let me know if I can assist you in the future.   Follow up with Dr. Nicki Reaper as needed.  Prevnar 13 vaccine  Shingles vaccine  These are the goals we discussed: Goals    . Maintain Healthy Lifestyle          Stay hydrated and drink plenty of fluids/water Stay active and continue exercise regimen Low carb foods.  Lean meats, vegetables.  Educational material provided.       This is a list of the screening recommended for you and due dates:  Health Maintenance  Topic Date Due  .  Hepatitis C: One time screening is recommended by Center for Disease Control  (CDC) for  adults born from [birth year] through 1966.   [birth year]  . Shingles Vaccine  02/26/2010  . Pneumonia vaccines (1 of 2 - PCV13) 02/24/2016  . Mammogram  08/31/2016  . Colon Cancer Screening  10/02/2025  . Tetanus Vaccine  12/08/2025  . Flu Shot  Completed  . DEXA scan (bone density measurement)  Completed    Fall Prevention in the Home Introduction Falls can cause injuries. They can happen to people of all ages. There are many things you can do to make your home safe and to help prevent falls. What can I do on the outside of my home?  Regularly fix the edges of walkways and driveways and fix any cracks.  Remove anything that might make you trip as you walk through a door, such as a raised step or threshold.  Trim any bushes or trees on the path to your home.  Use bright outdoor lighting.  Clear any walking paths of anything that might make someone trip, such as rocks or tools.  Regularly check to see if handrails are loose or broken. Make sure that both sides of any steps have handrails.  Any raised decks and porches should have guardrails on the edges.  Have any leaves, snow, or ice cleared regularly.  Use sand or salt on  walking paths during winter.  Clean up any spills in your garage right away. This includes oil or grease spills. What can I do in the bathroom?  Use night lights.  Install grab bars by the toilet and in the tub and shower. Do not use towel bars as grab bars.  Use non-skid mats or decals in the tub or shower.  If you need to sit down in the shower, use a plastic, non-slip stool.  Keep the floor dry. Clean up any water that spills on the floor as soon as it happens.  Remove soap buildup in the tub or shower regularly.  Attach bath mats securely with double-sided non-slip rug tape.  Do not have throw rugs and other things on the floor that can make you trip. What can I do in the bedroom?  Use night lights.  Make sure that you have a light by your bed that is easy to reach.  Do not use any sheets or blankets that are too big for your bed. They should not hang down onto the floor.  Have a firm chair that has side arms. You can use this for support while you get dressed.  Do not have throw rugs and other things on the floor that can make you trip. What can I do in  the kitchen?  Clean up any spills right away.  Avoid walking on wet floors.  Keep items that you use a lot in easy-to-reach places.  If you need to reach something above you, use a strong step stool that has a grab bar.  Keep electrical cords out of the way.  Do not use floor polish or wax that makes floors slippery. If you must use wax, use non-skid floor wax.  Do not have throw rugs and other things on the floor that can make you trip. What can I do with my stairs?  Do not leave any items on the stairs.  Make sure that there are handrails on both sides of the stairs and use them. Fix handrails that are broken or loose. Make sure that handrails are as long as the stairways.  Check any carpeting to make sure that it is firmly attached to the stairs. Fix any carpet that is loose or worn.  Avoid having throw  rugs at the top or bottom of the stairs. If you do have throw rugs, attach them to the floor with carpet tape.  Make sure that you have a light switch at the top of the stairs and the bottom of the stairs. If you do not have them, ask someone to add them for you. What else can I do to help prevent falls?  Wear shoes that:  Do not have high heels.  Have rubber bottoms.  Are comfortable and fit you well.  Are closed at the toe. Do not wear sandals.  If you use a stepladder:  Make sure that it is fully opened. Do not climb a closed stepladder.  Make sure that both sides of the stepladder are locked into place.  Ask someone to hold it for you, if possible.  Clearly mark and make sure that you can see:  Any grab bars or handrails.  First and last steps.  Where the edge of each step is.  Use tools that help you move around (mobility aids) if they are needed. These include:  Canes.  Walkers.  Scooters.  Crutches.  Turn on the lights when you go into a dark area. Replace any light bulbs as soon as they burn out.  Set up your furniture so you have a clear path. Avoid moving your furniture around.  If any of your floors are uneven, fix them.  If there are any pets around you, be aware of where they are.  Review your medicines with your doctor. Some medicines can make you feel dizzy. This can increase your chance of falling. Ask your doctor what other things that you can do to help prevent falls. This information is not intended to replace advice given to you by your health care provider. Make sure you discuss any questions you have with your health care provider. Document Released: 02/13/2009 Document Revised: 09/25/2015 Document Reviewed: 05/24/2014  2017 [employer]

## 2016-06-22 ENCOUNTER — Ambulatory Visit: Payer: Medicare HMO | Admitting: Internal Medicine

## 2016-06-28 ENCOUNTER — Inpatient Hospital Stay: Payer: Medicare Other | Attending: Internal Medicine | Admitting: Internal Medicine

## 2016-06-28 ENCOUNTER — Inpatient Hospital Stay: Payer: Medicare Other

## 2016-06-28 VITALS — BP 108/65 | HR 83 | Temp 96.6°F | Wt 209.0 lb

## 2016-06-28 DIAGNOSIS — Z79811 Long term (current) use of aromatase inhibitors: Secondary | ICD-10-CM | POA: Insufficient documentation

## 2016-06-28 DIAGNOSIS — G629 Polyneuropathy, unspecified: Secondary | ICD-10-CM | POA: Insufficient documentation

## 2016-06-28 DIAGNOSIS — Z87891 Personal history of nicotine dependence: Secondary | ICD-10-CM | POA: Insufficient documentation

## 2016-06-28 DIAGNOSIS — G8928 Other chronic postprocedural pain: Secondary | ICD-10-CM | POA: Diagnosis not present

## 2016-06-28 DIAGNOSIS — C50812 Malignant neoplasm of overlapping sites of left female breast: Secondary | ICD-10-CM

## 2016-06-28 DIAGNOSIS — E7800 Pure hypercholesterolemia, unspecified: Secondary | ICD-10-CM | POA: Diagnosis not present

## 2016-06-28 DIAGNOSIS — R232 Flushing: Secondary | ICD-10-CM | POA: Insufficient documentation

## 2016-06-28 DIAGNOSIS — Z170 Estrogen receptor positive status [ER+]: Secondary | ICD-10-CM | POA: Diagnosis not present

## 2016-06-28 DIAGNOSIS — Z79899 Other long term (current) drug therapy: Secondary | ICD-10-CM | POA: Diagnosis not present

## 2016-06-28 DIAGNOSIS — Z803 Family history of malignant neoplasm of breast: Secondary | ICD-10-CM | POA: Diagnosis not present

## 2016-06-28 DIAGNOSIS — Z9012 Acquired absence of left breast and nipple: Secondary | ICD-10-CM | POA: Insufficient documentation

## 2016-06-28 LAB — CBC WITH DIFFERENTIAL/PLATELET
BASOS ABS: 0.1 10*3/uL (ref 0–0.1)
BASOS PCT: 1 %
EOS ABS: 0.4 10*3/uL (ref 0–0.7)
Eosinophils Relative: 5 %
HEMATOCRIT: 37.4 % (ref 35.0–47.0)
Hemoglobin: 12.7 g/dL (ref 12.0–16.0)
Lymphocytes Relative: 26 %
Lymphs Abs: 1.9 10*3/uL (ref 1.0–3.6)
MCH: 30.5 pg (ref 26.0–34.0)
MCHC: 33.8 g/dL (ref 32.0–36.0)
MCV: 90.1 fL (ref 80.0–100.0)
MONO ABS: 0.5 10*3/uL (ref 0.2–0.9)
Monocytes Relative: 7 %
NEUTROS ABS: 4.4 10*3/uL (ref 1.4–6.5)
Neutrophils Relative %: 61 %
PLATELETS: 183 10*3/uL (ref 150–440)
RBC: 4.15 MIL/uL (ref 3.80–5.20)
RDW: 12.8 % (ref 11.5–14.5)
WBC: 7.1 10*3/uL (ref 3.6–11.0)

## 2016-06-28 NOTE — Progress Notes (Signed)
Patient here today for follow up.  Patient's PCP told patient to consult if okay to receive zostavax vaccination.

## 2016-06-28 NOTE — Progress Notes (Signed)
[place] OFFICE PROGRESS NOTE  Patient Care Team: Einar Pheasant, MD as PCP - General (Internal Medicine)  Cancer Staging No matching staging information was found for the patient.   Oncology History   # 2011- LEFT BREAST [Dr.Smith; Dr.Pandit] Stage I IDC with lobular features pT1c (1.9cm) pSNmi-s/p Lumpec & RT; AC- Taxol x10 [sec to PN]; ER/PR-Pos; Her 2 NEU. 2012- DCIS s/p mastectomy- on AI' check [facility] [Aug 2017]  # PN- G2- on Lyrica     Carcinoma of overlapping sites of left breast in female, estrogen receptor positive ([place])   12/26/2015 Initial Diagnosis    Cancer of overlapping sites of left female breast ([place])       INTERVAL HISTORY:  Robin Hill 66 y.o. female pleasant patient above history of early stage breast cancer currently on aromatase inhibitor for past 5.5 years is here for follow-up and to discuss whether to complete AI therapy or continue for another 5 years.  She reports hot flashes approximately twice weekly, when her head gets hot at night. Denies any bone pain. No nausea no vomiting. Occasional left-sided chest discomfort at surgical site after exercising, no shortness of breath cough. Chronic moderate tingling and numbness of her extremities from neuropathy, affecting her ability to pick up fine objects, otherwise not affective her daily activities. Not any worse.  Patient asking if it's okay for her to get shingles vaccine as recommended by her PCP.  REVIEW OF SYSTEMS:  A complete 10 point review of system is done which is negative except mentioned above/history of present illness.   PAST MEDICAL HISTORY :  Past Medical History:  Diagnosis Date  . Allergic rhinitis   . Allergy   . Asthma   . Breast cancer ([place]) 2012   mastectomy with chemo and rad tx  . Diverticulosis   . Dysmenorrhea   . Hypercholesterolemia     PAST SURGICAL HISTORY :   Past Surgical History:  Procedure Laterality Date  . BREAST BIOPSY Right 2012   benign   . BREAST SURGERY  2011   breast cancer  . CESAREAN SECTION  1970  . [place]  . CESAREAN SECTION  1987  . COLONOSCOPY WITH PROPOFOL N/A 10/03/2015   Procedure: COLONOSCOPY WITH PROPOFOL;  Surgeon: Manya Silvas, MD;  Location: [facility] ENDOSCOPY;  Service: Endoscopy;  Laterality: N/A;  . MASTECTOMY Left 2012   with chemo and rad tx  . NASAL SINUS SURGERY  07/2001    FAMILY HISTORY :   Family History  Problem Relation Age of Onset  . Cirrhosis Brother     liver  . Breast cancer Paternal Aunt     2 aunts    SOCIAL HISTORY:   Social History  Substance Use Topics  . Smoking status: Former Smoker    Types: Cigarettes  . Smokeless tobacco: Never Used  . Alcohol use 0.6 oz/week    1 Glasses of wine per week     Comment: OCC    ALLERGIES:  has No Known Allergies.  MEDICATIONS:  Current Outpatient Prescriptions  Medication Sig Dispense Refill  . Albuterol Sulfate (PROAIR HFA IN) 90 mcg. 2 puffs using inhaler four times a day as needed.    Marland Kitchen anastrozole (ARIMIDEX) 1 MG tablet TAKE 1 TABLET BY MOUTH  DAILY 90 tablet 0  . Ascorbic Acid (VITAMIN C PO) Take by mouth.    Marland Kitchen azelastine (ASTELIN) 137 MCG/SPRAY nasal spray 137 mcg. Spray 2 sprays into both nostrils twice a  day    . b complex vitamins capsule Take 1 capsule by mouth daily.    . budesonide-formoterol (SYMBICORT) 160-4.5 MCG/ACT inhaler Inhale 2 puffs into the lungs 2 (two) times daily. 3 Inhaler 1  . cholecalciferol (VITAMIN D) 400 UNITS TABS Take 400 Units by mouth daily. Chewable    . Cyanocobalamin (VITAMIN B12 PO) Take 500 mcg by mouth daily. Administer 1 tablet by mouth once a day    . fluticasone (FLONASE) 50 MCG/ACT nasal spray Use 1 spray into each nostril twice a day 48 g 3  . levalbuterol (XOPENEX HFA) 45 MCG/ACT inhaler Inhale 2 puffs four times a day as needed    . montelukast (SINGULAIR) 10 MG tablet Take 1 tablet (10 mg total) by mouth at bedtime. 1 tablet once a day 90 tablet 3  . omeprazole  (PRILOSEC) 10 MG capsule Take 1 capsule (10 mg total) by mouth daily. 90 capsule 3  . pregabalin (LYRICA) 75 MG capsule Take 2 capsules (150 mg total) by mouth 2 (two) times daily. 360 capsule 4  . rosuvastatin (CRESTOR) 10 MG tablet TAKE 1 TABLET BY MOUTH  DAILY 90 tablet 1  . sodium chloride (OCEAN) 0.65 % nasal spray Place 1 spray into the nose as needed for congestion.    . vitamin E 400 UNIT capsule Take 400 Units by mouth daily.     No current facility-administered medications for this visit.     PHYSICAL EXAMINATION: ECOG PERFORMANCE STATUS: 0 - Asymptomatic  BP 108/65 (BP Location: Right Arm, Patient Position: Sitting)   Pulse 83   Temp (!) 96.6 F (35.9 C) (Tympanic)   Wt 209 lb (94.8 kg)   BMI 35.87 kg/m   Filed Weights   06/28/16 1415  Weight: 209 lb (94.8 kg)    GENERAL: Well-nourished well-developed; Alert, no distress and comfortable.   EYES: no pallor or icterus OROPHARYNX: no thrush or ulceration; good dentition  NECK: supple, no masses felt LYMPH:  no palpable lymphadenopathy in the cervical, axillary or inguinal regions LUNGS: clear to auscultation and  No wheeze or crackles HEART/CVS: regular rate & rhythm and no murmurs; No lower extremity edema ABDOMEN:abdomen soft, non-tender and normal bowel sounds Musculoskeletal:no cyanosis of digits and no clubbing  PSYCH: alert & oriented x 3 with fluent speech NEURO: no focal motor/sensory deficits SKIN:  no rashes or significant lesions  Right and left BREAST exam [in the presence of nurse]- no unusual skin changes or dominant masses felt. Left mastectomy noted with well-healed scar. No lumps or bumps.  I have reviewed the data as listed    Component Value Date/Time   NA 141 03/04/2016 0823   NA 142 11/03/2011   K 4.6 03/04/2016 0823   CL 104 03/04/2016 0823   CO2 33 (H) 03/04/2016 0823   GLUCOSE 109 (H) 03/04/2016 0823   BUN 13 03/04/2016 0823   BUN 14 11/03/2011   CREATININE 0.90 03/04/2016 0823    CREATININE 1.04 12/18/2012 1406   CALCIUM 9.9 03/04/2016 0823   PROT 6.9 03/04/2016 0823   PROT 7.0 12/18/2012 1406   ALBUMIN 4.3 03/04/2016 0823   ALBUMIN 3.7 12/18/2012 1406   AST 22 03/04/2016 0823   AST 23 12/18/2012 1406   ALT 18 03/04/2016 0823   ALT 32 12/18/2012 1406   ALKPHOS 65 03/04/2016 0823   ALKPHOS 91 12/18/2012 1406   BILITOT 0.7 03/04/2016 0823   BILITOT 0.6 12/18/2012 1406   GFRNONAA >60 12/26/2015 1448   GFRNONAA 58 (L) 12/18/2012  1406   GFRAA >60 12/26/2015 1448   GFRAA >60 12/18/2012 1406    No results found for: SPEP, UPEP  Lab Results  Component Value Date   WBC 7.1 06/28/2016   NEUTROABS 4.4 06/28/2016   HGB 12.7 06/28/2016   HCT 37.4 06/28/2016   MCV 90.1 06/28/2016   PLT 183 06/28/2016      Chemistry      Component Value Date/Time   NA 141 03/04/2016 0823   NA 142 11/03/2011   K 4.6 03/04/2016 0823   CL 104 03/04/2016 0823   CO2 33 (H) 03/04/2016 0823   BUN 13 03/04/2016 0823   BUN 14 11/03/2011   CREATININE 0.90 03/04/2016 0823   CREATININE 1.04 12/18/2012 1406   GLU 95 11/03/2011      Component Value Date/Time   CALCIUM 9.9 03/04/2016 0823   ALKPHOS 65 03/04/2016 0823   ALKPHOS 91 12/18/2012 1406   AST 22 03/04/2016 0823   AST 23 12/18/2012 1406   ALT 18 03/04/2016 0823   ALT 32 12/18/2012 1406   BILITOT 0.7 03/04/2016 0823   BILITOT 0.6 12/18/2012 1406       RADIOGRAPHIC STUDIES: I have personally reviewed the radiological images as listed and agreed with the findings in the report. No results found.   ASSESSMENT & PLAN:  Carcinoma of overlapping sites of left breast in female, estrogen receptor positive ([place]) Left breast Ca- stage I ER/PR positive HER-2/neu negative status post mastectomy currently on adjuvant aromatase inhibitor. Clinically no evidence of recurrence. Mammogram May 2017 normal.  # Tolerating aromatase inhibitor well. 5 versus 10 years of AI data previously discussed. Breast cancer index shows low risk  of late recurrence, but high likelihood of benefit from extended endocrine therapy. Plan to continue arimidex for total of 10 years (through August 2022).  # Chronic peripheral neuropathy stable. Continue Lyrica as currently taking (40m BID)  # Follow-up with me in 6 months with labs, bone density scan a few days prior.  Repeat mammogram in May 2018.  Awaiting lab results today. OK to get shingles vaccine per PCP.   Orders Placed This Encounter  Procedures  . DG Bone Density    Standing Status:   Future    Standing Expiration Date:   06/28/2017    Order Specific Question:   Reason for Exam (SYMPTOM  OR DIAGNOSIS REQUIRED)    Answer:   taking aromatase inhibitor    Order Specific Question:   Preferred imaging location?    Answer:   [facility]  . MM Digital Diagnostic Unilat R    Standing Status:   Future    Standing Expiration Date:   06/28/2017    Order Specific Question:   Reason for Exam (SYMPTOM  OR DIAGNOSIS REQUIRED)    Answer:   history of breast cancer    Order Specific Question:   Preferred imaging [facility]?    [facility]  All questions were answered. The patient knows to call the clinic with any problems, questions or concerns.   ALucendia Herrlich NP 06/28/2016 4:01 PM

## 2016-06-28 NOTE — Assessment & Plan Note (Addendum)
Left breast Ca- stage I ER/PR positive HER-2/neu negative status post mastectomy currently on adjuvant aromatase inhibitor. Clinically no evidence of recurrence. Mammogram May 2017 normal.  # Tolerating aromatase inhibitor well. 5 versus 10 years of AI data previously discussed. Breast cancer index shows low risk of late recurrence, but high likelihood of benefit from extended endocrine therapy. Plan to continue arimidex for total of 10 years (through August 2022).  # Chronic peripheral neuropathy stable. Continue Lyrica as currently taking (73m BID)  # Follow-up with me in 6 months with labs, bone density scan a few days prior.  Repeat mammogram in May 2018.  Awaiting lab results today. OK to get shingles vaccine per PCP.

## 2016-09-01 ENCOUNTER — Ambulatory Visit
Admission: RE | Admit: 2016-09-01 | Discharge: 2016-09-01 | Disposition: A | Discharge status: Home / Self Care | Payer: Medicare Other | Source: Ambulatory Visit | Attending: Internal Medicine | Admitting: Internal Medicine

## 2016-09-01 ENCOUNTER — Other Ambulatory Visit: Payer: Self-pay | Admitting: Internal Medicine

## 2016-09-01 DIAGNOSIS — Z1231 Encounter for screening mammogram for malignant neoplasm of breast: Secondary | ICD-10-CM | POA: Insufficient documentation

## 2016-09-01 DIAGNOSIS — Z853 Personal history of malignant neoplasm of breast: Secondary | ICD-10-CM

## 2016-09-13 ENCOUNTER — Ambulatory Visit (INDEPENDENT_AMBULATORY_CARE_PROVIDER_SITE_OTHER): Payer: Medicare Other | Admitting: Internal Medicine

## 2016-09-13 ENCOUNTER — Encounter: Payer: Self-pay | Admitting: Internal Medicine

## 2016-09-13 DIAGNOSIS — K219 Gastro-esophageal reflux disease without esophagitis: Secondary | ICD-10-CM

## 2016-09-13 DIAGNOSIS — K5790 Diverticulosis of intestine, part unspecified, without perforation or abscess without bleeding: Secondary | ICD-10-CM

## 2016-09-13 DIAGNOSIS — E7800 Pure hypercholesterolemia, unspecified: Secondary | ICD-10-CM

## 2016-09-13 DIAGNOSIS — Z6835 Body mass index (BMI) 35.0-35.9, adult: Secondary | ICD-10-CM | POA: Diagnosis not present

## 2016-09-13 DIAGNOSIS — Z9109 Other allergy status, other than to drugs and biological substances: Secondary | ICD-10-CM

## 2016-09-13 DIAGNOSIS — C50912 Malignant neoplasm of unspecified site of left female breast: Secondary | ICD-10-CM | POA: Diagnosis not present

## 2016-09-13 DIAGNOSIS — E669 Obesity, unspecified: Secondary | ICD-10-CM

## 2016-09-13 DIAGNOSIS — R739 Hyperglycemia, unspecified: Secondary | ICD-10-CM | POA: Diagnosis not present

## 2016-09-13 MED ORDER — FLUTICASONE PROPIONATE 50 MCG/ACT NA SUSP: NASAL | 3 refills | Status: DC

## 2016-09-13 MED ORDER — EPINEPHRINE 0.3 MG/0.3ML IJ SOAJ: 0.3000 mg | Freq: Once | INTRAMUSCULAR | 0 refills | Status: AC

## 2016-09-13 MED ORDER — BUDESONIDE-FORMOTEROL FUMARATE 160-4.5 MCG/ACT IN AERO: 2.0000 | INHALATION_SPRAY | Freq: Two times a day (BID) | RESPIRATORY_TRACT | 1 refills | Status: DC

## 2016-09-13 MED ORDER — OMEPRAZOLE 10 MG PO CPDR: 10.0000 mg | DELAYED_RELEASE_CAPSULE | Freq: Every day | ORAL | 3 refills | Status: DC

## 2016-09-13 NOTE — Progress Notes (Signed)
Patient ID: Robin Hill, female   DOB: [birth year], 66 y.o.   MRN: [id number]   Subjective:    Patient ID: Robin Hill, female    DOB: 11/13/1950, 66 y.o.   MRN: [id number]  HPI  Patient here for a scheduled follow up.   States she is doing relatively well.  Followed by Dr Rogue Bussing for breast cancer.  On armiidex.  Will plan to take for total of 10 years.  Recommended f/u in 6 months.  F/u mammogram 08/2016.  No chest pain.  No sob.  On acid reflux.  No abdominal pain.  Bowels moving.  Overall she feels things are stable.     Past Medical History:  Diagnosis Date  . Allergic rhinitis   . Allergy   . Asthma   . Breast cancer ([place]) 2012   mastectomy with chemo and rad tx  . Diverticulosis   . Dysmenorrhea   . Hypercholesterolemia    Past Surgical History:  Procedure Laterality Date  . BREAST BIOPSY Right 2012   benign  . BREAST SURGERY  2011   breast cancer  . CESAREAN SECTION  1970  . [place]  . CESAREAN SECTION  1987  . COLONOSCOPY WITH PROPOFOL N/A 10/03/2015   Procedure: COLONOSCOPY WITH PROPOFOL;  Surgeon: Manya Silvas, MD;  Location: [facility] ENDOSCOPY;  Service: Endoscopy;  Laterality: N/A;  . MASTECTOMY Left 2012   with chemo and rad tx  . NASAL SINUS SURGERY  07/2001   Family History  Problem Relation Age of Onset  . Cirrhosis Brother        liver  . Breast cancer Paternal Aunt        2 aunts   Social History   Social History  . Marital status: Divorced    Spouse name: N/A  . Number of children: N/A  . Years of education: N/A   Social History Main Topics  . Smoking status: Former Smoker    Types: Cigarettes  . Smokeless tobacco: Never Used  . Alcohol use 0.6 oz/week    1 Glasses of wine per week     Comment: OCC  . Drug use: No  . Sexual activity: No   Other Topics Concern  . None   Social History Narrative  . None    Outpatient Encounter Prescriptions as of 09/13/2016  Medication Sig  . Albuterol Sulfate (PROAIR HFA IN)  90 mcg. 2 puffs using inhaler four times a day as needed.  Marland Kitchen anastrozole (ARIMIDEX) 1 MG tablet TAKE 1 TABLET BY MOUTH  DAILY  . Ascorbic Acid (VITAMIN C PO) Take by mouth.  Marland Kitchen azelastine (ASTELIN) 137 MCG/SPRAY nasal spray 137 mcg. Spray 2 sprays into both nostrils twice a day  . b complex vitamins capsule Take 1 capsule by mouth daily.  . budesonide-formoterol (SYMBICORT) 160-4.5 MCG/ACT inhaler Inhale 2 puffs into the lungs 2 (two) times daily.  . cholecalciferol (VITAMIN D) 400 UNITS TABS Take 400 Units by mouth daily. Chewable  . Cyanocobalamin (VITAMIN B12 PO) Take 500 mcg by mouth daily. Administer 1 tablet by mouth once a day  . fluticasone (FLONASE) 50 MCG/ACT nasal spray Use 1 spray into each nostril twice a day  . levalbuterol (XOPENEX HFA) 45 MCG/ACT inhaler Inhale 2 puffs four times a day as needed  . montelukast (SINGULAIR) 10 MG tablet Take 1 tablet (10 mg total) by mouth at bedtime. 1 tablet once a day  . omeprazole (PRILOSEC) 10 MG capsule Take 1 capsule (10  mg total) by mouth daily.  . pregabalin (LYRICA) 75 MG capsule Take 2 capsules (150 mg total) by mouth 2 (two) times daily.  . rosuvastatin (CRESTOR) 10 MG tablet TAKE 1 TABLET BY MOUTH  DAILY  . sodium chloride (OCEAN) 0.65 % nasal spray Place 1 spray into the nose as needed for congestion.  . vitamin E 400 UNIT capsule Take 400 Units by mouth daily.  . [DISCONTINUED] budesonide-formoterol (SYMBICORT) 160-4.5 MCG/ACT inhaler Inhale 2 puffs into the lungs 2 (two) times daily.  . [DISCONTINUED] budesonide-formoterol (SYMBICORT) 160-4.5 MCG/ACT inhaler Inhale 2 puffs into the lungs 2 (two) times daily.  . [DISCONTINUED] fluticasone (FLONASE) 50 MCG/ACT nasal spray Use 1 spray into each nostril twice a day  . [DISCONTINUED] omeprazole (PRILOSEC) 10 MG capsule Take 1 capsule (10 mg total) by mouth daily.  . [EXPIRED] EPINEPHrine (EPIPEN 2-PAK) 0.3 mg/0.3 mL IJ SOAJ injection Inject 0.3 mLs (0.3 mg total) into the muscle once.    No facility-administered encounter medications on file as of 09/13/2016.     Review of Systems  Constitutional: Negative for appetite change and unexpected weight change.  HENT: Negative for congestion and sinus pressure.   Respiratory: Negative for cough, chest tightness and shortness of breath.   Cardiovascular: Negative for chest pain, palpitations and leg swelling.  Gastrointestinal: Negative for abdominal pain, diarrhea, nausea and vomiting.  Genitourinary: Negative for difficulty urinating and dysuria.  Musculoskeletal: Negative for joint swelling and myalgias.  Skin: Negative for color change and rash.  Neurological: Negative for dizziness, light-headedness and headaches.  Psychiatric/Behavioral: Negative for agitation and dysphoric mood.       Objective:     Blood pressure rechecked by me:  136/78  Physical Exam  Constitutional: She appears well-developed and well-nourished. No distress.  HENT:  Nose: Nose normal.  Mouth/Throat: Oropharynx is clear and moist.  Neck: Neck supple. No thyromegaly present.  Cardiovascular: Normal rate and regular rhythm.   Pulmonary/Chest: Breath sounds normal. No respiratory distress. She has no wheezes.  Abdominal: Soft. Bowel sounds are normal. There is no tenderness.  Musculoskeletal: She exhibits no edema or tenderness.  Lymphadenopathy:    She has no cervical adenopathy.  Skin: No rash noted. No erythema.  Psychiatric: She has a normal mood and affect. Her behavior is normal.    BP 110/65 (BP Location: Left Arm, Patient Position: Sitting, Cuff Size: Normal)   Pulse 84   Temp 98.7 F (37.1 C) (Oral)   Resp 12   Ht _0  (1.626 m)   Wt 221 lb 3.2 oz (100.3 kg)   SpO2 98%   BMI 37.97 kg/m  Wt Readings from Last 3 Encounters:  09/13/16 221 lb 3.2 oz (100.3 kg)  06/28/16 209 lb (94.8 kg)  05/28/16 210 lb 6.4 oz (95.4 kg)     Lab Results  Component Value Date   WBC 7.1 06/28/2016   HGB 12.7 06/28/2016   HCT 37.4  06/28/2016   PLT 183 06/28/2016   GLUCOSE 105 (H) 09/23/2016   CHOL 180 09/23/2016   TRIG 83.0 09/23/2016   HDL 72.80 09/23/2016   LDLDIRECT 126.2 05/09/2013   LDLCALC 91 09/23/2016   ALT 20 09/23/2016   AST 29 09/23/2016   NA 140 09/23/2016   K 4.2 09/23/2016   CL 104 09/23/2016   CREATININE 0.90 09/23/2016   BUN 9 09/23/2016   CO2 32 09/23/2016   TSH 2.76 09/23/2016   HGBA1C 6.1 09/23/2016   MICROALBUR <0.7 09/09/2014  Assessment & Plan:   Problem List Items Addressed This Visit    Breast cancer ([place])    Followed by oncology.  On armidex.  F/u mammogram this month.        Diverticulosis    Stable.  Colonoscopy 10/03/15 - colon polyps x 2 - hyperplastic.  Recommended f/u colonoscopy in 10/2025.        Environmental allergies    Controlled on current regimen.  Follow.        GERD (gastroesophageal reflux disease)    Controlled on current regimen.        Relevant Medications   omeprazole (PRILOSEC) 10 MG capsule   Hypercholesterolemia    On crestor.  Low cholesterol diet and exercise.  Follow lipid panel and liver function tests.        Hyperglycemia    Low carb diet and exercise.  Follow met b and a1c.        Obesity    Diet and exercise.  Follow.            Einar Pheasant, MD

## 2016-09-13 NOTE — Progress Notes (Signed)
Pre-visit discussion using our clinic review tool. No additional management support is needed unless otherwise documented below in the visit note.  

## 2016-09-20 ENCOUNTER — Telehealth: Payer: Self-pay | Admitting: Radiology

## 2016-09-20 ENCOUNTER — Other Ambulatory Visit: Payer: Self-pay | Admitting: Internal Medicine

## 2016-09-20 DIAGNOSIS — R739 Hyperglycemia, unspecified: Secondary | ICD-10-CM

## 2016-09-20 DIAGNOSIS — E7800 Pure hypercholesterolemia, unspecified: Secondary | ICD-10-CM

## 2016-09-20 NOTE — Telephone Encounter (Signed)
Orders placed for labs

## 2016-09-20 NOTE — Telephone Encounter (Signed)
Pt coming in for labs Wednesday, please place future orders. Thank you 

## 2016-09-20 NOTE — Progress Notes (Signed)
Order placed for labs.

## 2016-09-22 ENCOUNTER — Other Ambulatory Visit: Payer: Medicare Other

## 2016-09-23 ENCOUNTER — Other Ambulatory Visit (INDEPENDENT_AMBULATORY_CARE_PROVIDER_SITE_OTHER): Payer: Medicare Other

## 2016-09-23 DIAGNOSIS — R739 Hyperglycemia, unspecified: Secondary | ICD-10-CM

## 2016-09-23 DIAGNOSIS — E7800 Pure hypercholesterolemia, unspecified: Secondary | ICD-10-CM | POA: Diagnosis not present

## 2016-09-23 LAB — LIPID PANEL
CHOLESTEROL: 180 mg/dL (ref 0–200)
HDL: 72.8 mg/dL (ref >39.00)
LDL CALC: 91 mg/dL (ref 0–99)
NonHDL: 107.58
TRIGLYCERIDES: 83 mg/dL (ref 0.0–149.0)
Total CHOL/HDL Ratio: 2
VLDL: 16.6 mg/dL (ref 0.0–40.0)

## 2016-09-23 LAB — BASIC METABOLIC PANEL
BUN: 9 mg/dL (ref 6–23)
CALCIUM: 9.8 mg/dL (ref 8.4–10.5)
CO2: 32 mEq/L (ref 19–32)
Chloride: 104 mEq/L (ref 96–112)
Creatinine, Ser: 0.9 mg/dL (ref 0.40–1.20)
GFR: 80.42 mL/min (ref >60.00)
Glucose, Bld: 105 mg/dL — ABNORMAL HIGH (ref 70–99)
POTASSIUM: 4.2 meq/L (ref 3.5–5.1)
SODIUM: 140 meq/L (ref 135–145)

## 2016-09-23 LAB — HEPATIC FUNCTION PANEL
ALT: 20 U/L (ref 0–35)
AST: 29 U/L (ref 0–37)
Albumin: 4.1 g/dL (ref 3.5–5.2)
Alkaline Phosphatase: 67 U/L (ref 39–117)
BILIRUBIN TOTAL: 0.6 mg/dL (ref 0.2–1.2)
Bilirubin, Direct: 0.1 mg/dL (ref 0.0–0.3)
TOTAL PROTEIN: 6.6 g/dL (ref 6.0–8.3)

## 2016-09-23 LAB — HEMOGLOBIN A1C: HEMOGLOBIN A1C: 6.1 % (ref 4.6–6.5)

## 2016-09-23 LAB — TSH: TSH: 2.76 u[IU]/mL (ref 0.35–4.50)

## 2016-09-25 ENCOUNTER — Encounter: Payer: Self-pay | Admitting: Internal Medicine

## 2016-09-25 NOTE — Assessment & Plan Note (Signed)
Controlled on current regimen.  Follow.  

## 2016-09-25 NOTE — Assessment & Plan Note (Signed)
Followed by oncology.  On armidex.  F/u mammogram this month.

## 2016-09-25 NOTE — Assessment & Plan Note (Signed)
Controlled on current regimen.   

## 2016-09-25 NOTE — Assessment & Plan Note (Signed)
On crestor.  Low cholesterol diet and exercise.  Follow lipid panel and liver function tests.   

## 2016-09-25 NOTE — Assessment & Plan Note (Signed)
Stable.  Colonoscopy 10/03/15 - colon polyps x 2 - hyperplastic.  Recommended f/u colonoscopy in 10/2025.

## 2016-09-25 NOTE — Assessment & Plan Note (Signed)
Diet and exercise.  Follow.  

## 2016-09-25 NOTE — Assessment & Plan Note (Signed)
Low carb diet and exercise.  Follow met b and a1c.   

## 2016-10-06 DIAGNOSIS — H2513 Age-related nuclear cataract, bilateral: Secondary | ICD-10-CM | POA: Diagnosis not present

## 2016-11-09 ENCOUNTER — Other Ambulatory Visit: Payer: Self-pay | Admitting: Internal Medicine

## 2016-11-26 ENCOUNTER — Telehealth: Payer: Self-pay | Admitting: *Deleted

## 2016-11-26 ENCOUNTER — Other Ambulatory Visit: Payer: Self-pay | Admitting: *Deleted

## 2016-11-26 DIAGNOSIS — G629 Polyneuropathy, unspecified: Secondary | ICD-10-CM

## 2016-11-26 MED ORDER — PREGABALIN 75 MG PO CAPS: 150.0000 mg | ORAL_CAPSULE | Freq: Two times a day (BID) | ORAL | 4 refills | Status: DC

## 2016-11-26 NOTE — Telephone Encounter (Signed)
Patient called asking that Dr. Sharmaine Base nurse call her.  She is needing medication called in.

## 2016-11-26 NOTE — Telephone Encounter (Signed)
lyrica rx [facility]  [phone]

## 2016-11-29 NOTE — Telephone Encounter (Signed)
rx faxed to patient's pharmacy per request

## 2016-12-14 ENCOUNTER — Ambulatory Visit: Payer: Medicare Other | Admitting: Internal Medicine

## 2016-12-20 ENCOUNTER — Ambulatory Visit
Admission: RE | Admit: 2016-12-20 | Discharge: 2016-12-20 | Disposition: A | Discharge status: Home / Self Care | Payer: Medicare Other | Source: Ambulatory Visit | Attending: Oncology | Admitting: Oncology

## 2016-12-20 ENCOUNTER — Other Ambulatory Visit: Payer: Self-pay | Admitting: Internal Medicine

## 2016-12-20 DIAGNOSIS — C50812 Malignant neoplasm of overlapping sites of left female breast: Secondary | ICD-10-CM

## 2016-12-20 DIAGNOSIS — Z79811 Long term (current) use of aromatase inhibitors: Secondary | ICD-10-CM | POA: Insufficient documentation

## 2016-12-20 DIAGNOSIS — Z170 Estrogen receptor positive status [ER+]: Secondary | ICD-10-CM | POA: Insufficient documentation

## 2016-12-20 DIAGNOSIS — Z780 Asymptomatic menopausal state: Secondary | ICD-10-CM | POA: Diagnosis not present

## 2016-12-21 ENCOUNTER — Other Ambulatory Visit: Payer: Self-pay | Admitting: *Deleted

## 2016-12-24 ENCOUNTER — Other Ambulatory Visit: Payer: Self-pay | Admitting: Internal Medicine

## 2016-12-24 DIAGNOSIS — C50812 Malignant neoplasm of overlapping sites of left female breast: Secondary | ICD-10-CM

## 2016-12-24 DIAGNOSIS — Z170 Estrogen receptor positive status [ER+]: Principal | ICD-10-CM

## 2016-12-27 ENCOUNTER — Inpatient Hospital Stay: Payer: Medicare Other | Attending: Internal Medicine

## 2016-12-27 ENCOUNTER — Inpatient Hospital Stay (HOSPITAL_BASED_OUTPATIENT_CLINIC_OR_DEPARTMENT_OTHER): Payer: Medicare Other | Admitting: Internal Medicine

## 2016-12-27 VITALS — BP 125/79 | HR 81 | Temp 97.4°F | Resp 16 | Wt 208.6 lb

## 2016-12-27 DIAGNOSIS — E7800 Pure hypercholesterolemia, unspecified: Secondary | ICD-10-CM

## 2016-12-27 DIAGNOSIS — Z170 Estrogen receptor positive status [ER+]: Secondary | ICD-10-CM

## 2016-12-27 DIAGNOSIS — Z923 Personal history of irradiation: Secondary | ICD-10-CM | POA: Diagnosis not present

## 2016-12-27 DIAGNOSIS — Z86000 Personal history of in-situ neoplasm of breast: Secondary | ICD-10-CM | POA: Insufficient documentation

## 2016-12-27 DIAGNOSIS — Z79811 Long term (current) use of aromatase inhibitors: Secondary | ICD-10-CM

## 2016-12-27 DIAGNOSIS — C50812 Malignant neoplasm of overlapping sites of left female breast: Secondary | ICD-10-CM

## 2016-12-27 DIAGNOSIS — K5790 Diverticulosis of intestine, part unspecified, without perforation or abscess without bleeding: Secondary | ICD-10-CM | POA: Diagnosis not present

## 2016-12-27 DIAGNOSIS — Z9221 Personal history of antineoplastic chemotherapy: Secondary | ICD-10-CM | POA: Insufficient documentation

## 2016-12-27 DIAGNOSIS — G629 Polyneuropathy, unspecified: Secondary | ICD-10-CM | POA: Insufficient documentation

## 2016-12-27 DIAGNOSIS — Z87891 Personal history of nicotine dependence: Secondary | ICD-10-CM | POA: Insufficient documentation

## 2016-12-27 DIAGNOSIS — Z79899 Other long term (current) drug therapy: Secondary | ICD-10-CM

## 2016-12-27 DIAGNOSIS — Z803 Family history of malignant neoplasm of breast: Secondary | ICD-10-CM

## 2016-12-27 DIAGNOSIS — Z9012 Acquired absence of left breast and nipple: Secondary | ICD-10-CM | POA: Diagnosis not present

## 2016-12-27 LAB — COMPREHENSIVE METABOLIC PANEL
ALBUMIN: 4.1 g/dL (ref 3.5–5.0)
ALT: 24 U/L (ref 14–54)
ANION GAP: 5 (ref 5–15)
AST: 30 U/L (ref 15–41)
Alkaline Phosphatase: 65 U/L (ref 38–126)
BUN: 12 mg/dL (ref 6–20)
CHLORIDE: 102 mmol/L (ref 101–111)
CO2: 30 mmol/L (ref 22–32)
Calcium: 9.4 mg/dL (ref 8.9–10.3)
Creatinine, Ser: 0.97 mg/dL (ref 0.44–1.00)
GFR calc Af Amer: >60 mL/min (ref >60)
GFR calc non Af Amer: 60 mL/min — ABNORMAL LOW (ref >60)
GLUCOSE: 108 mg/dL — AB (ref 65–99)
POTASSIUM: 3.9 mmol/L (ref 3.5–5.1)
Sodium: 137 mmol/L (ref 135–145)
TOTAL PROTEIN: 7.1 g/dL (ref 6.5–8.1)
Total Bilirubin: 0.6 mg/dL (ref 0.3–1.2)

## 2016-12-27 LAB — CBC WITH DIFFERENTIAL/PLATELET
BASOS ABS: 0.1 10*3/uL (ref 0–0.1)
BASOS PCT: 1 %
Eosinophils Absolute: 0.3 10*3/uL (ref 0–0.7)
Eosinophils Relative: 5 %
HCT: 35.8 % (ref 35.0–47.0)
Hemoglobin: 12.5 g/dL (ref 12.0–16.0)
Lymphocytes Relative: 28 %
Lymphs Abs: 1.9 10*3/uL (ref 1.0–3.6)
MCH: 31 pg (ref 26.0–34.0)
MCHC: 34.8 g/dL (ref 32.0–36.0)
MCV: 89.1 fL (ref 80.0–100.0)
MONO ABS: 0.6 10*3/uL (ref 0.2–0.9)
MONOS PCT: 8 %
Neutro Abs: 4.1 10*3/uL (ref 1.4–6.5)
Neutrophils Relative %: 58 %
PLATELETS: 170 10*3/uL (ref 150–440)
RBC: 4.02 MIL/uL (ref 3.80–5.20)
RDW: 13.2 % (ref 11.5–14.5)
WBC: 7 10*3/uL (ref 3.6–11.0)

## 2016-12-27 NOTE — Progress Notes (Signed)
Patient is here today for a follow up. Patient states no new concerns today.  

## 2016-12-27 NOTE — Assessment & Plan Note (Addendum)
Left breast Ca- stage I ER/PR positive HER-2/neu negative status post mastectomy currently on adjuvant aromatase inhibitor. Clinically no evidence of recurrence. Mammogram May 2018 normal.  # Tolerating aromatase inhibitor well. 5 versus 10 years of AI data previously discussed. Breast cancer index shows low risk of late recurrence, but high likelihood of benefit from extended endocrine therapy. Plan to continue arimidex for total of 10 years (through August 2022).  # BMD- may 2018- Normal; cont ca= Vir=t D   # Chronic peripheral neuropathy stable. Continue Lyrica as currently taking ('75mg'$  BID)  # follow up in 12 months/ labs; mammogram in may 2019. Marland KitchenMarland Kitchen

## 2016-12-27 NOTE — Progress Notes (Signed)
[facility] OFFICE PROGRESS NOTE  Patient Care Team: Einar Pheasant, MD as PCP - General (Internal Medicine)  Cancer Staging No matching staging information was found for the patient.   Oncology History   # 2011- LEFT BREAST [Dr.Smith; Dr.Pandit] Stage I IDC with lobular features pT1c (1.9cm) pSNmi-s/p Lumpec & RT; AC- Taxol x10 [sec to PN]; ER/PR-Pos; Her 2 NEU. [facility]- Aug 2017- Low risk of recurrence 1.3%; however- benefit from extended AI [node pos pt]  # 2012- RIGHT BREAST DCIS s/p mastectomy- on AI'  # MAY 2018- BMD- Normal.   # PN- G2- on Lyrica     Carcinoma of overlapping sites of left breast in female, estrogen receptor positive ([place])     INTERVAL HISTORY:  Robin Hill 66 y.o.  female pleasant patient above history of early stage breast cancer currently on aromatase inhibitor is here for follow-up.  Denies any hot flashes. Denies any bone pain. No nausea no vomiting. No chest pain or shortness of breath cough. Chronic mild tingling and numbness of her extremities from neuropathy. Not any worse.  REVIEW OF SYSTEMS:  A complete 10 point review of system is done which is negative except mentioned above/history of present illness.   PAST MEDICAL HISTORY :  Past Medical History:  Diagnosis Date  . Allergic rhinitis   . Allergy   . Asthma   . Breast cancer ([place]) 2012   mastectomy with chemo and rad tx  . Diverticulosis   . Dysmenorrhea   . Hypercholesterolemia     PAST SURGICAL HISTORY :   Past Surgical History:  Procedure Laterality Date  . BREAST BIOPSY Right 2012   benign  . BREAST SURGERY  2011   breast cancer  . CESAREAN SECTION  1970  . [place]  . CESAREAN SECTION  1987  . COLONOSCOPY WITH PROPOFOL N/A 10/03/2015   Procedure: COLONOSCOPY WITH PROPOFOL;  Surgeon: Manya Silvas, MD;  Location: [facility] ENDOSCOPY;  Service: Endoscopy;  Laterality: N/A;  . MASTECTOMY Left 2012   with chemo and rad tx  . NASAL SINUS SURGERY   07/2001    FAMILY HISTORY :   Family History  Problem Relation Age of Onset  . Cirrhosis Brother        liver  . Breast cancer Paternal Aunt        2 aunts    SOCIAL HISTORY:   Social History  Substance Use Topics  . Smoking status: Former Smoker    Types: Cigarettes  . Smokeless tobacco: Never Used  . Alcohol use 0.6 oz/week    1 Glasses of wine per week     Comment: OCC    ALLERGIES:  has No Known Allergies.  MEDICATIONS:  Current Outpatient Prescriptions  Medication Sig Dispense Refill  . Albuterol Sulfate (PROAIR HFA IN) 90 mcg. 2 puffs using inhaler four times a day as needed.    Marland Kitchen anastrozole (ARIMIDEX) 1 MG tablet TAKE 1 TABLET BY MOUTH  DAILY 90 tablet 0  . Ascorbic Acid (VITAMIN C PO) Take by mouth.    Marland Kitchen azelastine (ASTELIN) 137 MCG/SPRAY nasal spray 137 mcg. Spray 2 sprays into both nostrils twice a day    . b complex vitamins capsule Take 1 capsule by mouth daily.    . budesonide-formoterol (SYMBICORT) 160-4.5 MCG/ACT inhaler Inhale 2 puffs into the lungs 2 (two) times daily. 3 Inhaler 1  . cholecalciferol (VITAMIN D) 400 UNITS TABS Take 400 Units by mouth daily. Chewable    .  Cyanocobalamin (VITAMIN B12 PO) Take 500 mcg by mouth daily. Administer 1 tablet by mouth once a day    . fluticasone (FLONASE) 50 MCG/ACT nasal spray Use 1 spray into each nostril twice a day 48 g 3  . levalbuterol (XOPENEX HFA) 45 MCG/ACT inhaler Inhale 2 puffs four times a day as needed    . montelukast (SINGULAIR) 10 MG tablet Take 1 tablet (10 mg total) by mouth at bedtime. 1 tablet once a day 90 tablet 3  . omeprazole (PRILOSEC) 10 MG capsule Take 1 capsule (10 mg total) by mouth daily. 90 capsule 3  . pregabalin (LYRICA) 75 MG capsule Take 2 capsules (150 mg total) by mouth 2 (two) times daily. 360 capsule 4  . rosuvastatin (CRESTOR) 10 MG tablet TAKE 1 TABLET BY MOUTH  DAILY 90 tablet 0  . sodium chloride (OCEAN) 0.65 % nasal spray Place 1 spray into the nose as needed for  congestion.    . vitamin E 400 UNIT capsule Take 400 Units by mouth daily.     No current facility-administered medications for this visit.     PHYSICAL EXAMINATION: ECOG PERFORMANCE STATUS: 0 - Asymptomatic  BP 125/79 (BP Location: Left Arm, Patient Position: Sitting)   Pulse 81   Temp (!) 97.4 F (36.3 C) (Tympanic)   Resp 16   Wt 208 lb 9.6 oz (94.6 kg)   BMI 35.81 kg/m   Filed Weights   12/27/16 1533  Weight: 208 lb 9.6 oz (94.6 kg)    GENERAL: Well-nourished well-developed; Alert, no distress and comfortable.   AAccompanied by her granddaughter. EYES: no pallor or icterus OROPHARYNX: no thrush or ulceration; good dentition  NECK: supple, no masses felt LYMPH:  no palpable lymphadenopathy in the cervical, axillary or inguinal regions LUNGS: clear to auscultation and  No wheeze or crackles HEART/CVS: regular rate & rhythm and no murmurs; No lower extremity edema ABDOMEN:abdomen soft, non-tender and normal bowel sounds Musculoskeletal:no cyanosis of digits and no clubbing  PSYCH: alert & oriented x 3 with fluent speech NEURO: no focal motor/sensory deficits SKIN:  no rashes or significant lesions  Right and left BREAST exam [in the presence of nurse]- no unusual skin changes or dominant masses felt. Left mastectomy noted. No lumps or bumps.     Component Value Date/Time   NA 137 12/27/2016 1510   NA 142 11/03/2011   K 3.9 12/27/2016 1510   CL 102 12/27/2016 1510   CO2 30 12/27/2016 1510   GLUCOSE 108 (H) 12/27/2016 1510   BUN 12 12/27/2016 1510   BUN 14 11/03/2011   CREATININE 0.97 12/27/2016 1510   CREATININE 1.04 12/18/2012 1406   CALCIUM 9.4 12/27/2016 1510   PROT 7.1 12/27/2016 1510   PROT 7.0 12/18/2012 1406   ALBUMIN 4.1 12/27/2016 1510   ALBUMIN 3.7 12/18/2012 1406   AST 30 12/27/2016 1510   AST 23 12/18/2012 1406   ALT 24 12/27/2016 1510   ALT 32 12/18/2012 1406   ALKPHOS 65 12/27/2016 1510   ALKPHOS 91 12/18/2012 1406   BILITOT 0.6 12/27/2016  1510   BILITOT 0.6 12/18/2012 1406   GFRNONAA 60 (L) 12/27/2016 1510   GFRNONAA 58 (L) 12/18/2012 1406   GFRAA >60 12/27/2016 1510   GFRAA >60 12/18/2012 1406    No results found for: SPEP, UPEP  Lab Results  Component Value Date   WBC 7.0 12/27/2016   NEUTROABS 4.1 12/27/2016   HGB 12.5 12/27/2016   HCT 35.8 12/27/2016   MCV 89.1 12/27/2016  PLT 170 12/27/2016      Chemistry      Component Value Date/Time   NA 137 12/27/2016 1510   NA 142 11/03/2011   K 3.9 12/27/2016 1510   CL 102 12/27/2016 1510   CO2 30 12/27/2016 1510   BUN 12 12/27/2016 1510   BUN 14 11/03/2011   CREATININE 0.97 12/27/2016 1510   CREATININE 1.04 12/18/2012 1406   GLU 95 11/03/2011      Component Value Date/Time   CALCIUM 9.4 12/27/2016 1510   ALKPHOS 65 12/27/2016 1510   ALKPHOS 91 12/18/2012 1406   AST 30 12/27/2016 1510   AST 23 12/18/2012 1406   ALT 24 12/27/2016 1510   ALT 32 12/18/2012 1406   BILITOT 0.6 12/27/2016 1510   BILITOT 0.6 12/18/2012 1406       RADIOGRAPHIC STUDIES: I have personally reviewed the radiological images as listed and agreed with the findings in the report. No results found.   ASSESSMENT & PLAN:  Carcinoma of overlapping sites of left breast in female, estrogen receptor positive ([place]) Left breast Ca- stage I ER/PR positive HER-2/neu negative status post mastectomy currently on adjuvant aromatase inhibitor. Clinically no evidence of recurrence. Mammogram May 2018 normal.  # Tolerating aromatase inhibitor well. 5 versus 10 years of AI data previously discussed. Breast cancer index shows low risk of late recurrence, but high likelihood of benefit from extended endocrine therapy. Plan to continue arimidex for total of 10 years (through August 2022).  # BMD- may 2018- Normal; cont ca= Vir=t D   # Chronic peripheral neuropathy stable. Continue Lyrica as currently taking (56m BID)  # follow up in 12 months/ labs; mammogram in may 2019. ..   Orders Placed  This Encounter  Procedures  . MM Digital Screening Unilat R    Standing Status:   Future    Standing Expiration Date:   12/27/2017    Order Specific Question:   Reason for Exam (SYMPTOM  OR DIAGNOSIS REQUIRED)    Answer:   Breast cancer    Order Specific Question:   [facility]    [facility]  All questions were answered. The patient knows to call the clinic with any problems, questions or concerns.      GCammie Sickle MD 12/28/2016 8:05 AM

## 2017-02-07 ENCOUNTER — Other Ambulatory Visit: Payer: Self-pay | Admitting: Internal Medicine

## 2017-03-07 ENCOUNTER — Other Ambulatory Visit: Payer: Self-pay | Admitting: Internal Medicine

## 2017-03-08 ENCOUNTER — Other Ambulatory Visit: Payer: Self-pay

## 2017-03-08 MED ORDER — ROSUVASTATIN CALCIUM 10 MG PO TABS: 10.0000 mg | ORAL_TABLET | Freq: Every day | ORAL | 0 refills | Status: DC

## 2017-05-31 ENCOUNTER — Ambulatory Visit: Payer: Medicare Other

## 2017-06-01 ENCOUNTER — Other Ambulatory Visit: Payer: Self-pay | Admitting: *Deleted

## 2017-06-01 MED ORDER — ANASTROZOLE 1 MG PO TABS: 1.0000 mg | ORAL_TABLET | Freq: Every day | ORAL | 3 refills | Status: DC

## 2017-06-01 NOTE — Telephone Encounter (Signed)
Okay to refill meds? Last seen on 09/13/16 & NOV: 08/11/17.  Azelastine is listed as a historical med.  Omeprazole was last written on 09/13/16 #90 with 3 refills  Rosuvastatin was last written on 03/08/17 for #90 with no refills  (Pt switched pharmacy from [facility] to [facility])

## 2017-06-02 ENCOUNTER — Ambulatory Visit (INDEPENDENT_AMBULATORY_CARE_PROVIDER_SITE_OTHER): Payer: Medicare HMO

## 2017-06-02 VITALS — BP 110/70 | HR 85 | Temp 98.0°F | Resp 14 | Ht 64.0 in

## 2017-06-02 DIAGNOSIS — Z0000 Encounter for general adult medical examination without abnormal findings: Secondary | ICD-10-CM | POA: Diagnosis not present

## 2017-06-02 DIAGNOSIS — Z1159 Encounter for screening for other viral diseases: Secondary | ICD-10-CM | POA: Diagnosis not present

## 2017-06-02 MED ORDER — OMEPRAZOLE 10 MG PO CPDR: 10.0000 mg | DELAYED_RELEASE_CAPSULE | Freq: Every day | ORAL | 0 refills | Status: DC

## 2017-06-02 MED ORDER — ROSUVASTATIN CALCIUM 10 MG PO TABS: 10.0000 mg | ORAL_TABLET | Freq: Every day | ORAL | 0 refills | Status: DC

## 2017-06-02 MED ORDER — AZELASTINE HCL 0.1 % NA SOLN: 2.0000 | Freq: Two times a day (BID) | NASAL | 1 refills | Status: DC

## 2017-06-02 NOTE — Telephone Encounter (Signed)
rx ok'd for omeprazole, astelin and generic crestor.  Please inform pt that I sent rx to [facility].  Keep scheduled appt.

## 2017-06-02 NOTE — Telephone Encounter (Signed)
Patient aware.

## 2017-06-02 NOTE — Progress Notes (Signed)
Subjective:   Robin Hill is a 67 y.o. female who presents for Medicare Annual (Subsequent) preventive examination.  Review of Systems:  No ROS.  Medicare Wellness Visit. Additional risk factors are reflected in the social history. Cardiac Risk Factors include: advanced age (>40men, >39 women)     Objective:     Vitals: BP 110/70 (BP Location: Right Arm, Patient Position: Sitting, Cuff Size: Normal)   Pulse 85   Temp 98 F (36.7 C) (Oral)   Resp 14   Ht 5\' 4"  (1.626 m)   SpO2 98%   BMI 35.81 kg/m   Body mass index is 35.81 kg/m.  Advanced Directives 06/02/2017 12/27/2016 06/28/2016 05/28/2016 12/26/2015 10/03/2015  Does Patient Have a Medical Advance Directive? Yes Yes Yes Yes Yes Yes  Type of Paramedic of [place];Living will [place];Living will Healthcare Power of [place];Living will Healthcare Power of [place];Living will  Does patient want to make changes to medical advance directive? No - Patient declined - - No - Patient declined No - Patient declined -  Copy of [place] in Chart? Yes - Yes Yes No - copy requested No - copy requested  Would patient like information on creating a medical advance directive? - - - - No - patient declined information -    Tobacco Social History   Tobacco Use  Smoking Status Former Smoker  . Types: Cigarettes  Smokeless Tobacco Never Used     Counseling given: Not Answered   Clinical Intake:  Pre-visit preparation completed: Yes  Pain : No/denies pain     Nutritional Status: BMI > 30  Obese Diabetes: No  How often do you need to have someone help you when you read instructions, pamphlets, or other written materials from your doctor or pharmacy?: 1 - Never  Interpreter Needed?: No     Past Medical History:  Diagnosis Date  . Allergic rhinitis   . Allergy   . Asthma   . Breast cancer ([place]) 2012     mastectomy with chemo and rad tx  . Diverticulosis   . Dysmenorrhea   . Hypercholesterolemia    Past Surgical History:  Procedure Laterality Date  . BREAST BIOPSY Right 2012   benign  . BREAST SURGERY  2011   breast cancer  . CESAREAN SECTION  1970  . [place]  . CESAREAN SECTION  1987  . COLONOSCOPY WITH PROPOFOL N/A 10/03/2015   Procedure: COLONOSCOPY WITH PROPOFOL;  Surgeon: Manya Silvas, MD;  Location: [facility] ENDOSCOPY;  Service: Endoscopy;  Laterality: N/A;  . MASTECTOMY Left 2012   with chemo and rad tx  . NASAL SINUS SURGERY  07/2001   Family History  Problem Relation Age of Onset  . Cirrhosis Brother        liver  . Breast cancer Paternal Aunt        2 aunts   Social History   Socioeconomic History  . Marital status: Divorced    Spouse name: None  . Number of children: None  . Years of education: None  . Highest education level: None  Social Needs  . Financial resource strain: None  . Food insecurity - worry: None  . Food insecurity - inability: None  . Transportation needs - medical: None  . Transportation needs - non-medical: None  Occupational History  . None  Tobacco Use  . Smoking status: Former Smoker  Types: Cigarettes  . Smokeless tobacco: Never Used  Substance and Sexual Activity  . Alcohol use: Yes    Alcohol/week: 0.6 oz    Types: 1 Glasses of wine per week    Comment: OCC  . Drug use: No  . Sexual activity: No  Other Topics Concern  . None  Social History Narrative  . None    Outpatient Encounter Medications as of 06/02/2017  Medication Sig  . Albuterol Sulfate (PROAIR HFA IN) 90 mcg. 2 puffs using inhaler four times a day as needed.  Marland Kitchen anastrozole (ARIMIDEX) 1 MG tablet Take 1 tablet (1 mg total) by mouth daily.  . Ascorbic Acid (VITAMIN C PO) Take by mouth.  Marland Kitchen azelastine (ASTELIN) 0.1 % nasal spray Place 2 sprays into both nostrils 2 (two) times daily. 137 mcg. Spray 2 sprays into both nostrils twice a day  . b  complex vitamins capsule Take 1 capsule by mouth daily.  . cholecalciferol (VITAMIN D) 400 UNITS TABS Take 400 Units by mouth daily. Chewable  . Cyanocobalamin (VITAMIN B12 PO) Take 500 mcg by mouth daily. Administer 1 tablet by mouth once a day  . fluticasone (FLONASE) 50 MCG/ACT nasal spray Use 1 spray into each nostril twice a day  . levalbuterol (XOPENEX HFA) 45 MCG/ACT inhaler Inhale 2 puffs four times a day as needed  . montelukast (SINGULAIR) 10 MG tablet Take 1 tablet (10 mg total) by mouth at bedtime. 1 tablet once a day  . omeprazole (PRILOSEC) 10 MG capsule Take 1 capsule (10 mg total) by mouth daily.  . pregabalin (LYRICA) 75 MG capsule Take 2 capsules (150 mg total) by mouth 2 (two) times daily.  . rosuvastatin (CRESTOR) 10 MG tablet Take 1 tablet (10 mg total) by mouth daily.  . sodium chloride (OCEAN) 0.65 % nasal spray Place 1 spray into the nose as needed for congestion.  . SYMBICORT 160-4.5 MCG/ACT inhaler INHALE 2 PUFFS INTO THE  LUNGS 2 (TWO)TIMES DAILY  . vitamin E 400 UNIT capsule Take 400 Units by mouth daily.   No facility-administered encounter medications on file as of 06/02/2017.     Activities of Daily Living In your present state of health, do you have any difficulty performing the following activities: 06/02/2017  Hearing? N  Vision? N  Difficulty concentrating or making decisions? N  Walking or climbing stairs? N  Dressing or bathing? N  Doing errands, shopping? N  Preparing Food and eating ? N  Using the Toilet? N  In the past six months, have you accidently leaked urine? N  Do you have problems with loss of bowel control? N  Managing your Medications? N  Managing your Finances? N  Housekeeping or managing your Housekeeping? N  Some recent data might be hidden    Patient Care Team: Einar Pheasant, MD as PCP - General (Internal Medicine)    Assessment:   This is a routine wellness examination for Robin Hill. The goal of the wellness visit is to assist  the patient how to close the gaps in care and create a preventative care plan for the patient.   The roster of all physicians providing medical care to patient is listed in the Snapshot section of the chart.  Taking calcium VIT D as appropriate/Osteoporosis risk reviewed.    Safety issues reviewed; Smoke and carbon monoxide detectors in the home. No firearms in the home.  Wears seatbelts when driving or riding with others. Patient does wear sunscreen or protective clothing when in direct  sunlight. No violence in the home.  Depression- PHQ 2 &9 complete.  No signs/symptoms or verbal communication regarding little pleasure in doing things, feeling down, depressed or hopeless. No changes in sleeping, energy, eating, concentrating.  No thoughts of self harm or harm towards others.  Time spent on this topic is 8 minutes.   Patient is alert, normal appearance, oriented to person/place/and time. Correctly identified the president of the [place], recall of 3/3 words, and performing simple calculations. Displays appropriate judgement and can read correct time from watch face.   No new identified risk were noted.  No failures at ADL's or IADL's.    BMI- discussed the importance of a healthy diet, water intake and the benefits of aerobic exercise. Educational material provided.   24 hour diet recall: Low carb  Breakfast: omelete, cheese, [place] sausage, spinach  Lunch: salad, smoothie protein drink Dinner: pork loin, vegetable  Daily fluid intake: 1cups of caffeine, 10-12 cups of water.  Dental- every 6 months.  Eye- Visual acuity not assessed per patient preference since they have regular follow up with the ophthalmologist.  Wears corrective lenses.  Sleep patterns- Sleeps 6-8 hours at night.  Wakes feeling rested.  Hepatitis C screening completed.   Health maintenance gaps- closed.  Patient Concerns: None at this time. Follow up with PCP as needed.  Exercise Activities and Dietary  recommendations Current Exercise Habits: Structured exercise class, Time (Minutes): > 60, Frequency (Times/Week): 4, Weekly Exercise (Minutes/Week): 0, Intensity: Moderate  Goals    .  REDUCE SUGAR INTAKE     Portion control       Fall Risk Fall Risk  06/02/2017 05/28/2016 02/17/2016 09/04/2015 08/28/2014  Falls in the past year? No No No No No   Depression Screen PHQ 2/9 Scores 06/02/2017 05/28/2016 02/17/2016 09/04/2015  PHQ - 2 Score 0 0 0 0     Cognitive Function MMSE - Mini Mental State Exam 06/02/2017  Orientation to time 5  Orientation to Place 5  Registration 3  Attention/ Calculation 5  Recall 3  Language- name 2 objects 2  Language- repeat 1  Language- follow 3 step command 3  Language- read & follow direction 1  Write a sentence 1  Copy design 1  Total score 30     6CIT Screen 05/28/2016  What Year? 0 points  What month? 0 points  What time? 0 points  Count back from 20 0 points  Months in reverse 0 points  Repeat phrase 0 points  Total Score 0    Immunization History  Administered Date(s) Administered  . Influenza Split 02/22/2014  . Influenza Whole 02/17/2017  . Influenza-Unspecified 02/09/2013, 02/24/2015, 12/09/2015  . Pneumococcal Conjugate-13 06/05/2016  . Pneumococcal Polysaccharide-23 02/24/2015  . Tdap 12/09/2015  . Zoster Recombinat (Shingrix) 08/13/2016   Screening Tests Health Maintenance  Topic Date Due  . MAMMOGRAM  09/01/2017  . PNA vac Low Risk Adult (2 of 2 - PPSV23) 02/24/2020  . COLONOSCOPY  10/02/2025  . TETANUS/TDAP  12/08/2025  . INFLUENZA VACCINE  Completed  . DEXA SCAN  Completed  . Hepatitis C Screening  Completed      Plan:    End of life planning; Advance aging; Advanced directives discussed. Copy of current HCPOA/Living Will on file.    I have personally reviewed and noted the following in the patient's chart:   . Medical and social history . Use of alcohol, tobacco or illicit drugs  . Current medications and  supplements . Functional ability and status .  Nutritional status . Physical activity . Advanced directives . List of other physicians . Hospitalizations, surgeries, and ER visits in previous 12 months . Vitals . Screenings to include cognitive, depression, and falls . Referrals and appointments  In addition, I have reviewed and discussed with patient certain preventive protocols, quality metrics, and best practice recommendations. A written personalized care plan for preventive services as well as general preventive health recommendations were provided to patient.     Varney Biles, LPN  0/08/1100   Reviewed above information.  Agree with assessment and plan.   Dr Nicki Reaper

## 2017-06-02 NOTE — Patient Instructions (Addendum)
  Ms. Philipp , Thank you for taking time to come for your Medicare Wellness Visit. I appreciate your ongoing commitment to your health goals. Please review the following plan we discussed and let me know if I can assist you in the future.   These are the goals we discussed: Goals    .  REDUCE SUGAR INTAKE     Portion control       This is a list of the screening recommended for you and due dates:  Health Maintenance  Topic Date Due  . Mammogram  09/01/2017  . Pneumonia vaccines (2 of 2 - PPSV23) 02/24/2020  . Colon Cancer Screening  10/02/2025  . Tetanus Vaccine  12/08/2025  . Flu Shot  Completed  . DEXA scan (bone density measurement)  Completed  .  Hepatitis C: One time screening is recommended by Center for Disease Control  (CDC) for  adults born from [birth year] through 1966.   Completed    Follow up with Dr. Nicki Reaper as needed.    Have a great day!

## 2017-06-03 LAB — HEPATITIS C ANTIBODY
Hepatitis C Ab: NONREACTIVE
SIGNAL TO CUT-OFF: 0.03 (ref <1.00)

## 2017-06-05 ENCOUNTER — Encounter: Payer: Self-pay | Admitting: Internal Medicine

## 2017-06-09 ENCOUNTER — Other Ambulatory Visit: Payer: Self-pay

## 2017-06-09 MED ORDER — MONTELUKAST SODIUM 10 MG PO TABS: 10.0000 mg | ORAL_TABLET | Freq: Every day | ORAL | 3 refills | Status: DC

## 2017-06-21 ENCOUNTER — Ambulatory Visit (INDEPENDENT_AMBULATORY_CARE_PROVIDER_SITE_OTHER): Payer: Medicare HMO | Admitting: Family

## 2017-06-21 ENCOUNTER — Encounter: Payer: Self-pay | Admitting: Family

## 2017-06-21 VITALS — BP 138/82 | HR 85 | Temp 98.5°F | Resp 15 | Wt 194.1 lb

## 2017-06-21 DIAGNOSIS — J209 Acute bronchitis, unspecified: Secondary | ICD-10-CM

## 2017-06-21 MED ORDER — AZITHROMYCIN 250 MG PO TABS: ORAL_TABLET | ORAL | 0 refills | Status: DC

## 2017-06-21 NOTE — Progress Notes (Signed)
Subjective:    Patient ID: Robin Hill, female    DOB: 09/04/1950, 67 y.o.   MRN: [id number]  CC: Robin Hill is a 67 y.o. female who presents today for an acute visit.    HPI: CC: productive cough x 5 days, unchanged. Cough worse at night.  No fever, chills. Started as sore throat, since resolved Has been using mucinex, vicks vaporm nyquil with some relief. Has used rescue inhaler once, with relief. Using symbicort daily.   Former smoker as teenager.   No recent hospiatlizatons, pna. No recent travel.        HISTORY:  Past Medical History:  Diagnosis Date  . Allergic rhinitis   . Allergy   . Asthma   . Breast cancer ([place]) 2012   mastectomy with chemo and rad tx  . Diverticulosis   . Dysmenorrhea   . Hypercholesterolemia    Past Surgical History:  Procedure Laterality Date  . BREAST BIOPSY Right 2012   benign  . BREAST SURGERY  2011   breast cancer  . CESAREAN SECTION  1970  . [place]  . CESAREAN SECTION  1987  . COLONOSCOPY WITH PROPOFOL N/A 10/03/2015   Procedure: COLONOSCOPY WITH PROPOFOL;  Surgeon: Manya Silvas, MD;  Location: [facility] ENDOSCOPY;  Service: Endoscopy;  Laterality: N/A;  . MASTECTOMY Left 2012   with chemo and rad tx  . NASAL SINUS SURGERY  07/2001   Family History  Problem Relation Age of Onset  . Cirrhosis Brother        liver  . Breast cancer Paternal Aunt        2 aunts    Allergies: Patient has no known allergies. Current Outpatient Medications on File Prior to Visit  Medication Sig Dispense Refill  . Albuterol Sulfate (PROAIR HFA IN) 90 mcg. 2 puffs using inhaler four times a day as needed.    Marland Kitchen anastrozole (ARIMIDEX) 1 MG tablet Take 1 tablet (1 mg total) by mouth daily. 90 tablet 3  . Ascorbic Acid (VITAMIN C PO) Take by mouth.    Marland Kitchen azelastine (ASTELIN) 0.1 % nasal spray Place 2 sprays into both nostrils 2 (two) times daily. 137 mcg. Spray 2 sprays into both nostrils twice a day 90 mL 1  . b complex  vitamins capsule Take 1 capsule by mouth daily.    . cholecalciferol (VITAMIN D) 400 UNITS TABS Take 400 Units by mouth daily. Chewable    . Cyanocobalamin (VITAMIN B12 PO) Take 500 mcg by mouth daily. Administer 1 tablet by mouth once a day    . fluticasone (FLONASE) 50 MCG/ACT nasal spray Use 1 spray into each nostril twice a day 48 g 3  . levalbuterol (XOPENEX HFA) 45 MCG/ACT inhaler Inhale 2 puffs four times a day as needed    . montelukast (SINGULAIR) 10 MG tablet Take 1 tablet (10 mg total) by mouth at bedtime. 1 tablet once a day 90 tablet 3  . omeprazole (PRILOSEC) 10 MG capsule Take 1 capsule (10 mg total) by mouth daily. 90 capsule 0  . pregabalin (LYRICA) 75 MG capsule Take 2 capsules (150 mg total) by mouth 2 (two) times daily. 360 capsule 4  . rosuvastatin (CRESTOR) 10 MG tablet Take 1 tablet (10 mg total) by mouth daily. 90 tablet 0  . sodium chloride (OCEAN) 0.65 % nasal spray Place 1 spray into the nose as needed for congestion.    . SYMBICORT 160-4.5 MCG/ACT inhaler INHALE 2 PUFFS INTO THE  LUNGS 2 (TWO)TIMES DAILY 30.6 g 3  . vitamin E 400 UNIT capsule Take 400 Units by mouth daily.     No current facility-administered medications on file prior to visit.     Social History   Tobacco Use  . Smoking status: Former Smoker    Types: Cigarettes  . Smokeless tobacco: Never Used  Substance Use Topics  . Alcohol use: Yes    Alcohol/week: 0.6 oz    Types: 1 Glasses of wine per week    Comment: OCC  . Drug use: No    Review of Systems  Constitutional: Negative for chills and fever.  HENT: Positive for congestion and sore throat (resolved).   Respiratory: Positive for cough. Negative for shortness of breath and wheezing.   Cardiovascular: Negative for chest pain and palpitations.  Gastrointestinal: Negative for nausea and vomiting.  Neurological: Negative for headaches.      Objective:    BP 138/82 (BP Location: Left Arm, Patient Position: Sitting, Cuff Size: Normal)    Pulse 85   Temp 98.5 F (36.9 C) (Oral)   Resp 15   Wt 194 lb 2 oz (88.1 kg)   SpO2 96%   BMI 33.32 kg/m    Physical Exam  Constitutional: She appears well-developed and well-nourished.  HENT:  Head: Normocephalic and atraumatic.  Right Ear: Hearing, tympanic membrane, external ear and ear canal normal. No drainage, swelling or tenderness. No foreign bodies. Tympanic membrane is not erythematous and not bulging. No middle ear effusion. No decreased hearing is noted.  Left Ear: Hearing, tympanic membrane, external ear and ear canal normal. No drainage, swelling or tenderness. No foreign bodies. Tympanic membrane is not erythematous and not bulging.  No middle ear effusion. No decreased hearing is noted.  Nose: Rhinorrhea present. Right sinus exhibits no maxillary sinus tenderness and no frontal sinus tenderness. Left sinus exhibits no maxillary sinus tenderness and no frontal sinus tenderness.  Mouth/Throat: Uvula is midline, oropharynx is clear and moist and mucous membranes are normal. No oropharyngeal exudate, posterior oropharyngeal edema, posterior oropharyngeal erythema or tonsillar abscesses.  Eyes: Conjunctivae are normal.  Cardiovascular: Regular rhythm, normal heart sounds and normal pulses.  Pulmonary/Chest: Effort normal and breath sounds normal. She has no wheezes. She has no rhonchi. She has no rales.  Lymphadenopathy:       Head (right side): No submental, no submandibular, no tonsillar, no preauricular, no posterior auricular and no occipital adenopathy present.       Head (left side): No submental, no submandibular, no tonsillar, no preauricular, no posterior auricular and no occipital adenopathy present.    She has no cervical adenopathy.  Neurological: She is alert.  Skin: Skin is warm and dry.  Psychiatric: She has a normal mood and affect. Her speech is normal and behavior is normal. Thought content normal.  Vitals reviewed.      Assessment & Plan:   1. Acute  bronchitis, unspecified organism Well appearing. No acute respiratory distress. Agreed conservative management for next 1-2 days, if no improvement, patient understands to start antibiotic. She will let me know how she is doing  - azithromycin (ZITHROMAX) 250 MG tablet; Tale 500 mg PO on day 1, then 250 mg PO q24h x 4 days.  Dispense: 6 tablet; Refill: 0    I am having Robin Hill start on azithromycin. I am also having her maintain her levalbuterol, Cyanocobalamin (VITAMIN B12 PO), Albuterol Sulfate (PROAIR HFA IN), cholecalciferol, b complex vitamins, sodium chloride, vitamin E, Ascorbic Acid (VITAMIN  C PO), fluticasone, pregabalin, SYMBICORT, anastrozole, rosuvastatin, azelastine, omeprazole, and montelukast.   Meds ordered this encounter  Medications  . azithromycin (ZITHROMAX) 250 MG tablet    Sig: Tale 500 mg PO on day 1, then 250 mg PO q24h x 4 days.    Dispense:  6 tablet    Refill:  0    Order Specific Question:   Supervising Provider    Answer:   Crecencio Mc [[id number]]    Return precautions given.   Risks, benefits, and alternatives of the medications and treatment plan prescribed today were discussed, and patient expressed understanding.   Education regarding symptom management and diagnosis given to patient on AVS.  Continue to follow with Einar Pheasant, MD for routine health maintenance.   Robin Hill and I agreed with plan.   Mable Paris, FNP

## 2017-06-21 NOTE — Patient Instructions (Signed)
Water and mucinex are important to break up mucous  Use albuterol every 6 hours for first 24 hours to get good medication into the lungs and loosen congestion; after, you may use as needed and eventually stop all together when cough resolves.  I suspect that your infection is viral in nature.  As discussed, I advise that you wait to fill the antibiotic after 1-2 days of symptom management to see if your symptoms improve. If you do not show improvement, you may take the antibiotic as prescribed.  Please let me know if not better

## 2017-06-24 NOTE — Telephone Encounter (Signed)
Unread mychart message mailed to patient 

## 2017-07-06 NOTE — Progress Notes (Addendum)
Subjective:    Patient ID: Robin Hill, female    DOB: 12-01-1950, 67 y.o.   MRN: [id number]  HPI  Robin Hill is a 67 year old female who present today with right shoulder pain that started one month ago Pain is described as aching. Rated as a 5 Better with movement during water aerobics Worse with movement (stiff with movement) when not in water.  Treatments: acetaminophen and bengay have provided limited relief but pain persists She denies chest pain, palpitations, SOB, numbness, tingling, weakness, headaches, or edema. She is active with water aerobics and uses weights.  History of left breast Ca: she is followed by oncology. She was evaluated 12/2016 and oncologist noted no evidence of recurrence and mammogram 2018 in May was normal BMD in May 2018 was also noted as normal. She currently takes Calcium and Vitamin D. Patient is taking anastrozole  No history of arthritis. No history of trauma  Review of Systems  Constitutional: Negative for chills, fatigue and fever.  Respiratory: Negative for cough, shortness of breath and wheezing.   Cardiovascular: Negative for chest pain and palpitations.  Musculoskeletal: Negative for myalgias.       Right shoulder pain  Skin: Negative for rash.  Neurological: Negative for dizziness, weakness, numbness and headaches.   Past Medical History:  Diagnosis Date  . Allergic rhinitis   . Allergy   . Asthma   . Breast cancer ([place]) 2012   mastectomy with chemo and rad tx  . Diverticulosis   . Dysmenorrhea   . Hypercholesterolemia      Social History   Socioeconomic History  . Marital status: Divorced    Spouse name: Not on file  . Number of children: Not on file  . Years of education: Not on file  . Highest education level: Not on file  Social Needs  . Financial resource strain: Not on file  . Food insecurity - worry: Not on file  . Food insecurity - inability: Not on file  . Transportation needs - medical: Not on file  .  Transportation needs - non-medical: Not on file  Occupational History  . Not on file  Tobacco Use  . Smoking status: Former Smoker    Types: Cigarettes  . Smokeless tobacco: Never Used  Substance and Sexual Activity  . Alcohol use: Yes    Alcohol/week: 0.6 oz    Types: 1 Glasses of wine per week    Comment: OCC  . Drug use: No  . Sexual activity: No  Other Topics Concern  . Not on file  Social History Narrative  . Not on file    Past Surgical History:  Procedure Laterality Date  . BREAST BIOPSY Right 2012   benign  . BREAST SURGERY  2011   breast cancer  . CESAREAN SECTION  1970  . [place]  . CESAREAN SECTION  1987  . COLONOSCOPY WITH PROPOFOL N/A 10/03/2015   Procedure: COLONOSCOPY WITH PROPOFOL;  Surgeon: Manya Silvas, MD;  Location: [facility] ENDOSCOPY;  Service: Endoscopy;  Laterality: N/A;  . MASTECTOMY Left 2012   with chemo and rad tx  . NASAL SINUS SURGERY  07/2001    Family History  Problem Relation Age of Onset  . Cirrhosis Brother        liver  . Breast cancer Paternal Aunt        2 aunts    No Known Allergies  Current Outpatient Medications on File Prior to Visit  Medication Sig  Dispense Refill  . Albuterol Sulfate (PROAIR HFA IN) 90 mcg. 2 puffs using inhaler four times a day as needed.    Marland Kitchen anastrozole (ARIMIDEX) 1 MG tablet Take 1 tablet (1 mg total) by mouth daily. 90 tablet 3  . Ascorbic Acid (VITAMIN C PO) Take by mouth.    Marland Kitchen azelastine (ASTELIN) 0.1 % nasal spray Place 2 sprays into both nostrils 2 (two) times daily. 137 mcg. Spray 2 sprays into both nostrils twice a day 90 mL 1  . b complex vitamins capsule Take 1 capsule by mouth daily.    . cholecalciferol (VITAMIN D) 400 UNITS TABS Take 400 Units by mouth daily. Chewable    . Cyanocobalamin (VITAMIN B12 PO) Take 500 mcg by mouth daily. Administer 1 tablet by mouth once a day    . fluticasone (FLONASE) 50 MCG/ACT nasal spray Use 1 spray into each nostril twice a day 48 g 3  .  levalbuterol (XOPENEX HFA) 45 MCG/ACT inhaler Inhale 2 puffs four times a day as needed    . montelukast (SINGULAIR) 10 MG tablet Take 1 tablet (10 mg total) by mouth at bedtime. 1 tablet once a day 90 tablet 3  . omeprazole (PRILOSEC) 10 MG capsule Take 1 capsule (10 mg total) by mouth daily. 90 capsule 0  . pregabalin (LYRICA) 75 MG capsule Take 2 capsules (150 mg total) by mouth 2 (two) times daily. 360 capsule 4  . rosuvastatin (CRESTOR) 10 MG tablet Take 1 tablet (10 mg total) by mouth daily. 90 tablet 0  . sodium chloride (OCEAN) 0.65 % nasal spray Place 1 spray into the nose as needed for congestion.    . SYMBICORT 160-4.5 MCG/ACT inhaler INHALE 2 PUFFS INTO THE  LUNGS 2 (TWO)TIMES DAILY 30.6 g 3  . vitamin E 400 UNIT capsule Take 400 Units by mouth daily.     No current facility-administered medications on file prior to visit.     BP 128/82 (BP Location: Left Arm, Patient Position: Sitting, Cuff Size: Normal)   Pulse 83   Temp 98.4 F (36.9 C) (Oral)   Resp 16   Wt 194 lb (88 kg)   SpO2 97%   BMI 33.30 kg/m        Objective:   Physical Exam  Constitutional: She is oriented to person, place, and time. She appears well-developed and well-nourished.  Eyes: Pupils are equal, round, and reactive to light. No scleral icterus.  Neck: Neck supple.  Cardiovascular: Normal rate, regular rhythm and intact distal pulses.  Pulmonary/Chest: Effort normal and breath sounds normal. She has no wheezes. She has no rales.  Abdominal: Soft. Bowel sounds are normal. There is no tenderness.  Musculoskeletal: She exhibits no edema.  Shoulder: Inspection reveals no abnormalities or atrophy. Mild downward sloping present. Palpation is normal with no tenderness over AC joint or bicipital groove. ROM is full;  Pain over outer deltoid is present with overhead reaching. Mild discomfort noted with Neer at 90 degrees and empty can test No drop arm sign.   Lymphadenopathy:    She has no cervical  adenopathy.  Neurological: She is alert and oriented to person, place, and time. Coordination normal.  Skin: Skin is warm and dry. No rash noted.  Psychiatric: She has a normal mood and affect. Her behavior is normal. Judgment and thought content normal.          Assessment & Plan:   1. Acute pain of right shoulder Exam and history findings are most consistent with  poor muscular conditioning, improper use of weights,degenerative changes or inflammatory in nature which was exacerbated by lifting weight during water aerobics. Findings of discomfort with Neer at 90 degrees and mild discomfort with empty can testing are concerning for rotator cuff involvement and further evaluation. We discussed treatment options and agreed on a conservative therapy of trial of prednisone, use of ice and  referral to orthopedics to see Dr. Roland Rack. Consulted with Dr. Nicki Reaper regarding provider recommendations for orthopedics. . Strict return precautions provided.  - Ambulatory referral to Orthopedic Surgery - predniSONE (DELTASONE) 10 MG tablet; Take 4 tablets once daily for 2 days, 3 tabs daily for 2 days, 2 tabs daily for 2 days, 1 tab daily for 2 days.  Dispense: 20 tablet; Refill: 0  Delano Metz, FNP-C

## 2017-07-07 ENCOUNTER — Encounter: Payer: Self-pay | Admitting: Family Medicine

## 2017-07-07 ENCOUNTER — Ambulatory Visit (INDEPENDENT_AMBULATORY_CARE_PROVIDER_SITE_OTHER): Payer: Medicare HMO | Admitting: Family Medicine

## 2017-07-07 VITALS — BP 128/82 | HR 83 | Temp 98.4°F | Resp 16 | Wt 194.0 lb

## 2017-07-07 DIAGNOSIS — M25511 Pain in right shoulder: Secondary | ICD-10-CM | POA: Insufficient documentation

## 2017-07-07 MED ORDER — PREDNISONE 10 MG PO TABS: ORAL_TABLET | ORAL | 0 refills | Status: DC

## 2017-07-07 NOTE — Patient Instructions (Signed)
Please take prednisone with food as directed.  You will be contact about your referral.  Please let us know if you have not heard back within 1 week about your referral.   Shoulder Pain Many things can cause shoulder pain, including:  An injury.  Moving the arm in the same way again and again (overuse).  Joint pain (arthritis).  Follow these instructions at home: Take these actions to help with your pain:  Squeeze a soft ball or a foam pad as much as you can. This helps to prevent swelling. It also makes the arm stronger.  Take over-the-counter and prescription medicines only as told by your doctor.  If told, put ice on the area: ? Put ice in a plastic bag. ? Place a towel between your skin and the bag. ? Leave the ice on for 20 minutes, 2-3 times per day. Stop putting on ice if it does not help with the pain.  If you were given a shoulder sling or immobilizer: ? Wear it as told. ? Remove it to shower or bathe. ? Move your arm as little as possible. ? Keep your hand moving. This helps prevent swelling.  Contact a doctor if:  Your pain gets worse.  Medicine does not help your pain.  You have new pain in your arm, hand, or fingers. Get help right away if:  Your arm, hand, or fingers: ? Tingle. ? Are numb. ? Are swollen. ? Are painful. ? Turn white or blue. This information is not intended to replace advice given to you by your health care provider. Make sure you discuss any questions you have with your health care provider. Document Released: 10/06/2007 Document Revised: 12/14/2015 Document Reviewed: 08/12/2014 Elsevier Interactive Patient Education  [employer].

## 2017-07-25 DIAGNOSIS — M19019 Primary osteoarthritis, unspecified shoulder: Secondary | ICD-10-CM | POA: Diagnosis not present

## 2017-07-25 DIAGNOSIS — M25511 Pain in right shoulder: Secondary | ICD-10-CM | POA: Diagnosis not present

## 2017-07-25 DIAGNOSIS — M7541 Impingement syndrome of right shoulder: Secondary | ICD-10-CM | POA: Diagnosis not present

## 2017-08-01 ENCOUNTER — Other Ambulatory Visit: Payer: Self-pay | Admitting: Internal Medicine

## 2017-08-11 ENCOUNTER — Encounter: Payer: Medicare Other | Admitting: Internal Medicine

## 2017-08-24 ENCOUNTER — Encounter: Payer: Self-pay | Admitting: Internal Medicine

## 2017-08-24 ENCOUNTER — Other Ambulatory Visit (HOSPITAL_COMMUNITY)
Admission: RE | Admit: 2017-08-24 | Discharge: 2017-08-24 | Disposition: A | Discharge status: Home / Self Care | Payer: Medicare HMO | Source: Ambulatory Visit | Attending: Internal Medicine | Admitting: Internal Medicine

## 2017-08-24 ENCOUNTER — Ambulatory Visit (INDEPENDENT_AMBULATORY_CARE_PROVIDER_SITE_OTHER): Payer: Medicare HMO | Admitting: Internal Medicine

## 2017-08-24 VITALS — BP 124/76 | HR 74 | Temp 98.0°F | Resp 18 | Ht 64.0 in | Wt 194.6 lb

## 2017-08-24 DIAGNOSIS — E7800 Pure hypercholesterolemia, unspecified: Secondary | ICD-10-CM | POA: Diagnosis not present

## 2017-08-24 DIAGNOSIS — M25511 Pain in right shoulder: Secondary | ICD-10-CM

## 2017-08-24 DIAGNOSIS — Z6833 Body mass index (BMI) 33.0-33.9, adult: Secondary | ICD-10-CM

## 2017-08-24 DIAGNOSIS — Z9109 Other allergy status, other than to drugs and biological substances: Secondary | ICD-10-CM

## 2017-08-24 DIAGNOSIS — K219 Gastro-esophageal reflux disease without esophagitis: Secondary | ICD-10-CM | POA: Diagnosis not present

## 2017-08-24 DIAGNOSIS — Z0000 Encounter for general adult medical examination without abnormal findings: Secondary | ICD-10-CM | POA: Diagnosis not present

## 2017-08-24 DIAGNOSIS — Z170 Estrogen receptor positive status [ER+]: Secondary | ICD-10-CM | POA: Diagnosis not present

## 2017-08-24 DIAGNOSIS — R739 Hyperglycemia, unspecified: Secondary | ICD-10-CM

## 2017-08-24 DIAGNOSIS — Z124 Encounter for screening for malignant neoplasm of cervix: Secondary | ICD-10-CM

## 2017-08-24 DIAGNOSIS — C50812 Malignant neoplasm of overlapping sites of left female breast: Secondary | ICD-10-CM | POA: Diagnosis not present

## 2017-08-24 NOTE — Progress Notes (Signed)
Patient ID: Robin Hill, female   DOB: November 16, 1950, 67 y.o.   MRN: [id number]   Subjective:    Patient ID: Robin Hill, female    DOB: [birth year], 67 y.o.   MRN: [id number]  HPI  Patient here for her physical exam.  She has been having pain in her right shoulder.  Seeing Dr Candelaria Stagers.  S/p injection.  Still with limited rom.  Increased pain especially at or above 90 degrees.  Has been trying to do exercises.  Request f/u with him.  No chest pain.  No sob.  No acid reflux.  No abdominal pain.  Bowels moving.  Saw Dr Rogue Bussing 12/2016.  Recommended f/u in 12 months.  On arimidex.  Plans to continue until 2022.     Past Medical History:  Diagnosis Date  . Allergic rhinitis   . Allergy   . Asthma   . Breast cancer ([place]) 2012   mastectomy with chemo and rad tx  . Diverticulosis   . Dysmenorrhea   . Hypercholesterolemia    Past Surgical History:  Procedure Laterality Date  . BREAST BIOPSY Right 2012   benign  . BREAST SURGERY  2011   breast cancer  . CESAREAN SECTION  1970  . [place]  . CESAREAN SECTION  1987  . COLONOSCOPY WITH PROPOFOL N/A 10/03/2015   Procedure: COLONOSCOPY WITH PROPOFOL;  Surgeon: Manya Silvas, MD;  Location: [facility] ENDOSCOPY;  Service: Endoscopy;  Laterality: N/A;  . MASTECTOMY Left 2012   with chemo and rad tx  . NASAL SINUS SURGERY  07/2001   Family History  Problem Relation Age of Onset  . Cirrhosis Brother        liver  . Breast cancer Paternal Aunt        2 aunts   Social History   Socioeconomic History  . Marital status: Divorced    Spouse name: Not on file  . Number of children: Not on file  . Years of education: Not on file  . Highest education level: Not on file  Occupational History  . Not on file  Social Needs  . Financial resource strain: Not on file  . Food insecurity:    Worry: Not on file    Inability: Not on file  . Transportation needs:    Medical: Not on file    Non-medical: Not on file  Tobacco Use    . Smoking status: Former Smoker    Types: Cigarettes  . Smokeless tobacco: Never Used  Substance and Sexual Activity  . Alcohol use: Yes    Alcohol/week: 0.6 oz    Types: 1 Glasses of wine per week    Comment: OCC  . Drug use: No  . Sexual activity: Never  Lifestyle  . Physical activity:    Days per week: Not on file    Minutes per session: Not on file  . Stress: Not on file  Relationships  . Social connections:    Talks on phone: Not on file    Gets together: Not on file    Attends religious service: Not on file    Active member of club or organization: Not on file    Attends meetings of clubs or organizations: Not on file    Relationship status: Not on file  Other Topics Concern  . Not on file  Social History Narrative  . Not on file    Outpatient Encounter Medications as of 08/24/2017  Medication Sig  . Albuterol Sulfate (  PROAIR HFA IN) 90 mcg. 2 puffs using inhaler four times a day as needed.  Marland Kitchen anastrozole (ARIMIDEX) 1 MG tablet Take 1 tablet (1 mg total) by mouth daily.  . Ascorbic Acid (VITAMIN C PO) Take by mouth.  Marland Kitchen azelastine (ASTELIN) 0.1 % nasal spray Place 2 sprays into both nostrils 2 (two) times daily. 137 mcg. Spray 2 sprays into both nostrils twice a day  . b complex vitamins capsule Take 1 capsule by mouth daily.  . cholecalciferol (VITAMIN D) 400 UNITS TABS Take 400 Units by mouth daily. Chewable  . Cyanocobalamin (VITAMIN B12 PO) Take 500 mcg by mouth daily. Administer 1 tablet by mouth once a day  . fluticasone (FLONASE) 50 MCG/ACT nasal spray Use 1 spray into each nostril twice a day  . levalbuterol (XOPENEX HFA) 45 MCG/ACT inhaler Inhale 2 puffs four times a day as needed  . montelukast (SINGULAIR) 10 MG tablet Take 1 tablet (10 mg total) by mouth at bedtime. 1 tablet once a day  . omeprazole (PRILOSEC) 10 MG capsule Take 1 capsule (10 mg total) by mouth daily.  . pregabalin (LYRICA) 75 MG capsule Take 2 capsules (150 mg total) by mouth 2 (two) times  daily.  . rosuvastatin (CRESTOR) 10 MG tablet TAKE 1 TABLET EVERY DAY  . sodium chloride (OCEAN) 0.65 % nasal spray Place 1 spray into the nose as needed for congestion.  . SYMBICORT 160-4.5 MCG/ACT inhaler INHALE 2 PUFFS INTO THE  LUNGS 2 (TWO)TIMES DAILY  . vitamin E 400 UNIT capsule Take 400 Units by mouth daily.  . [DISCONTINUED] predniSONE (DELTASONE) 10 MG tablet Take 4 tablets once daily for 2 days, 3 tabs daily for 2 days, 2 tabs daily for 2 days, 1 tab daily for 2 days.   No facility-administered encounter medications on file as of 08/24/2017.     Review of Systems  Constitutional: Negative for appetite change and unexpected weight change.  HENT: Negative for congestion and sinus pressure.   Eyes: Negative for pain and visual disturbance.  Respiratory: Negative for cough, chest tightness and shortness of breath.   Cardiovascular: Negative for chest pain, palpitations and leg swelling.  Gastrointestinal: Negative for abdominal pain, diarrhea, nausea and vomiting.  Genitourinary: Negative for difficulty urinating and dysuria.  Musculoskeletal: Negative for myalgias.       Right shoulder pain and limited rom as outlined.    Skin: Negative for color change and rash.  Neurological: Negative for dizziness, light-headedness and headaches.  Hematological: Negative for adenopathy. Does not bruise/bleed easily.  Psychiatric/Behavioral: Negative for agitation and dysphoric mood.       Objective:     Blood pressure rechecked by me:  128/72  Physical Exam  Constitutional: She is oriented to person, place, and time. She appears well-developed and well-nourished. No distress.  HENT:  Nose: Nose normal.  Mouth/Throat: Oropharynx is clear and moist.  Eyes: Conjunctivae are normal. Right eye exhibits no discharge. Left eye exhibits no discharge.  Neck: Neck supple. No thyromegaly present.  Cardiovascular: Normal rate and regular rhythm.  Pulmonary/Chest: Breath sounds normal. No  accessory muscle usage. No tachypnea. No respiratory distress. She has no decreased breath sounds. She has no wheezes. She has no rhonchi. Right breast exhibits no inverted nipple, no mass, no nipple discharge and no tenderness (no axillary adenopathy). Left breast exhibits no inverted nipple, no mass, no nipple discharge and no tenderness (no axilarry adenopathy).  Abdominal: Soft. Bowel sounds are normal. There is no tenderness.  Genitourinary:  Genitourinary  Comments: Normal external genitalia.  Vaginal vault without lesions.  Cervix identified.  Pap smear performed.  Could not appreciate any adnexal masses or tenderness.    Musculoskeletal: She exhibits no edema or tenderness.  Increased pain with abduction right arm - at or above 90 degress.    Lymphadenopathy:    She has no cervical adenopathy.  Neurological: She is alert and oriented to person, place, and time.  Skin: No rash noted. No erythema.  Psychiatric: She has a normal mood and affect. Her behavior is normal.    BP 124/76 (BP Location: Right Arm, Patient Position: Sitting, Cuff Size: Normal)   Pulse 74   Temp 98 F (36.7 C) (Oral)   Resp 18   Ht _0  (1.626 m)   Wt 194 lb 9.6 oz (88.3 kg)   SpO2 99%   BMI 33.40 kg/m  Wt Readings from Last 3 Encounters:  08/24/17 194 lb 9.6 oz (88.3 kg)  07/07/17 194 lb (88 kg)  06/21/17 194 lb 2 oz (88.1 kg)     Lab Results  Component Value Date   WBC 7.0 12/27/2016   HGB 12.5 12/27/2016   HCT 35.8 12/27/2016   PLT 170 12/27/2016   GLUCOSE 108 (H) 12/27/2016   CHOL 180 09/23/2016   TRIG 83.0 09/23/2016   HDL 72.80 09/23/2016   LDLDIRECT 126.2 05/09/2013   LDLCALC 91 09/23/2016   ALT 24 12/27/2016   AST 30 12/27/2016   NA 137 12/27/2016   K 3.9 12/27/2016   CL 102 12/27/2016   CREATININE 0.97 12/27/2016   BUN 12 12/27/2016   CO2 30 12/27/2016   TSH 2.76 09/23/2016   HGBA1C 6.1 09/23/2016   MICROALBUR <0.7 09/09/2014    Dg Bone Density  Result Date:  12/20/2016 EXAM: DUAL X-RAY ABSORPTIOMETRY (DXA) FOR BONE MINERAL DENSITY IMPRESSION: Dear Dr. Owens Shark, Your patient Robin Hill completed a BMD test on 12/20/2016 using the [place] (analysis version: 14.10) manufactured by [employer]. The following summarizes the results of our evaluation. PATIENT BIOGRAPHICAL: Name: Robin Hill, Robin Hill Patient ID: [id number] Birth Date: December 22, 1950 Height: 64.0 in. Gender: Female Exam Date: 12/20/2016 Weight: 208.0 lbs. Indications: Breast CA, Postmenopausal Fractures: Treatments: Anastrozole, Calcium, Multi-Vitamin with calcium, Vitamin D ASSESSMENT: The BMD measured at AP Spine L1-L4 is 1.160 g/cm2 with a T-score of -0.3. This patient is considered normal according to [facility]) criteria. Site Region Measured Measured WHO Young Adult BMD Date       Age      Classification T-score AP Spine L1-L4 12/20/2016 66.8 Normal -0.3 1.160 g/cm2 AP Spine L1-L4 12/26/2012 62.8 Normal 0.3 1.232 g/cm2 DualFemur Neck Left 12/20/2016 66.8 Normal 0.3 1.084 g/cm2 DualFemur Neck Left 12/26/2012 62.8 Normal 0.4 1.093 g/cm2 World Health Organization [facility]) criteria for post-menopausal, Caucasian Women: Normal:       T-score at or above -1 SD Osteopenia:   T-score between -1 and -2.5 SD Osteoporosis: T-score at or below -2.5 SD RECOMMENDATIONS: [place] recommends that FDA-approved medical therapies be considered in postmenopausal women and men age 56 or older with a: 1. Hip or vertebral (clinical or morphometric) fracture. 2. T-score of < -2.5 at the spine or hip. 3. Ten-year fracture probability by FRAX of 3% or greater for hip fracture or 20% or greater for major osteoporotic fracture. All treatment decisions require clinical judgment and consideration of individual patient factors, including patient preferences, co-morbidities, previous drug use, risk factors not captured in the FRAX model (e.g. falls, vitamin  D deficiency, increased bone  turnover, interval significant decline in bone density) and possible under - or over-estimation of fracture risk by FRAX. All patients should ensure an adequate intake of dietary calcium (1200 mg/d) and vitamin D (800 IU daily) unless contraindicated. FOLLOW-UP: People with diagnosed cases of osteoporosis or at high risk for fracture should have regular bone mineral density tests. For patients eligible for Medicare, routine testing is allowed once every 2 years. The testing frequency can be increased to one year for patients who have rapidly progressing disease, those who are receiving or discontinuing medical therapy to restore bone mass, or have additional risk factors. I have reviewed this report, and agree with the above findings. [facility] Radiology Electronically Signed   By: Dorise Bullion III M.D   On: 12/20/2016 15:13       Assessment & Plan:   Problem List Items Addressed This Visit    BMI 33.0-33.9,adult    Diet and exercise.  Follow.        Carcinoma of overlapping sites of left breast in female, estrogen receptor positive ([place])    Saw Dr Rogue Bussing 12/2017.  Note reviewed.  Stable.  Recommended f/u in 12 months.  On arimidex.  Recommended continuing until 2022.        Environmental allergies    Doing relatively well on current regimen.  Follow.        GERD (gastroesophageal reflux disease)    Controlled on current regimen.  Follow.        Health care maintenance    Physical today 08/24/17. PAP 08/24/17.  Mammogram 08/2016 birads I.  Scheduled for f/u mammogram.  Followed by oncology.  Colonoscopy 10/2015 - as outlined.  Normal bone density 12/2016.        Hypercholesterolemia    On crestor.  Low cholesterol diet and exercise.  Follow lipid panel and liver function tests.        Relevant Orders   TSH   Lipid panel   Hepatic function panel   Hyperglycemia    Low carb diet and exercise.  Follow met b and a1c.        Relevant Orders   Hemoglobin U2G   Basic metabolic  panel   Shoulder pain, right    Persistent pain s/p injection.  Has seen Dr Candelaria Stagers.  Request f/u.        Relevant Orders   Ambulatory referral to Orthopedic Surgery    Other Visit Diagnoses    Screening for cervical cancer    -  Primary   Relevant Orders   Cytology - PAP       Einar Pheasant, MD

## 2017-08-27 ENCOUNTER — Encounter: Payer: Self-pay | Admitting: Internal Medicine

## 2017-08-27 NOTE — Assessment & Plan Note (Signed)
Low carb diet and exercise.  Follow met b and a1c.   

## 2017-08-27 NOTE — Assessment & Plan Note (Signed)
Physical today 08/24/17. PAP 08/24/17.  Mammogram 08/2016 birads I.  Scheduled for f/u mammogram.  Followed by oncology.  Colonoscopy 10/2015 - as outlined.  Normal bone density 12/2016.

## 2017-08-27 NOTE — Assessment & Plan Note (Signed)
Controlled on current regimen.  Follow.  

## 2017-08-27 NOTE — Assessment & Plan Note (Signed)
Persistent pain s/p injection.  Has seen Dr Candelaria Stagers.  Request f/u.

## 2017-08-27 NOTE — Assessment & Plan Note (Signed)
On crestor.  Low cholesterol diet and exercise.  Follow lipid panel and liver function tests.   

## 2017-08-27 NOTE — Assessment & Plan Note (Signed)
Diet and exercise.  Follow.  

## 2017-08-27 NOTE — Assessment & Plan Note (Signed)
Saw Dr Rogue Bussing 12/2017.  Note reviewed.  Stable.  Recommended f/u in 12 months.  On arimidex.  Recommended continuing until 2022.

## 2017-08-27 NOTE — Assessment & Plan Note (Signed)
Doing relatively well on current regimen.  Follow.

## 2017-08-30 ENCOUNTER — Encounter: Payer: Self-pay | Admitting: Internal Medicine

## 2017-08-30 LAB — CYTOLOGY - PAP
DIAGNOSIS: NEGATIVE
HPV: NOT DETECTED

## 2017-09-01 ENCOUNTER — Other Ambulatory Visit (INDEPENDENT_AMBULATORY_CARE_PROVIDER_SITE_OTHER): Payer: Medicare HMO

## 2017-09-01 DIAGNOSIS — R739 Hyperglycemia, unspecified: Secondary | ICD-10-CM

## 2017-09-01 DIAGNOSIS — E7800 Pure hypercholesterolemia, unspecified: Secondary | ICD-10-CM | POA: Diagnosis not present

## 2017-09-01 LAB — HEMOGLOBIN A1C: Hgb A1c MFr Bld: 6 % (ref 4.6–6.5)

## 2017-09-01 LAB — LIPID PANEL
Cholesterol: 182 mg/dL (ref 0–200)
HDL: 69.3 mg/dL (ref >39.00)
LDL Cholesterol: 100 mg/dL — ABNORMAL HIGH (ref 0–99)
NONHDL: 112.47
Total CHOL/HDL Ratio: 3
Triglycerides: 60 mg/dL (ref 0.0–149.0)
VLDL: 12 mg/dL (ref 0.0–40.0)

## 2017-09-01 LAB — HEPATIC FUNCTION PANEL
ALBUMIN: 3.8 g/dL (ref 3.5–5.2)
ALK PHOS: 60 U/L (ref 39–117)
ALT: 20 U/L (ref 0–35)
AST: 24 U/L (ref 0–37)
BILIRUBIN DIRECT: 0.1 mg/dL (ref 0.0–0.3)
Total Bilirubin: 0.5 mg/dL (ref 0.2–1.2)
Total Protein: 6.7 g/dL (ref 6.0–8.3)

## 2017-09-01 LAB — BASIC METABOLIC PANEL
BUN: 17 mg/dL (ref 6–23)
CO2: 29 mEq/L (ref 19–32)
Calcium: 9.5 mg/dL (ref 8.4–10.5)
Chloride: 106 mEq/L (ref 96–112)
Creatinine, Ser: 0.89 mg/dL (ref 0.40–1.20)
GFR: 81.23 mL/min (ref >60.00)
Glucose, Bld: 102 mg/dL — ABNORMAL HIGH (ref 70–99)
POTASSIUM: 4.1 meq/L (ref 3.5–5.1)
SODIUM: 141 meq/L (ref 135–145)

## 2017-09-01 LAB — TSH: TSH: 2.4 u[IU]/mL (ref 0.35–4.50)

## 2017-09-02 ENCOUNTER — Encounter: Payer: Self-pay | Admitting: Internal Medicine

## 2017-09-07 DIAGNOSIS — G8929 Other chronic pain: Secondary | ICD-10-CM | POA: Diagnosis not present

## 2017-09-07 DIAGNOSIS — M25511 Pain in right shoulder: Secondary | ICD-10-CM | POA: Diagnosis not present

## 2017-09-07 DIAGNOSIS — M7541 Impingement syndrome of right shoulder: Secondary | ICD-10-CM | POA: Diagnosis not present

## 2017-09-07 DIAGNOSIS — M19019 Primary osteoarthritis, unspecified shoulder: Secondary | ICD-10-CM | POA: Diagnosis not present

## 2017-09-08 ENCOUNTER — Ambulatory Visit
Admission: RE | Admit: 2017-09-08 | Discharge: 2017-09-08 | Disposition: A | Discharge status: Home / Self Care | Payer: Medicare HMO | Source: Ambulatory Visit | Attending: Internal Medicine | Admitting: Internal Medicine

## 2017-09-08 DIAGNOSIS — C50812 Malignant neoplasm of overlapping sites of left female breast: Secondary | ICD-10-CM

## 2017-09-08 DIAGNOSIS — Z1231 Encounter for screening mammogram for malignant neoplasm of breast: Secondary | ICD-10-CM | POA: Insufficient documentation

## 2017-09-08 DIAGNOSIS — Z170 Estrogen receptor positive status [ER+]: Secondary | ICD-10-CM

## 2017-10-28 ENCOUNTER — Other Ambulatory Visit: Payer: Self-pay | Admitting: *Deleted

## 2017-10-28 DIAGNOSIS — G629 Polyneuropathy, unspecified: Secondary | ICD-10-CM

## 2017-10-28 MED ORDER — PREGABALIN 75 MG PO CAPS: 150.0000 mg | ORAL_CAPSULE | Freq: Two times a day (BID) | ORAL | 4 refills | Status: DC

## 2017-11-23 DIAGNOSIS — C50912 Malignant neoplasm of unspecified site of left female breast: Secondary | ICD-10-CM | POA: Diagnosis not present

## 2017-12-08 DIAGNOSIS — H2513 Age-related nuclear cataract, bilateral: Secondary | ICD-10-CM | POA: Diagnosis not present

## 2017-12-22 DIAGNOSIS — C50912 Malignant neoplasm of unspecified site of left female breast: Secondary | ICD-10-CM | POA: Diagnosis not present

## 2017-12-28 ENCOUNTER — Other Ambulatory Visit: Payer: Self-pay | Admitting: *Deleted

## 2017-12-28 ENCOUNTER — Inpatient Hospital Stay (HOSPITAL_BASED_OUTPATIENT_CLINIC_OR_DEPARTMENT_OTHER): Payer: Medicare HMO | Admitting: Internal Medicine

## 2017-12-28 ENCOUNTER — Other Ambulatory Visit: Payer: Self-pay

## 2017-12-28 ENCOUNTER — Inpatient Hospital Stay: Payer: Medicare HMO | Attending: Internal Medicine

## 2017-12-28 VITALS — BP 111/73 | HR 71 | Temp 98.1°F | Resp 16 | Wt 194.0 lb

## 2017-12-28 DIAGNOSIS — Z87891 Personal history of nicotine dependence: Secondary | ICD-10-CM

## 2017-12-28 DIAGNOSIS — Z803 Family history of malignant neoplasm of breast: Secondary | ICD-10-CM | POA: Insufficient documentation

## 2017-12-28 DIAGNOSIS — C50812 Malignant neoplasm of overlapping sites of left female breast: Secondary | ICD-10-CM

## 2017-12-28 DIAGNOSIS — Z170 Estrogen receptor positive status [ER+]: Secondary | ICD-10-CM | POA: Diagnosis not present

## 2017-12-28 DIAGNOSIS — Z9012 Acquired absence of left breast and nipple: Secondary | ICD-10-CM | POA: Diagnosis not present

## 2017-12-28 DIAGNOSIS — Z79811 Long term (current) use of aromatase inhibitors: Secondary | ICD-10-CM

## 2017-12-28 DIAGNOSIS — G629 Polyneuropathy, unspecified: Secondary | ICD-10-CM

## 2017-12-28 LAB — COMPREHENSIVE METABOLIC PANEL
ALK PHOS: 61 U/L (ref 38–126)
ALT: 21 U/L (ref 0–44)
ANION GAP: 9 (ref 5–15)
AST: 29 U/L (ref 15–41)
Albumin: 4.3 g/dL (ref 3.5–5.0)
BILIRUBIN TOTAL: 0.9 mg/dL (ref 0.3–1.2)
BUN: 15 mg/dL (ref 8–23)
CALCIUM: 10 mg/dL (ref 8.9–10.3)
CO2: 28 mmol/L (ref 22–32)
Chloride: 106 mmol/L (ref 98–111)
Creatinine, Ser: 0.85 mg/dL (ref 0.44–1.00)
GFR calc Af Amer: >60 mL/min (ref >60)
Glucose, Bld: 113 mg/dL — ABNORMAL HIGH (ref 70–99)
POTASSIUM: 4.1 mmol/L (ref 3.5–5.1)
Sodium: 143 mmol/L (ref 135–145)
TOTAL PROTEIN: 7.3 g/dL (ref 6.5–8.1)

## 2017-12-28 LAB — CBC WITH DIFFERENTIAL/PLATELET
Basophils Absolute: 0.1 10*3/uL (ref 0–0.1)
Basophils Relative: 1 %
Eosinophils Absolute: 0.4 10*3/uL (ref 0–0.7)
Eosinophils Relative: 9 %
HEMATOCRIT: 37.4 % (ref 35.0–47.0)
HEMOGLOBIN: 12.6 g/dL (ref 12.0–16.0)
LYMPHS PCT: 40 %
Lymphs Abs: 2 10*3/uL (ref 1.0–3.6)
MCH: 30.4 pg (ref 26.0–34.0)
MCHC: 33.6 g/dL (ref 32.0–36.0)
MCV: 90.3 fL (ref 80.0–100.0)
MONO ABS: 0.3 10*3/uL (ref 0.2–0.9)
MONOS PCT: 7 %
NEUTROS ABS: 2.1 10*3/uL (ref 1.4–6.5)
Neutrophils Relative %: 43 %
Platelets: 182 10*3/uL (ref 150–440)
RBC: 4.14 MIL/uL (ref 3.80–5.20)
RDW: 12.9 % (ref 11.5–14.5)
WBC: 4.9 10*3/uL (ref 3.6–11.0)

## 2017-12-28 NOTE — Assessment & Plan Note (Addendum)
Left breast Ca- stage I ER/PR positive HER-2/neu negative status post mastectomy currently on adjuvant aromatase inhibitor. STABLE.  # On extended AI therapy [aug 2022]  # BMD- may 2018- Normal; CA=Vit D. STABLE.   # Chronic peripheral neuropathy stable. Continue Lyrica '75mg'$  BID- STABLE.   # follow up in 12 months/ labs; mammogram in may 2020.

## 2017-12-28 NOTE — Progress Notes (Signed)
[facility] OFFICE PROGRESS NOTE  Patient Care Team: Einar Pheasant, MD as PCP - General (Internal Medicine)  Cancer Staging No matching staging information was found for the patient.   Oncology History   # 2011- LEFT BREAST [Dr.Smith; Dr.Pandit] Stage I IDC with lobular features pT1c (1.9cm) pSNmi-s/p Lumpec & RT; AC- Taxol x10 [sec to PN]; ER/PR-Pos; Her 2 NEU. [facility]- Aug 2017- Low risk of recurrence 1.3%; however- benefit from extended AI [node pos pt]  # 2012- RIGHT BREAST DCIS s/p mastectomy- on AI'  # MAY 2018- BMD- Normal.   # PN- G2- on Lyrica -----------------------------------------------------------------  DIAGNOSIS: LEFT BREAST CA  STAGE:   I      ;GOALS: curative  CURRENT/MOST RECENT THERAPY: Arimidex      Carcinoma of overlapping sites of left breast in female, estrogen receptor positive ([place])      INTERVAL HISTORY:  Robin Hill 67 y.o.  female pleasant patient above history of stage I breast cancer is here for follow-up.  Denies any significant joint pains hot flashes.  Denies any nausea vomiting.  Appetite is good.  No weight loss.  Review of Systems  Constitutional: Negative for chills, diaphoresis, fever, malaise/fatigue and weight loss.  HENT: Negative for nosebleeds and sore throat.   Eyes: Negative for double vision.  Respiratory: Negative for cough, hemoptysis, sputum production, shortness of breath and wheezing.   Cardiovascular: Negative for chest pain, palpitations, orthopnea and leg swelling.  Gastrointestinal: Negative for abdominal pain, blood in stool, constipation, diarrhea, heartburn, melena, nausea and vomiting.  Genitourinary: Negative for dysuria, frequency and urgency.  Musculoskeletal: Negative for back pain and joint pain.  Skin: Negative.  Negative for itching and rash.  Neurological: Negative for dizziness, tingling, focal weakness, weakness and headaches.  Endo/Heme/Allergies: Does not bruise/bleed easily.   Psychiatric/Behavioral: Negative for depression. The patient is not nervous/anxious and does not have insomnia.       PAST MEDICAL HISTORY :  Past Medical History:  Diagnosis Date  . Allergic rhinitis   . Allergy   . Asthma   . Breast cancer ([place]) 2012   mastectomy with chemo and rad tx  . Diverticulosis   . Dysmenorrhea   . Hypercholesterolemia     PAST SURGICAL HISTORY :   Past Surgical History:  Procedure Laterality Date  . BREAST BIOPSY Right 2012   benign  . BREAST SURGERY  2011   breast cancer  . CESAREAN SECTION  1970  . [place]  . CESAREAN SECTION  1987  . COLONOSCOPY WITH PROPOFOL N/A 10/03/2015   Procedure: COLONOSCOPY WITH PROPOFOL;  Surgeon: Manya Silvas, MD;  Location: [facility] ENDOSCOPY;  Service: Endoscopy;  Laterality: N/A;  . MASTECTOMY Left 2012   with chemo and rad tx  . NASAL SINUS SURGERY  07/2001    FAMILY HISTORY :   Family History  Problem Relation Age of Onset  . Cirrhosis Brother        liver  . Breast cancer Paternal Aunt        2 aunts    SOCIAL HISTORY:   Social History   Tobacco Use  . Smoking status: Former Smoker    Types: Cigarettes  . Smokeless tobacco: Never Used  Substance Use Topics  . Alcohol use: Yes    Alcohol/week: 1.0 standard drinks    Types: 1 Glasses of wine per week    Comment: OCC  . Drug use: No    ALLERGIES:  has No Known Allergies.  MEDICATIONS:  Current Outpatient Medications  Medication Sig Dispense Refill  . Albuterol Sulfate (PROAIR HFA IN) 90 mcg. 2 puffs using inhaler four times a day as needed.    Marland Kitchen anastrozole (ARIMIDEX) 1 MG tablet Take 1 tablet (1 mg total) by mouth daily. 90 tablet 3  . b complex vitamins capsule Take 1 capsule by mouth daily.    . cholecalciferol (VITAMIN D) 400 UNITS TABS Take 400 Units by mouth daily. Chewable    . Cyanocobalamin (VITAMIN B12 PO) Take 500 mcg by mouth daily. Administer 1 tablet by mouth once a day    . fluticasone (FLONASE) 50 MCG/ACT  nasal spray Use 1 spray into each nostril twice a day 48 g 3  . levalbuterol (XOPENEX HFA) 45 MCG/ACT inhaler Inhale 2 puffs four times a day as needed    . montelukast (SINGULAIR) 10 MG tablet Take 1 tablet (10 mg total) by mouth at bedtime. 1 tablet once a day 90 tablet 3  . omeprazole (PRILOSEC) 10 MG capsule Take 1 capsule (10 mg total) by mouth daily. 90 capsule 0  . pregabalin (LYRICA) 75 MG capsule Take 2 capsules (150 mg total) by mouth 2 (two) times daily. 360 capsule 4  . rosuvastatin (CRESTOR) 10 MG tablet TAKE 1 TABLET EVERY DAY 90 tablet 0  . sodium chloride (OCEAN) 0.65 % nasal spray Place 1 spray into the nose as needed for congestion.    . SYMBICORT 160-4.5 MCG/ACT inhaler INHALE 2 PUFFS INTO THE  LUNGS 2 (TWO)TIMES DAILY 30.6 g 3  . vitamin E 400 UNIT capsule Take 400 Units by mouth daily.    . Ascorbic Acid (VITAMIN C PO) Take by mouth.    Marland Kitchen azelastine (ASTELIN) 0.1 % nasal spray Place 2 sprays into both nostrils 2 (two) times daily. 137 mcg. Spray 2 sprays into both nostrils twice a day (Patient not taking: Reported on 12/28/2017) 90 mL 1   No current facility-administered medications for this visit.     PHYSICAL EXAMINATION: ECOG PERFORMANCE STATUS: 0 - Asymptomatic  BP 111/73 (BP Location: Left Arm, Patient Position: Sitting)   Pulse 71   Temp 98.1 F (36.7 C)   Resp 16   Wt 194 lb (88 kg)   BMI 33.30 kg/m   Filed Weights   12/28/17 1441  Weight: 194 lb (88 kg)    Physical Exam  Constitutional: She is oriented to person, place, and time and well-developed, well-nourished, and in no distress.  HENT:  Head: Normocephalic and atraumatic.  Mouth/Throat: Oropharynx is clear and moist. No oropharyngeal exudate.  Eyes: Pupils are equal, round, and reactive to light.  Neck: Normal range of motion. Neck supple.  Cardiovascular: Normal rate and regular rhythm.  Pulmonary/Chest: No respiratory distress. She has no wheezes.  Abdominal: Soft. Bowel sounds are normal.  She exhibits no distension and no mass. There is no tenderness. There is no rebound and no guarding.  Musculoskeletal: Normal range of motion. She exhibits no edema or tenderness.  Neurological: She is alert and oriented to person, place, and time.  Skin: Skin is warm.  LEFT BREAST exam (in the presence of nurse)- no unusual skin changes or dominant masses felt. Surgical scars noted. RIGHT-mastectomy noted no lumps or bumps.   Psychiatric: Affect normal.       LABORATORY DATA:  I have reviewed the data as listed    Component Value Date/Time   NA 143 12/28/2017 1424   NA 142 11/03/2011   K 4.1 12/28/2017 1424  CL 106 12/28/2017 1424   CO2 28 12/28/2017 1424   GLUCOSE 113 (H) 12/28/2017 1424   BUN 15 12/28/2017 1424   BUN 14 11/03/2011   CREATININE 0.85 12/28/2017 1424   CREATININE 1.04 12/18/2012 1406   CALCIUM 10.0 12/28/2017 1424   PROT 7.3 12/28/2017 1424   PROT 7.0 12/18/2012 1406   ALBUMIN 4.3 12/28/2017 1424   ALBUMIN 3.7 12/18/2012 1406   AST 29 12/28/2017 1424   AST 23 12/18/2012 1406   ALT 21 12/28/2017 1424   ALT 32 12/18/2012 1406   ALKPHOS 61 12/28/2017 1424   ALKPHOS 91 12/18/2012 1406   BILITOT 0.9 12/28/2017 1424   BILITOT 0.6 12/18/2012 1406   GFRNONAA >60 12/28/2017 1424   GFRNONAA 58 (L) 12/18/2012 1406   GFRAA >60 12/28/2017 1424   GFRAA >60 12/18/2012 1406    No results found for: SPEP, UPEP  Lab Results  Component Value Date   WBC 4.9 12/28/2017   NEUTROABS 2.1 12/28/2017   HGB 12.6 12/28/2017   HCT 37.4 12/28/2017   MCV 90.3 12/28/2017   PLT 182 12/28/2017      Chemistry      Component Value Date/Time   NA 143 12/28/2017 1424   NA 142 11/03/2011   K 4.1 12/28/2017 1424   CL 106 12/28/2017 1424   CO2 28 12/28/2017 1424   BUN 15 12/28/2017 1424   BUN 14 11/03/2011   CREATININE 0.85 12/28/2017 1424   CREATININE 1.04 12/18/2012 1406   GLU 95 11/03/2011      Component Value Date/Time   CALCIUM 10.0 12/28/2017 1424   ALKPHOS 61  12/28/2017 1424   ALKPHOS 91 12/18/2012 1406   AST 29 12/28/2017 1424   AST 23 12/18/2012 1406   ALT 21 12/28/2017 1424   ALT 32 12/18/2012 1406   BILITOT 0.9 12/28/2017 1424   BILITOT 0.6 12/18/2012 1406       RADIOGRAPHIC STUDIES: I have personally reviewed the radiological images as listed and agreed with the findings in the report. No results found.   ASSESSMENT & PLAN:  Carcinoma of overlapping sites of left breast in female, estrogen receptor positive ([place]) Left breast Ca- stage I ER/PR positive HER-2/neu negative status post mastectomy currently on adjuvant aromatase inhibitor. STABLE.  # On extended AI therapy [aug 2022]  # BMD- may 2018- Normal; CA=Vit D. STABLE.   # Chronic peripheral neuropathy stable. Continue Lyrica 52m BID- STABLE.   # follow up in 12 months/ labs; mammogram in may 2020.    Orders Placed This Encounter  Procedures  . MM 3D SCREEN BREAST UNI RIGHT    Standing Status:   Future    Standing Expiration Date:   02/28/2019    Order Specific Question:   Reason for Exam (SYMPTOM  OR DIAGNOSIS REQUIRED)    Answer:   Breast cancer    Order Specific Question:   Preferred imaging location?    Answer:   [facility]  . CBC with Differential/Platelet    Standing Status:   Future    Standing Expiration Date:   12/29/2018  . Comprehensive metabolic panel    Standing Status:   Future    Standing Expiration Date:   12/29/2018  . Cancer antigen 27.29    Standing Status:   Future    Standing Expiration Date:   12/29/2018   All questions were answered. The patient knows to call the clinic with any problems, questions or concerns.      GCammie Sickle  MD 12/29/2017 2:52 PM

## 2017-12-29 LAB — CANCER ANTIGEN 27.29: CA 27.29: 10 U/mL (ref 0.0–38.6)

## 2018-01-20 ENCOUNTER — Other Ambulatory Visit: Payer: Self-pay | Admitting: Internal Medicine

## 2018-02-23 ENCOUNTER — Encounter: Payer: Self-pay | Admitting: Internal Medicine

## 2018-02-23 ENCOUNTER — Ambulatory Visit (INDEPENDENT_AMBULATORY_CARE_PROVIDER_SITE_OTHER): Payer: Medicare HMO | Admitting: Internal Medicine

## 2018-02-23 VITALS — BP 116/68 | HR 83 | Temp 97.9°F | Resp 18 | Wt 197.6 lb

## 2018-02-23 DIAGNOSIS — Z9109 Other allergy status, other than to drugs and biological substances: Secondary | ICD-10-CM | POA: Diagnosis not present

## 2018-02-23 DIAGNOSIS — C50812 Malignant neoplasm of overlapping sites of left female breast: Secondary | ICD-10-CM

## 2018-02-23 DIAGNOSIS — E7800 Pure hypercholesterolemia, unspecified: Secondary | ICD-10-CM

## 2018-02-23 DIAGNOSIS — K219 Gastro-esophageal reflux disease without esophagitis: Secondary | ICD-10-CM | POA: Diagnosis not present

## 2018-02-23 DIAGNOSIS — R0789 Other chest pain: Secondary | ICD-10-CM

## 2018-02-23 DIAGNOSIS — Z170 Estrogen receptor positive status [ER+]: Secondary | ICD-10-CM | POA: Diagnosis not present

## 2018-02-23 DIAGNOSIS — R739 Hyperglycemia, unspecified: Secondary | ICD-10-CM

## 2018-02-23 DIAGNOSIS — R079 Chest pain, unspecified: Secondary | ICD-10-CM | POA: Diagnosis not present

## 2018-02-23 LAB — BASIC METABOLIC PANEL
BUN: 15 mg/dL (ref 6–23)
CALCIUM: 9.9 mg/dL (ref 8.4–10.5)
CHLORIDE: 102 meq/L (ref 96–112)
CO2: 30 mEq/L (ref 19–32)
Creatinine, Ser: 0.92 mg/dL (ref 0.40–1.20)
GFR: 78.07 mL/min (ref >60.00)
Glucose, Bld: 97 mg/dL (ref 70–99)
Potassium: 3.8 mEq/L (ref 3.5–5.1)
Sodium: 139 mEq/L (ref 135–145)

## 2018-02-23 LAB — HEMOGLOBIN A1C: Hgb A1c MFr Bld: 5.8 % (ref 4.6–6.5)

## 2018-02-23 LAB — HEPATIC FUNCTION PANEL
ALT: 19 U/L (ref 0–35)
AST: 21 U/L (ref 0–37)
Albumin: 4.4 g/dL (ref 3.5–5.2)
Alkaline Phosphatase: 63 U/L (ref 39–117)
BILIRUBIN TOTAL: 0.6 mg/dL (ref 0.2–1.2)
Bilirubin, Direct: 0.1 mg/dL (ref 0.0–0.3)
Total Protein: 7.3 g/dL (ref 6.0–8.3)

## 2018-02-23 LAB — LIPID PANEL
CHOL/HDL RATIO: 2
Cholesterol: 180 mg/dL (ref 0–200)
HDL: 73.4 mg/dL (ref >39.00)
LDL CALC: 92 mg/dL (ref 0–99)
NONHDL: 106.49
TRIGLYCERIDES: 71 mg/dL (ref 0.0–149.0)
VLDL: 14.2 mg/dL (ref 0.0–40.0)

## 2018-02-23 NOTE — Progress Notes (Signed)
Patient ID: Robin Hill, female   DOB: 04/05/1951, 67 y.o.   MRN: [id number]   Subjective:    Patient ID: Robin Hill, female    DOB: 1950/05/06, 67 y.o.   MRN: [id number]  HPI  Patient here for a scheduled follow up.  Sees Dr Rogue Bussing for f/u of her breast cancer.  Last evaluated 12/28/17.  Stable.  Recommended f/u in one year. She reports she is doing relatively well.  Has felt good.  Did have episodes two weeks ago at [place].  Was sitting.  Noticed chest tightness.  Lasted for a brief period and resolved.  Reports not chest pain or tightness with increased activity or exertion.    No acid reflux.  Breathing stable.  No abdominal pain.  Bowels moving.     Past Medical History:  Diagnosis Date  . Allergic rhinitis   . Allergy   . Asthma   . Breast cancer ([place]) 2012   mastectomy with chemo and rad tx  . Diverticulosis   . Dysmenorrhea   . Hypercholesterolemia    Past Surgical History:  Procedure Laterality Date  . BREAST BIOPSY Right 2012   benign  . BREAST SURGERY  2011   breast cancer  . CESAREAN SECTION  1970  . [place]  . CESAREAN SECTION  1987  . COLONOSCOPY WITH PROPOFOL N/A 10/03/2015   Procedure: COLONOSCOPY WITH PROPOFOL;  Surgeon: Manya Silvas, MD;  Location: [facility] ENDOSCOPY;  Service: Endoscopy;  Laterality: N/A;  . MASTECTOMY Left 2012   with chemo and rad tx  . NASAL SINUS SURGERY  07/2001   Family History  Problem Relation Age of Onset  . Cirrhosis Brother        liver  . Breast cancer Paternal Aunt        2 aunts   Social History   Socioeconomic History  . Marital status: Divorced    Spouse name: Not on file  . Number of children: Not on file  . Years of education: Not on file  . Highest education level: Not on file  Occupational History  . Not on file  Social Needs  . Financial resource strain: Not on file  . Food insecurity:    Worry: Not on file    Inability: Not on file  . Transportation needs:    Medical: Not  on file    Non-medical: Not on file  Tobacco Use  . Smoking status: Former Smoker    Types: Cigarettes  . Smokeless tobacco: Never Used  Substance and Sexual Activity  . Alcohol use: Yes    Alcohol/week: 1.0 standard drinks    Types: 1 Glasses of wine per week    Comment: OCC  . Drug use: No  . Sexual activity: Never  Lifestyle  . Physical activity:    Days per week: Not on file    Minutes per session: Not on file  . Stress: Not on file  Relationships  . Social connections:    Talks on phone: Not on file    Gets together: Not on file    Attends religious service: Not on file    Active member of club or organization: Not on file    Attends meetings of clubs or organizations: Not on file    Relationship status: Not on file  Other Topics Concern  . Not on file  Social History Narrative  . Not on file    Outpatient Encounter Medications as of 02/23/2018  Medication Sig  . Albuterol Sulfate (PROAIR HFA IN) 90 mcg. 2 puffs using inhaler four times a day as needed.  Marland Kitchen anastrozole (ARIMIDEX) 1 MG tablet Take 1 tablet (1 mg total) by mouth daily.  . Ascorbic Acid (VITAMIN C PO) Take by mouth.  Marland Kitchen azelastine (ASTELIN) 0.1 % nasal spray Place 2 sprays into both nostrils 2 (two) times daily. 137 mcg. Spray 2 sprays into both nostrils twice a day (Patient not taking: Reported on 12/28/2017)  . b complex vitamins capsule Take 1 capsule by mouth daily.  . cholecalciferol (VITAMIN D) 400 UNITS TABS Take 400 Units by mouth daily. Chewable  . Cyanocobalamin (VITAMIN B12 PO) Take 500 mcg by mouth daily. Administer 1 tablet by mouth once a day  . fluticasone (FLONASE) 50 MCG/ACT nasal spray Use 1 spray into each nostril twice a day  . levalbuterol (XOPENEX HFA) 45 MCG/ACT inhaler Inhale 2 puffs four times a day as needed  . montelukast (SINGULAIR) 10 MG tablet Take 1 tablet (10 mg total) by mouth at bedtime. 1 tablet once a day  . omeprazole (PRILOSEC) 10 MG capsule Take 1 capsule (10 mg total)  by mouth daily.  . pregabalin (LYRICA) 75 MG capsule Take 2 capsules (150 mg total) by mouth 2 (two) times daily.  . rosuvastatin (CRESTOR) 10 MG tablet TAKE 1 TABLET EVERY DAY  . sodium chloride (OCEAN) 0.65 % nasal spray Place 1 spray into the nose as needed for congestion.  . SYMBICORT 160-4.5 MCG/ACT inhaler INHALE 2 PUFFS INTO THE  LUNGS 2 (TWO)TIMES DAILY  . vitamin E 400 UNIT capsule Take 400 Units by mouth daily.   No facility-administered encounter medications on file as of 02/23/2018.     Review of Systems  Constitutional: Negative for appetite change and unexpected weight change.  HENT: Negative for congestion and sinus pressure.   Respiratory: Negative for cough and shortness of breath.   Cardiovascular: Negative for palpitations and leg swelling.       Describes previous chest tightness as outlined.    Gastrointestinal: Negative for abdominal pain, diarrhea, nausea and vomiting.  Genitourinary: Negative for difficulty urinating and dysuria.  Musculoskeletal: Negative for joint swelling and myalgias.  Skin: Negative for color change and rash.  Neurological: Negative for dizziness, light-headedness and headaches.  Psychiatric/Behavioral: Negative for agitation and dysphoric mood.       Objective:    Physical Exam  Constitutional: She appears well-developed and well-nourished. No distress.  HENT:  Nose: Nose normal.  Mouth/Throat: Oropharynx is clear and moist.  Neck: Neck supple. No thyromegaly present.  Cardiovascular: Normal rate and regular rhythm.  Pulmonary/Chest: Breath sounds normal. No respiratory distress. She has no wheezes.  Abdominal: Soft. Bowel sounds are normal. There is no tenderness.  Musculoskeletal: She exhibits no edema or tenderness.  Lymphadenopathy:    She has no cervical adenopathy.  Skin: No rash noted. No erythema.  Psychiatric: She has a normal mood and affect. Her behavior is normal.    BP 116/68 (BP Location: Left Arm, Patient  Position: Sitting, Cuff Size: Large)   Pulse 83   Temp 97.9 F (36.6 C) (Oral)   Resp 18   Wt 197 lb 9.6 oz (89.6 kg)   SpO2 96%   BMI 33.92 kg/m  Wt Readings from Last 3 Encounters:  02/23/18 197 lb 9.6 oz (89.6 kg)  12/28/17 194 lb (88 kg)  08/24/17 194 lb 9.6 oz (88.3 kg)     Lab Results  Component Value Date  WBC 4.9 12/28/2017   HGB 12.6 12/28/2017   HCT 37.4 12/28/2017   PLT 182 12/28/2017   GLUCOSE 97 02/23/2018   CHOL 180 02/23/2018   TRIG 71.0 02/23/2018   HDL 73.40 02/23/2018   LDLDIRECT 126.2 05/09/2013   LDLCALC 92 02/23/2018   ALT 19 02/23/2018   AST 21 02/23/2018   NA 139 02/23/2018   K 3.8 02/23/2018   CL 102 02/23/2018   CREATININE 0.92 02/23/2018   BUN 15 02/23/2018   CO2 30 02/23/2018   TSH 2.40 09/01/2017   HGBA1C 5.8 02/23/2018   MICROALBUR <0.7 09/09/2014    Mm Screening Breast Tomo Uni R  Result Date: 09/09/2017 CLINICAL DATA:  Screening. EXAM: DIGITAL SCREENING UNILATERAL RIGHT MAMMOGRAM WITH CAD AND TOMO COMPARISON:  Previous exam(s). ACR Breast Density Category b: There are scattered areas of fibroglandular density. FINDINGS: The patient has had a left mastectomy. There are no findings suspicious for malignancy. Images were processed with CAD. IMPRESSION: No mammographic evidence of malignancy. A result letter of this screening mammogram will be mailed directly to the patient. RECOMMENDATION: Screening mammogram in one year.  (Code:SM-R-36M) BI-RADS CATEGORY  1: Negative. Electronically Signed   By: Dorise Bullion III M.D   On: 09/09/2017 09:11       Assessment & Plan:   Problem List Items Addressed This Visit    Carcinoma of overlapping sites of left breast in female, estrogen receptor positive ([place])    Seeing oncology.  On arimidex.  Having some hot flashes.  Overall doing well.  Follow.        Chest tightness    Chest tightness as outlined.  EKG - SR with no acute ischemic changes.  Discussed further w/up.  States she does not have  symptoms with increased activity or exertion.  Wants to monitor.  Will notify me if she changes her mind or if any symptom change.        Environmental allergies    Controlled on current regimen.  Follow.        GERD (gastroesophageal reflux disease)    Controlled on current regimen.  Follow.        Hypercholesterolemia    On crestor.  Low cholesterol diet and exercise.  Follow lipid panel and liver function tests.        Relevant Orders   Hepatic function panel (Completed)   Lipid panel (Completed)   Basic metabolic panel (Completed)   Hyperglycemia    Low carb diet and exercise.  Follow met b and a1c.        Relevant Orders   Hemoglobin A1c (Completed)    Other Visit Diagnoses    Chest pain, unspecified type    -  Primary   Relevant Orders   EKG 12-Lead (Completed)       Einar Pheasant, MD

## 2018-02-24 ENCOUNTER — Encounter: Payer: Self-pay | Admitting: Internal Medicine

## 2018-02-26 ENCOUNTER — Encounter: Payer: Self-pay | Admitting: Internal Medicine

## 2018-02-26 DIAGNOSIS — R0789 Other chest pain: Secondary | ICD-10-CM | POA: Insufficient documentation

## 2018-02-26 NOTE — Assessment & Plan Note (Signed)
Low carb diet and exercise.  Follow met b and a1c.   

## 2018-02-26 NOTE — Assessment & Plan Note (Signed)
On crestor.  Low cholesterol diet and exercise.  Follow lipid panel and liver function tests.   

## 2018-02-26 NOTE — Assessment & Plan Note (Signed)
Seeing oncology.  On arimidex.  Having some hot flashes.  Overall doing well.  Follow.

## 2018-02-26 NOTE — Assessment & Plan Note (Signed)
Controlled on current regimen.  Follow.  

## 2018-02-26 NOTE — Assessment & Plan Note (Signed)
Chest tightness as outlined.  EKG - SR with no acute ischemic changes.  Discussed further w/up.  States she does not have symptoms with increased activity or exertion.  Wants to monitor.  Will notify me if she changes her mind or if any symptom change.

## 2018-03-07 ENCOUNTER — Other Ambulatory Visit: Payer: Self-pay | Admitting: Internal Medicine

## 2018-03-07 MED ORDER — OMEPRAZOLE 10 MG PO CPDR: 10.0000 mg | DELAYED_RELEASE_CAPSULE | Freq: Every day | ORAL | 1 refills | Status: DC

## 2018-03-07 NOTE — Telephone Encounter (Signed)
90 caps and 1 courtesy refill given. Requested Prescriptions  Pending Prescriptions Disp Refills  . omeprazole (PRILOSEC) 10 MG capsule 90 capsule 1    Sig: Take 1 capsule (10 mg total) by mouth daily.     Gastroenterology: Proton Pump Inhibitors Passed - 03/07/2018 10:02 AM      Passed - Valid encounter within last 12 months    Recent Outpatient Visits          1 week ago Chest pain, unspecified type   [facility] [place], MD   6 months ago Screening for cervical cancer   [place], Randell Patient, MD   8 months ago Acute pain of right shoulder   [place], Gregary Signs, FNP   8 months ago Acute bronchitis, unspecified organism   [place], Yvetta Coder, [place]   1 year ago Malignant neoplasm of left female breast, unspecified estrogen receptor status, unspecified site of breast [facility])   [facility] Einar Pheasant, MD      Future Appointments            In 3 months O[facility]Blaney, Bryson Corona, [place], [place]   In 3 months Einar Pheasant, MD [facility], [place]

## 2018-03-07 NOTE — Telephone Encounter (Signed)
Copied from [place] [id number]. Topic: Quick Communication - Rx Refill/Question >> Mar 07, 2018  9:48 AM Cecelia Byars, NT wrote: Medication: omeprazole (PRILOSEC) 10 MG capsule , the pharmacy says they ae faxed a request twice for a refill   Has the patient contacted their pharmacy? yes  (Agent: If no, request that the patient contact the pharmacy for the refill.  (Agent: If yes, when and what did the pharmacy advise?  Preferred Pharmacy (with phone number or street name [place], [place] [phone] (Phone) [phone] (Fax)    Agent: Please be advised that RX refills may take up to 3 business days. We ask that you follow-up with your pharmacy.

## 2018-03-10 ENCOUNTER — Other Ambulatory Visit: Payer: Self-pay | Admitting: Internal Medicine

## 2018-03-28 ENCOUNTER — Encounter: Payer: Self-pay | Admitting: Family Medicine

## 2018-03-28 ENCOUNTER — Ambulatory Visit (INDEPENDENT_AMBULATORY_CARE_PROVIDER_SITE_OTHER): Payer: Medicare HMO | Admitting: Family Medicine

## 2018-03-28 ENCOUNTER — Other Ambulatory Visit: Payer: Self-pay

## 2018-03-28 VITALS — BP 122/78 | HR 88 | Temp 98.2°F | Ht 64.0 in | Wt 203.4 lb

## 2018-03-28 DIAGNOSIS — R05 Cough: Secondary | ICD-10-CM | POA: Diagnosis not present

## 2018-03-28 DIAGNOSIS — J040 Acute laryngitis: Secondary | ICD-10-CM | POA: Diagnosis not present

## 2018-03-28 DIAGNOSIS — J069 Acute upper respiratory infection, unspecified: Secondary | ICD-10-CM | POA: Diagnosis not present

## 2018-03-28 DIAGNOSIS — R0981 Nasal congestion: Secondary | ICD-10-CM | POA: Diagnosis not present

## 2018-03-28 DIAGNOSIS — R059 Cough, unspecified: Secondary | ICD-10-CM

## 2018-03-28 MED ORDER — METHYLPREDNISOLONE 4 MG PO TBPK: ORAL_TABLET | ORAL | 0 refills | Status: DC

## 2018-03-28 NOTE — Patient Instructions (Signed)
Continue to take Mucinex to help calm cough and thin secretions, also continues nasal spray and doing salt water gargles as you have been doing.  May use inhaler as needed for shortness.  We will do steroid taper to calm inflammation in throat/improve loss of voice

## 2018-03-28 NOTE — Progress Notes (Signed)
Subjective:    Patient ID: Robin Hill, female    DOB: 1950/12/04, 67 y.o.   MRN: [id number]  HPI  Presents to clinic c/o loss of voice and throat pain for 2-3 days.  Patient states she recently was on a cruise, and while on the cruise there was an event that she was having a lot of fun with her friends that, they were yelling and dancing.  Patient states the next morning she woke up her voice was gone her throat felt very scratchy.  Patient now also reports cough, nasal congestion.  She has been using Mucinex over-the-counter which has helped somewhat to calm her cough and also uses nasal spray.  She does have an inhaler at home to use if needed for any shortness of breath or wheezing, has not needed to use yet.  Patient denies any fever or chills.  Denies nausea, vomiting or diarrhea.  Denies any chest pain or wheezing.  Patient Active Problem List   Diagnosis Date Noted  . Chest tightness 02/26/2018  . Shoulder pain, right 07/07/2017  . Carcinoma of overlapping sites of left breast in female, estrogen receptor positive ([place]) 12/26/2015  . History of colonic polyps 10/08/2015  . Head pain 03/02/2015  . Health care maintenance 09/08/2014  . Hyperglycemia 09/08/2014  . Acute bronchitis 07/31/2014  . Airway hyperreactivity 07/31/2014  . BMI 33.0-33.9,adult 10/27/2013  . Nail fungus 05/16/2013  . Laryngitis 04/02/2013  . Hypercholesterolemia 10/02/2012  . GERD (gastroesophageal reflux disease) 10/02/2012  . Environmental allergies 10/02/2012  . Diverticulosis 10/02/2012   Social History   Tobacco Use  . Smoking status: Former Smoker    Types: Cigarettes  . Smokeless tobacco: Never Used  Substance Use Topics  . Alcohol use: Yes    Alcohol/week: 1.0 standard drinks    Types: 1 Glasses of wine per week    Comment: OCC    Review of Systems  Constitutional: Negative for chills, fatigue and fever.  HENT: +sore throat and loss of voice, nasal congestion  Eyes: Negative.     Respiratory: +cough. Negative for shortness of breath and wheezing.   Cardiovascular: Negative for chest pain, palpitations and leg swelling.  Gastrointestinal: Negative for abdominal pain, diarrhea, nausea and vomiting.  Genitourinary: Negative for dysuria, frequency and urgency.  Musculoskeletal: Negative for arthralgias and myalgias.  Skin: Negative for color change, pallor and rash.  Neurological: Negative for syncope, light-headedness and headaches.  Psychiatric/Behavioral: The patient is not nervous/anxious.       Objective:   Physical Exam  Constitutional:  Non-toxic appearance. She does not appear ill.  HENT:  Head: Normocephalic and atraumatic.  Right Ear: Tympanic membrane and ear canal normal.  Left Ear: Tympanic membrane and ear canal normal.  Mouth/Throat: Uvula is midline and oropharynx is clear and moist. No tonsillar abscesses. No tonsillar exudate.  +post nasal drip and clear nasal drainage. +scratchy voice.   Eyes: Pupils are equal, round, and reactive to light. EOM are normal.  Neck: Normal range of motion. Neck supple.  Cardiovascular: Normal rate and normal heart sounds.  Pulmonary/Chest: Effort normal and breath sounds normal. No respiratory distress. She has no wheezes. She has no rhonchi. She has no rales.  Neurological: She is alert.  Skin: Skin is warm and dry. No pallor.  Psychiatric: She has a normal mood and affect. Her behavior is normal.  Nursing note and vitals reviewed.     Vitals:   03/28/18 1522  BP: 122/78  Pulse: 88  Temp: 98.2  F (36.8 C)  SpO2: 96%   Assessment & Plan:   Laryngitis, cough, nasal congestion, viral URI- patient will take steroid taper to help improve laryngitis.  Patient advised she continue to take Mucinex to help calm cough and thin secretions and also continue her nasal spray.  Also advised she can keep doing salt water gargles, drinking warm beverages to help soothe her throat.  Patient already has an inhaler on board  to use if needed for any shortness of breath.  Keep regular follow-up with PCP as planned.  Return to clinic sooner if any issues arise or current symptoms persist or worsen.

## 2018-03-29 ENCOUNTER — Encounter: Payer: Self-pay | Admitting: Family Medicine

## 2018-05-09 ENCOUNTER — Telehealth: Payer: Self-pay | Admitting: Pharmacy Technician

## 2018-05-09 NOTE — Telephone Encounter (Signed)
Oral Oncology Patient Advocate Encounter  Patient dropped off completed application for [place] Patient Assistance in an effort to reduce patient's out of pocket expense for Lyrica to $0.    Application completed and faxed to [phone]   [employer] patient assistance phone number for follow up is [phone].  Spoke to patient and told her if I received any information regarding her application I would call her.  [place] Patient [place] Phone [phone] Fax [phone] 05/09/2018 3:38 PM

## 2018-05-10 ENCOUNTER — Other Ambulatory Visit: Payer: Self-pay | Admitting: *Deleted

## 2018-05-10 DIAGNOSIS — G629 Polyneuropathy, unspecified: Secondary | ICD-10-CM

## 2018-05-10 MED ORDER — PREGABALIN 75 MG PO CAPS: 150.0000 mg | ORAL_CAPSULE | Freq: Two times a day (BID) | ORAL | 4 refills | Status: DC

## 2018-05-10 NOTE — Telephone Encounter (Signed)
Received a fax from [employer] [place] [employer] [facility] needing additional information: HIPAA form and new prescription for Lyrica.  Faxed HIPPA form to [employer] [facility] [employer] to [phone].   Robin Hill [place] Phone [phone] Fax [phone] 05/10/2018 12:01 PM

## 2018-05-11 NOTE — Telephone Encounter (Signed)
Faxed script to [employer]

## 2018-05-19 ENCOUNTER — Telehealth: Payer: Self-pay | Admitting: Pharmacy Technician

## 2018-05-19 NOTE — Telephone Encounter (Signed)
Oral Oncology Patient Advocate Encounter  Called to check the status of application and was notified that the patient has been successfully re-enrolled in the [place] Patient Assistance program to receive Lyrica from the manufacturer at $0 out of pocket until 05/03/2019.    She knows we will have to re-apply.   [place] Patient [place] Phone [phone] Fax [phone] 05/19/2018 8:56 AM

## 2018-05-19 NOTE — Telephone Encounter (Signed)
Oral Oncology Patient Advocate Encounter  Met patient in [place] to complete application for [place] Patient Assistance Program in an effort to reduce patient's out of pocket expense for Lyrica to $0.    Application completed and faxed to [phone] on 05/11/2018.   [employer] patient assistance phone number for follow up is [phone].

## 2018-06-05 ENCOUNTER — Ambulatory Visit (INDEPENDENT_AMBULATORY_CARE_PROVIDER_SITE_OTHER): Payer: Medicare Other

## 2018-06-05 ENCOUNTER — Encounter: Payer: Self-pay | Admitting: Internal Medicine

## 2018-06-05 ENCOUNTER — Ambulatory Visit (INDEPENDENT_AMBULATORY_CARE_PROVIDER_SITE_OTHER): Payer: Medicare Other | Admitting: Internal Medicine

## 2018-06-05 VITALS — BP 118/70 | HR 79 | Temp 97.8°F | Resp 14 | Ht 63.75 in | Wt 200.4 lb

## 2018-06-05 DIAGNOSIS — Z6834 Body mass index (BMI) 34.0-34.9, adult: Secondary | ICD-10-CM | POA: Diagnosis not present

## 2018-06-05 DIAGNOSIS — Z9109 Other allergy status, other than to drugs and biological substances: Secondary | ICD-10-CM | POA: Diagnosis not present

## 2018-06-05 DIAGNOSIS — K219 Gastro-esophageal reflux disease without esophagitis: Secondary | ICD-10-CM | POA: Diagnosis not present

## 2018-06-05 DIAGNOSIS — R739 Hyperglycemia, unspecified: Secondary | ICD-10-CM

## 2018-06-05 DIAGNOSIS — E7800 Pure hypercholesterolemia, unspecified: Secondary | ICD-10-CM

## 2018-06-05 DIAGNOSIS — Z0000 Encounter for general adult medical examination without abnormal findings: Secondary | ICD-10-CM | POA: Diagnosis not present

## 2018-06-05 DIAGNOSIS — C50812 Malignant neoplasm of overlapping sites of left female breast: Secondary | ICD-10-CM

## 2018-06-05 DIAGNOSIS — Z170 Estrogen receptor positive status [ER+]: Secondary | ICD-10-CM

## 2018-06-05 NOTE — Patient Instructions (Addendum)
  Robin Hill , Thank you for taking time to come for your Medicare Wellness Visit. I appreciate your ongoing commitment to your health goals. Please review the following plan we discussed and let me know if I can assist you in the future.   These are the goals we discussed: Goals      Patient Stated   . Weight (lb) < 200 lb (90.7 kg) (pt-stated)       This is a list of the screening recommended for you and due dates:  Health Maintenance  Topic Date Due  . Mammogram  09/09/2018  . Pneumonia vaccines (2 of 2 - PPSV23) 02/24/2020  . Colon Cancer Screening  10/02/2025  . Tetanus Vaccine  12/08/2025  . Flu Shot  Completed  . DEXA scan (bone density measurement)  Completed  .  Hepatitis C: One time screening is recommended by Center for Disease Control  (CDC) for  adults born from [birth year] through 1965.   Completed

## 2018-06-05 NOTE — Progress Notes (Signed)
Subjective:   Robin Hill is a 68 y.o. female who presents for Medicare Annual (Subsequent) preventive examination.  Review of Systems:  No ROS.  Medicare Wellness Visit. Additional risk factors are reflected in the social history. Cardiac Risk Factors include: advanced age (>15men, >36 women)     Objective:     Vitals: BP 118/70 (BP Location: Right Arm, Patient Position: Sitting, Cuff Size: Normal)   Pulse 79   Temp 97.8 F (36.6 C) (Oral)   Resp 14   Ht 5' 3.75" (1.619 m)   Wt 200 lb 6.4 oz (90.9 kg)   SpO2 97%   BMI 34.67 kg/m   Body mass index is 34.67 kg/m.  Advanced Directives 06/05/2018 12/28/2017 06/02/2017 12/27/2016 06/28/2016 05/28/2016 12/26/2015  Does Patient Have a Medical Advance Directive? No Yes Yes Yes Yes Yes Yes  Type of Advance Directive - Living will;Healthcare Power of [place];Living will [place];Living will Healthcare Power of [place];Living will [place]  Does patient want to make changes to medical advance directive? - No - Patient declined No - Patient declined - - No - Patient declined No - Patient declined  Copy of [place] in Chart? - Yes Yes - Yes Yes No - copy requested  Would patient like information on creating a medical advance directive? No - Patient declined - - - - - No - patient declined information    Tobacco Social History   Tobacco Use  Smoking Status Former Smoker  . Types: Cigarettes  Smokeless Tobacco Never Used     Counseling given: Not Answered   Clinical Intake:           Diabetes: No  How often do you need to have someone help you when you read instructions, pamphlets, or other written materials from your doctor or pharmacy?: 1 - Never  Interpreter Needed?: No     Past Medical History:  Diagnosis Date  . Allergic rhinitis   . Allergy   . Asthma   . Breast cancer ([place]) 2012   mastectomy  with chemo and rad tx  . Diverticulosis   . Dysmenorrhea   . Hypercholesterolemia    Past Surgical History:  Procedure Laterality Date  . BREAST BIOPSY Right 2012   benign  . BREAST SURGERY  2011   breast cancer  . CESAREAN SECTION  1970  . [place]  . CESAREAN SECTION  1987  . COLONOSCOPY WITH PROPOFOL N/A 10/03/2015   Procedure: COLONOSCOPY WITH PROPOFOL;  Surgeon: Manya Silvas, MD;  [facility]COPY;  Service: Endoscopy;  Laterality: N/A;  . MASTECTOMY Left 2012   with chemo and rad tx  . NASAL SINUS SURGERY  07/2001   Family History  Problem Relation Age of Onset  . Cirrhosis Brother        liver  . Breast cancer Paternal Aunt        2 aunts   Social History   Socioeconomic History  . Marital status: Divorced    Spouse name: Not on file  . Number of children: Not on file  . Years of education: Not on file  . Highest education level: Not on file  Occupational History  . Not on file  Social Needs  . Financial resource strain: Not hard at all  . Food insecurity:    Worry: Never true    Inability: Never true  . Transportation needs:  Medical: No    Non-medical: No  Tobacco Use  . Smoking status: Former Smoker    Types: Cigarettes  . Smokeless tobacco: Never Used  Substance and Sexual Activity  . Alcohol use: Yes    Alcohol/week: 1.0 standard drinks    Types: 1 Glasses of wine per week    Comment: OCC  . Drug use: No  . Sexual activity: Never  Lifestyle  . Physical activity:    Days per week: Not on file    Minutes per session: Not on file  . Stress: Not at all  Relationships  . Social connections:    Talks on phone: Not on file    Gets together: Not on file    Attends religious service: Not on file    Active member of club or organization: Not on file    Attends meetings of clubs or organizations: Not on file    Relationship status: Not on file  Other Topics Concern  . Not on file  Social History Narrative  . Not on  file    Outpatient Encounter Medications as of 06/05/2018  Medication Sig  . Albuterol Sulfate (PROAIR HFA IN) 90 mcg. 2 puffs using inhaler four times a day as needed.  Marland Kitchen anastrozole (ARIMIDEX) 1 MG tablet Take 1 tablet (1 mg total) by mouth daily.  . Ascorbic Acid (VITAMIN C PO) Take by mouth.  Marland Kitchen azelastine (ASTELIN) 0.1 % nasal spray Place 2 sprays into both nostrils 2 (two) times daily. 137 mcg. Spray 2 sprays into both nostrils twice a day  . b complex vitamins capsule Take 1 capsule by mouth daily.  . cholecalciferol (VITAMIN D) 400 UNITS TABS Take 400 Units by mouth daily. Chewable  . Cyanocobalamin (VITAMIN B12 PO) Take 500 mcg by mouth daily. Administer 1 tablet by mouth once a day  . fluticasone (FLONASE) 50 MCG/ACT nasal spray Use 1 spray into each nostril twice a day  . levalbuterol (XOPENEX HFA) 45 MCG/ACT inhaler Inhale 2 puffs four times a day as needed  . methylPREDNISolone (MEDROL DOSEPAK) 4 MG TBPK tablet Take according to pack instructions  . montelukast (SINGULAIR) 10 MG tablet Take 1 tablet (10 mg total) by mouth at bedtime. 1 tablet once a day  . omeprazole (PRILOSEC) 10 MG capsule TAKE 1 CAPSULE EVERY DAY  . pregabalin (LYRICA) 75 MG capsule Take 2 capsules (150 mg total) by mouth 2 (two) times daily.  . rosuvastatin (CRESTOR) 10 MG tablet TAKE 1 TABLET EVERY DAY  . sodium chloride (OCEAN) 0.65 % nasal spray Place 1 spray into the nose as needed for congestion.  . SYMBICORT 160-4.5 MCG/ACT inhaler INHALE 2 PUFFS INTO THE  LUNGS 2 (TWO)TIMES DAILY  . vitamin E 400 UNIT capsule Take 400 Units by mouth daily.   No facility-administered encounter medications on file as of 06/05/2018.     Activities of Daily Living In your present state of health, do you have any difficulty performing the following activities: 06/05/2018  Hearing? N  Vision? N  Difficulty concentrating or making decisions? N  Walking or climbing stairs? N  Dressing or bathing? N  Doing errands, shopping?  N  Preparing Food and eating ? N  Using the Toilet? N  In the past six months, have you accidently leaked urine? N  Do you have problems with loss of bowel control? N  Managing your Medications? N  Managing your Finances? N  Housekeeping or managing your Housekeeping? N  Some recent data might be hidden  Patient Care Team: Einar Pheasant, MD as PCP - General (Internal Medicine)    Assessment:   This is a routine wellness examination for [place].  .Health Screenings  Mammogram -09/11/18 Colonoscopy -10/03/15 Bone Density -12/20/16 Glaucoma -none reported Hearing -demonstrates normal hearing during conversation Hemoglobin A1C -02/23/18 (5.8) Cholesterol 02/23/18 (180) TSH- 09/01/17 (2.40) Dental- every 6 months Vision- every 12 months  Social  Alcohol intake -yes, 3-4 glasses of wine per week Smoking history- former Smokers in home? none Illicit drug use? none Exercise -water aerobics, yoga, swimming Diet -diverticulosis friendly. Intermittent fasting.  Sexually Active- never  Safety  Patient feels safe at home.  Patient does have smoke detectors at home  Patient does wear sunscreen or protective clothing when in direct sunlight  Patient does wear seat belt when driving or riding with others.   Activities of Daily Living Patient can do their own household chores. Denies needing assistance with: driving, feeding themselves, getting from bed to chair, getting to the toilet, bathing/showering, dressing, managing money, climbing flight of stairs, or preparing meals.   Depression Screen Patient denies losing interest in daily life, feeling hopeless, or crying easily over simple problems.   Fall Screen Patient denies being afraid of falling or falling in the last year.   Memory Screen Patient denies problems with memory, misplacing items, and is able to balance checkbook/bank accounts.  Patient is alert, normal appearance, oriented to person/place/and time. Correctly  identified the president of the [place], recall of 2/3 objects, and performing simple calculations.  Patient displays appropriate judgement and can read correct time from watch face.   Immunizations The following Immunizations are up to date: Influenza, shingles, pneumonia, and tetanus.   Other Providers Patient Care Team: Einar Pheasant, MD as PCP - General (Internal Medicine)  Exercise Activities and Dietary recommendations Current Exercise Habits: Home exercise routine, Type of exercise: yoga(water aerobics), Time (Minutes): > 60, Frequency (Times/Week): 6, Weekly Exercise (Minutes/Week): 0, Intensity: Mild  Goals      Patient Stated   . Weight (lb) < 200 lb (90.7 kg) (pt-stated)       Fall Risk Fall Risk  06/05/2018 06/02/2017 05/28/2016 02/17/2016 09/04/2015  Falls in the past year? 0 No No No No   Depression Screen PHQ 2/9 Scores 06/05/2018 06/02/2017 05/28/2016 02/17/2016  PHQ - 2 Score 0 0 0 0     Cognitive Function MMSE - Mini Mental State Exam 06/05/2018 06/02/2017  Orientation to time 5 5  Orientation to Place 5 5  Registration 3 3  Attention/ Calculation 4 5  Recall 2 3  Language- name 2 objects 2 2  Language- repeat 1 1  Language- follow 3 step command 3 3  Language- read & follow direction 1 1  Write a sentence 1 1  Copy design 1 1  Total score 28 30     6CIT Screen 05/28/2016  What Year? 0 points  What month? 0 points  What time? 0 points  Count back from 20 0 points  Months in reverse 0 points  Repeat phrase 0 points  Total Score 0    Immunization History  Administered Date(s) Administered  . Influenza Split 02/22/2014  . Influenza Whole 02/17/2017  . Influenza, High Dose Seasonal PF 01/10/2018  . Influenza-Unspecified 02/09/2013, 02/24/2015, 12/09/2015  . Pneumococcal Conjugate-13 06/05/2016  . Pneumococcal Polysaccharide-23 02/24/2015  . Tdap 12/09/2015  . Zoster Recombinat (Shingrix) 08/13/2016   Screening Tests Health Maintenance  Topic Date Due    . MAMMOGRAM  09/09/2018  .  PNA vac Low Risk Adult (2 of 2 - PPSV23) 02/24/2020  . COLONOSCOPY  10/02/2025  . TETANUS/TDAP  12/08/2025  . INFLUENZA VACCINE  Completed  . DEXA SCAN  Completed  . Hepatitis C Screening  Completed      Plan:   End of life planning; Advanced aging; Advanced directives discussed.  No HCPOA/Living Will.  Additional information declined at this time.  I have personally reviewed and noted the following in the patient's chart:   . Medical and social history . Use of alcohol, tobacco or illicit drugs  . Current medications and supplements . Functional ability and status . Nutritional status . Physical activity . Advanced directives . List of other physicians . Hospitalizations, surgeries, and ER visits in previous 12 months . Vitals . Screenings to include cognitive, depression, and falls . Referrals and appointments  In addition, I have reviewed and discussed with patient certain preventive protocols, quality metrics, and best practice recommendations. A written personalized care plan for preventive services as well as general preventive health recommendations were provided to patient.     Varney Biles, LPN  0/12/6759   Reviewed above information.  Agree with assessment and plan   Dr Nicki Reaper

## 2018-06-05 NOTE — Progress Notes (Signed)
Patient ID: Robin Hill, female   DOB: 11/23/1950, 68 y.o.   MRN: [id number]   Subjective:    Patient ID: Robin Hill, female    DOB: [birth year], 68 y.o.   MRN: [id number]  HPI  Patient here for a scheduled follow up.  She reports she is doing relatively well.  Tries to stay active.  No chest pain.  No sob. No cough or congestion.  No acid reflux.  No abdominal pain. Bowels moving.  Overall she feels she is doing relatively well.     Past Medical History:  Diagnosis Date  . Allergic rhinitis   . Allergy   . Asthma   . Breast cancer ([place]) 2012   mastectomy with chemo and rad tx  . Diverticulosis   . Dysmenorrhea   . Hypercholesterolemia    Past Surgical History:  Procedure Laterality Date  . BREAST BIOPSY Right 2012   benign  . BREAST SURGERY  2011   breast cancer  . CESAREAN SECTION  1970  . [place]  . CESAREAN SECTION  1987  . COLONOSCOPY WITH PROPOFOL N/A 10/03/2015   Procedure: COLONOSCOPY WITH PROPOFOL;  Surgeon: Manya Silvas, MD;  Location: [facility] ENDOSCOPY;  Service: Endoscopy;  Laterality: N/A;  . MASTECTOMY Left 2012   with chemo and rad tx  . NASAL SINUS SURGERY  07/2001   Family History  Problem Relation Age of Onset  . Cirrhosis Brother        liver  . Breast cancer Paternal Aunt        2 aunts   Social History   Socioeconomic History  . Marital status: Divorced    Spouse name: Not on file  . Number of children: Not on file  . Years of education: Not on file  . Highest education level: Not on file  Occupational History  . Not on file  Social Needs  . Financial resource strain: Not hard at all  . Food insecurity:    Worry: Never true    Inability: Never true  . Transportation needs:    Medical: No    Non-medical: No  Tobacco Use  . Smoking status: Former Smoker    Types: Cigarettes  . Smokeless tobacco: Never Used  Substance and Sexual Activity  . Alcohol use: Yes    Alcohol/week: 1.0 standard drinks    Types: 1  Glasses of wine per week    Comment: OCC  . Drug use: No  . Sexual activity: Never  Lifestyle  . Physical activity:    Days per week: Not on file    Minutes per session: Not on file  . Stress: Not at all  Relationships  . Social connections:    Talks on phone: Not on file    Gets together: Not on file    Attends religious service: Not on file    Active member of club or organization: Not on file    Attends meetings of clubs or organizations: Not on file    Relationship status: Not on file  Other Topics Concern  . Not on file  Social History Narrative  . Not on file    Outpatient Encounter Medications as of 06/05/2018  Medication Sig  . Albuterol Sulfate (PROAIR HFA IN) 90 mcg. 2 puffs using inhaler four times a day as needed.  Marland Kitchen anastrozole (ARIMIDEX) 1 MG tablet Take 1 tablet (1 mg total) by mouth daily.  . Ascorbic Acid (VITAMIN C PO) Take by mouth.  Marland Kitchen  azelastine (ASTELIN) 0.1 % nasal spray Place 2 sprays into both nostrils 2 (two) times daily. 137 mcg. Spray 2 sprays into both nostrils twice a day  . b complex vitamins capsule Take 1 capsule by mouth daily.  . cholecalciferol (VITAMIN D) 400 UNITS TABS Take 400 Units by mouth daily. Chewable  . Cyanocobalamin (VITAMIN B12 PO) Take 500 mcg by mouth daily. Administer 1 tablet by mouth once a day  . fluticasone (FLONASE) 50 MCG/ACT nasal spray Use 1 spray into each nostril twice a day  . levalbuterol (XOPENEX HFA) 45 MCG/ACT inhaler Inhale 2 puffs four times a day as needed  . methylPREDNISolone (MEDROL DOSEPAK) 4 MG TBPK tablet Take according to pack instructions  . montelukast (SINGULAIR) 10 MG tablet Take 1 tablet (10 mg total) by mouth at bedtime. 1 tablet once a day  . omeprazole (PRILOSEC) 10 MG capsule TAKE 1 CAPSULE EVERY DAY  . pregabalin (LYRICA) 75 MG capsule Take 2 capsules (150 mg total) by mouth 2 (two) times daily.  . rosuvastatin (CRESTOR) 10 MG tablet TAKE 1 TABLET EVERY DAY  . sodium chloride (OCEAN) 0.65 %  nasal spray Place 1 spray into the nose as needed for congestion.  . SYMBICORT 160-4.5 MCG/ACT inhaler INHALE 2 PUFFS INTO THE  LUNGS 2 (TWO)TIMES DAILY  . vitamin E 400 UNIT capsule Take 400 Units by mouth daily.   No facility-administered encounter medications on file as of 06/05/2018.     Review of Systems  Constitutional: Negative for appetite change and unexpected weight change.  HENT: Negative for congestion and sinus pressure.   Respiratory: Negative for cough, chest tightness and shortness of breath.   Cardiovascular: Negative for chest pain, palpitations and leg swelling.  Gastrointestinal: Negative for abdominal pain, diarrhea, nausea and vomiting.  Genitourinary: Negative for difficulty urinating and dysuria.  Musculoskeletal: Negative for joint swelling and myalgias.  Skin: Negative for color change and rash.  Neurological: Negative for dizziness, light-headedness and headaches.  Psychiatric/Behavioral: Negative for agitation and dysphoric mood.       Objective:    Physical Exam Constitutional:      General: She is not in acute distress.    Appearance: Normal appearance.  HENT:     Nose: Nose normal. No congestion.     Mouth/Throat:     Pharynx: No oropharyngeal exudate or posterior oropharyngeal erythema.  Neck:     Musculoskeletal: Neck supple. No muscular tenderness.     Thyroid: No thyromegaly.  Cardiovascular:     Rate and Rhythm: Normal rate and regular rhythm.  Pulmonary:     Effort: No respiratory distress.     Breath sounds: Normal breath sounds. No wheezing.  Abdominal:     General: Bowel sounds are normal.     Palpations: Abdomen is soft.     Tenderness: There is no abdominal tenderness.  Musculoskeletal:        General: No swelling or tenderness.  Lymphadenopathy:     Cervical: No cervical adenopathy.  Skin:    Findings: No erythema or rash.  Neurological:     Mental Status: She is alert.  Psychiatric:        Mood and Affect: Mood normal.          Behavior: Behavior normal.     BP 118/70 (BP Location: Right Arm, Patient Position: Sitting, Cuff Size: Normal)   Pulse 79   Temp 97.8 F (36.6 C) (Oral)   Resp 14   Wt 200 lb 6.4 oz (90.9 kg)     SpO2 97%   BMI 34.67 kg/m  Wt Readings from Last 3 Encounters:  06/05/18 200 lb 6.4 oz (90.9 kg)  06/05/18 200 lb 6.4 oz (90.9 kg)  03/28/18 203 lb 6.4 oz (92.3 kg)     Lab Results  Component Value Date   WBC 4.9 12/28/2017   HGB 12.6 12/28/2017   HCT 37.4 12/28/2017   PLT 182 12/28/2017   GLUCOSE 97 02/23/2018   CHOL 180 02/23/2018   TRIG 71.0 02/23/2018   HDL 73.40 02/23/2018   LDLDIRECT 126.2 05/09/2013   LDLCALC 92 02/23/2018   ALT 19 02/23/2018   AST 21 02/23/2018   NA 139 02/23/2018   K 3.8 02/23/2018   CL 102 02/23/2018   CREATININE 0.92 02/23/2018   BUN 15 02/23/2018   CO2 30 02/23/2018   TSH 2.40 09/01/2017   HGBA1C 5.8 02/23/2018   MICROALBUR <0.7 09/09/2014    Mm Screening Breast Tomo Uni R  Result Date: 09/09/2017 CLINICAL DATA:  Screening. EXAM: DIGITAL SCREENING UNILATERAL RIGHT MAMMOGRAM WITH CAD AND TOMO COMPARISON:  Previous exam(s). ACR Breast Density Category b: There are scattered areas of fibroglandular density. FINDINGS: The patient has had a left mastectomy. There are no findings suspicious for malignancy. Images were processed with CAD. IMPRESSION: No mammographic evidence of malignancy. A result letter of this screening mammogram will be mailed directly to the patient. RECOMMENDATION: Screening mammogram in one year.  (Code:SM-R-12M) BI-RADS CATEGORY  1: Negative. Electronically Signed   By: David  Williams III M.D   On: 09/09/2017 09:11       Assessment & Plan:   Problem List Items Addressed This Visit    BMI 34.0-34.9,adult    Diet and exercise.  Follow.        Carcinoma of overlapping sites of left breast in female, estrogen receptor positive (HCC)    Seeing oncology.  On arimidex.  Doing well.  Follow.        Environmental  allergies    Controlled on current regimen.  Follow.        GERD (gastroesophageal reflux disease)    Controlled on current regimen.  Follow.       Hypercholesterolemia    On crestor.  Low cholesterol diet and exercise.  Follow lipid panel and liver function tests.        Relevant Orders   Hepatic function panel   Lipid panel   Basic metabolic panel   Hyperglycemia    Low carb diet and exercise.  Follow met b and a1c.        Relevant Orders   Hemoglobin A1c       Charlene Scott, MD  

## 2018-06-11 ENCOUNTER — Encounter: Payer: Self-pay | Admitting: Internal Medicine

## 2018-06-11 NOTE — Assessment & Plan Note (Signed)
Controlled on current regimen.  Follow.  

## 2018-06-11 NOTE — Assessment & Plan Note (Signed)
Low carb diet and exercise.  Follow met b and a1c.   

## 2018-06-11 NOTE — Assessment & Plan Note (Signed)
Diet and exercise.  Follow.  

## 2018-06-11 NOTE — Assessment & Plan Note (Signed)
On crestor.  Low cholesterol diet and exercise.  Follow lipid panel and liver function tests.   

## 2018-06-11 NOTE — Assessment & Plan Note (Signed)
Seeing oncology.  On arimidex.  Doing well.  Follow.   

## 2018-06-12 ENCOUNTER — Other Ambulatory Visit (INDEPENDENT_AMBULATORY_CARE_PROVIDER_SITE_OTHER): Payer: Medicare Other

## 2018-06-12 DIAGNOSIS — R739 Hyperglycemia, unspecified: Secondary | ICD-10-CM | POA: Diagnosis not present

## 2018-06-12 DIAGNOSIS — E7800 Pure hypercholesterolemia, unspecified: Secondary | ICD-10-CM | POA: Diagnosis not present

## 2018-06-12 LAB — LIPID PANEL
Cholesterol: 198 mg/dL (ref 0–200)
HDL: 76.4 mg/dL (ref >39.00)
LDL Cholesterol: 107 mg/dL — ABNORMAL HIGH (ref 0–99)
NonHDL: 121.77
Total CHOL/HDL Ratio: 3
Triglycerides: 76 mg/dL (ref 0.0–149.0)
VLDL: 15.2 mg/dL (ref 0.0–40.0)

## 2018-06-12 LAB — BASIC METABOLIC PANEL
BUN: 14 mg/dL (ref 6–23)
CHLORIDE: 102 meq/L (ref 96–112)
CO2: 29 mEq/L (ref 19–32)
Calcium: 10.5 mg/dL (ref 8.4–10.5)
Creatinine, Ser: 0.85 mg/dL (ref 0.40–1.20)
GFR: 80.41 mL/min (ref >60.00)
GLUCOSE: 102 mg/dL — AB (ref 70–99)
POTASSIUM: 3.9 meq/L (ref 3.5–5.1)
Sodium: 139 mEq/L (ref 135–145)

## 2018-06-12 LAB — HEPATIC FUNCTION PANEL
ALK PHOS: 67 U/L (ref 39–117)
ALT: 18 U/L (ref 0–35)
AST: 23 U/L (ref 0–37)
Albumin: 4.2 g/dL (ref 3.5–5.2)
Bilirubin, Direct: 0.1 mg/dL (ref 0.0–0.3)
Total Bilirubin: 0.6 mg/dL (ref 0.2–1.2)
Total Protein: 7.2 g/dL (ref 6.0–8.3)

## 2018-06-12 LAB — HEMOGLOBIN A1C: Hgb A1c MFr Bld: 5.9 % (ref 4.6–6.5)

## 2018-06-14 ENCOUNTER — Other Ambulatory Visit: Payer: Self-pay

## 2018-06-14 ENCOUNTER — Telehealth: Payer: Self-pay | Admitting: Internal Medicine

## 2018-06-14 NOTE — Telephone Encounter (Signed)
2/12 8:34am pt called back for lab results - please call back on her cell # [phone].  Copied from [place]. Topic: Quick Communication - Lab Results (Clinic Use ONLY) >> Jun 13, 2018  4:57 PM Lars Masson, LPN wrote: Called patient to inform them of lab results. When patient returns call, triage nurse may disclose results.

## 2018-06-14 NOTE — Telephone Encounter (Signed)
Pt calling to say that whoever gave her lab results asked what pharmacy she was using.  Pt is using [facility] for mail order medication.

## 2018-06-21 ENCOUNTER — Other Ambulatory Visit: Payer: Medicare Other

## 2018-06-22 ENCOUNTER — Other Ambulatory Visit: Payer: Self-pay | Admitting: Internal Medicine

## 2018-06-28 ENCOUNTER — Telehealth: Payer: Self-pay | Admitting: Internal Medicine

## 2018-06-28 NOTE — Telephone Encounter (Signed)
Copied from [place] [id number]. Topic: Quick Communication - Rx Refill/Question >> Jun 28, 2018  3:58 PM Sheran Luz wrote: Medication: rosuvastatin (CRESTOR) 10 MG tablet   Patient is requesting refill of this medication.   Preferred Pharmacy (with phone number or street name): [place], [place] [employer] [facility]  [phone] (Phone) [phone] (Fax)

## 2018-06-29 ENCOUNTER — Other Ambulatory Visit: Payer: Self-pay

## 2018-06-29 MED ORDER — ROSUVASTATIN CALCIUM 10 MG PO TABS: 10.0000 mg | ORAL_TABLET | Freq: Every day | ORAL | 1 refills | Status: DC

## 2018-06-29 NOTE — Telephone Encounter (Signed)
rx sent in 

## 2018-06-29 NOTE — Telephone Encounter (Signed)
Left msg for pt.

## 2018-07-03 ENCOUNTER — Telehealth: Payer: Self-pay | Admitting: Internal Medicine

## 2018-07-03 NOTE — Telephone Encounter (Signed)
Called patient back.  Patient stated that she is having left shoulder pain.  Denies having any tingling sensation.  Stated it was not that kind of pain.  Patient stated that pain started over a month ago and that Dr. Nicki Reaper was made aware at her last visit and referred her to an orthopedic doctor. Pt said that she saw the orthopedic surgeon a couple of times but now the pain is back.  Informed patient that since her referral to otho is current that she can call Dr. Rudene Christians directly at [facility] to schedule an appt.

## 2018-07-03 NOTE — Telephone Encounter (Signed)
Copied from [place] [phone]. Topic: Quick Communication - See Telephone Encounter >> Jul 03, 2018  2:47 PM Robina Ade, Helene Kelp D wrote: CRM for notification. See Telephone encounter for: 07/03/18. Patient called and would like to let Dr. Nicki Reaper know that her left arm keeps hurting and that she can lift it up. Patient would like to talk to Dr. Nicki Reaper or her CMA. Please call patient back, thanks.

## 2018-07-05 DIAGNOSIS — M25512 Pain in left shoulder: Secondary | ICD-10-CM | POA: Diagnosis not present

## 2018-07-17 DIAGNOSIS — M25512 Pain in left shoulder: Secondary | ICD-10-CM | POA: Diagnosis not present

## 2018-07-21 ENCOUNTER — Telehealth: Payer: Self-pay | Admitting: *Deleted

## 2018-07-21 MED ORDER — ANASTROZOLE 1 MG PO TABS: 1.0000 mg | ORAL_TABLET | Freq: Every day | ORAL | 3 refills | Status: DC

## 2018-07-21 NOTE — Telephone Encounter (Signed)
Incoming fax requesting rf renewal on anastrozole.

## 2018-07-31 ENCOUNTER — Other Ambulatory Visit: Payer: Self-pay | Admitting: *Deleted

## 2018-07-31 MED ORDER — ANASTROZOLE 1 MG PO TABS: 1.0000 mg | ORAL_TABLET | Freq: Every day | ORAL | 3 refills | Status: DC

## 2018-07-31 NOTE — Addendum Note (Signed)
Addended by: Betti Cruz on: 07/31/2018 01:50 PM   Modules accepted: Orders

## 2018-08-18 ENCOUNTER — Other Ambulatory Visit: Payer: Self-pay | Admitting: *Deleted

## 2018-08-18 DIAGNOSIS — G629 Polyneuropathy, unspecified: Secondary | ICD-10-CM

## 2018-08-18 MED ORDER — PREGABALIN 75 MG PO CAPS: 150.0000 mg | ORAL_CAPSULE | Freq: Two times a day (BID) | ORAL | 6 refills | Status: DC

## 2018-09-04 ENCOUNTER — Telehealth: Payer: Self-pay | Admitting: Internal Medicine

## 2018-09-04 MED ORDER — ROSUVASTATIN CALCIUM 20 MG PO TABS: 20.0000 mg | ORAL_TABLET | Freq: Every day | ORAL | 1 refills | Status: DC

## 2018-09-04 NOTE — Telephone Encounter (Signed)
Copied from [place] [phone]. Topic: Quick Communication - See Telephone Encounter >> Sep 04, 2018  9:33 AM Loma Boston wrote: CRM for notification. See Telephone encounter for: 09/04/18.rosuvastatin (CRESTOR) 10 MG tablet  [place], [place] [employer] [phone] (Phone) [phone] (Fax) On her last visit, last blood work this script was upped to 20mg  vs 10mg . The refill was still for 10mg  so she was just taking two instead of the one. She is now due for a refill but due to taking the (2) instead of (1) she is running out but pharmacy says that she is not ready for refill. She needs her script to be called in at 20 mg at the increase amt if she understood Dr Nicki Reaper correctly. If Dr Nicki Reaper has any questions call her [phone]. PT is worried about getting out of her system and running out so she has backed it back down to 10 mg.till refill. Please advise if different

## 2018-09-04 NOTE — Telephone Encounter (Signed)
Called Pt and told her that her Rx of Crestor was increased to 20mg  and sent to her [place]. Pt stated Thank you so much.

## 2018-09-04 NOTE — Telephone Encounter (Signed)
Per review of last lab result note in 06/2018, we increased her crestor to 20mg  q day.  Per note, she had wanted to hold on having rx sent in until she got the name of her new mail order.  I sent in rx for crestor 20mg  q day to [place].  Please notify pt.  Thanks.

## 2018-09-15 ENCOUNTER — Telehealth: Payer: Self-pay | Admitting: Internal Medicine

## 2018-09-15 NOTE — Telephone Encounter (Signed)
Copied from [place] [phone]. Topic: Quick Communication - Rx Refill/Question >> Sep 15, 2018  9:02 AM Rayann Heman wrote: Medication: SYMBICORT 160-4.5 MCG/ACT inhaler [[id number]]  Has the patient contacted their pharmacy? no Preferred Pharmacy (with phone number or street name): [place], [place] [phone] (Phone) [phone] (Fax)    Agent: Please be advised that RX refills may take up to 3 business days. We ask that you follow-up with your pharmacy.

## 2018-09-15 NOTE — Telephone Encounter (Signed)
Last refilled in 2018 with 3 refills. Okay to refill. NOV: 10/06/18

## 2018-09-16 MED ORDER — BUDESONIDE-FORMOTEROL FUMARATE 160-4.5 MCG/ACT IN AERO: INHALATION_SPRAY | RESPIRATORY_TRACT | 0 refills | Status: DC

## 2018-09-16 NOTE — Telephone Encounter (Signed)
rx for symbicort (3 months) no refills. Sent to [facility].

## 2018-10-06 ENCOUNTER — Other Ambulatory Visit: Payer: Self-pay

## 2018-10-06 ENCOUNTER — Encounter: Payer: Self-pay | Admitting: Internal Medicine

## 2018-10-06 ENCOUNTER — Ambulatory Visit (INDEPENDENT_AMBULATORY_CARE_PROVIDER_SITE_OTHER): Payer: Medicare Other | Admitting: Internal Medicine

## 2018-10-06 DIAGNOSIS — C50812 Malignant neoplasm of overlapping sites of left female breast: Secondary | ICD-10-CM

## 2018-10-06 DIAGNOSIS — Z9109 Other allergy status, other than to drugs and biological substances: Secondary | ICD-10-CM

## 2018-10-06 DIAGNOSIS — E785 Hyperlipidemia, unspecified: Secondary | ICD-10-CM | POA: Diagnosis not present

## 2018-10-06 DIAGNOSIS — K219 Gastro-esophageal reflux disease without esophagitis: Secondary | ICD-10-CM

## 2018-10-06 DIAGNOSIS — J4521 Mild intermittent asthma with (acute) exacerbation: Secondary | ICD-10-CM

## 2018-10-06 DIAGNOSIS — R739 Hyperglycemia, unspecified: Secondary | ICD-10-CM

## 2018-10-06 DIAGNOSIS — L539 Erythematous condition, unspecified: Secondary | ICD-10-CM

## 2018-10-06 DIAGNOSIS — Z170 Estrogen receptor positive status [ER+]: Secondary | ICD-10-CM

## 2018-10-06 MED ORDER — TRIAMCINOLONE ACETONIDE 0.1 % EX CREA: 1.0000 "application " | TOPICAL_CREAM | Freq: Two times a day (BID) | CUTANEOUS | 0 refills | Status: DC

## 2018-10-06 MED ORDER — DOXYCYCLINE HYCLATE 100 MG PO TABS: 100.0000 mg | ORAL_TABLET | Freq: Two times a day (BID) | ORAL | 0 refills | Status: DC

## 2018-10-06 MED ORDER — ALIGN 4 MG PO CAPS: ORAL_CAPSULE | ORAL | 0 refills | Status: DC

## 2018-10-06 NOTE — Progress Notes (Signed)
Patient ID: Robin Hill, female   DOB: 1951/03/31, 68 y.o.   MRN: [id number]   Virtual Visit via video Note  This visit type was conducted due to national recommendations for restrictions regarding the COVID-19 pandemic (e.g. social distancing).  This format is felt to be most appropriate for this patient at this time.  All issues noted in this document were discussed and addressed.  No physical exam was performed (except for noted visual exam findings with Video Visits).   I connected with Iantha Fallen by a video enabled telemedicine application or telephone and verified that I am speaking with the correct person using two identifiers. Location patient: home Location provider: work  Persons participating in the virtual visit: patient, provider  I discussed the limitations, risks, security and privacy concerns of performing an evaluation and management service by video and the availability of in person appointments.  The patient expressed understanding and agreed to proceed.   Reason for visit: scheduled follow up.   HPI: She reports she is doing relatively well.  Seeing oncology for f/u breast cancer.  On arimidex.  Tolerating.  Trying to stay active.  No chest pain. No sob.  No acid reflux.  No abdominal pain. Bowels moving.  She has been trying to stay in due to covid restrictions.  Two of her relatives have COVID.  She has not been around them.  They [place] of state.  No fever. No sob, chest congestion or cough.  She has noticed left lower leg - bruise.  Initially described as a bruise, but states today has noticed some increased redness and localized soft tissue swelling.  No itching.  No known bite. No rash.     ROS: See pertinent positives and negatives per HPI.  Past Medical History:  Diagnosis Date  . Allergic rhinitis   . Allergy   . Asthma   . Breast cancer ([place]) 2012   mastectomy with chemo and rad tx  . Diverticulosis   . Dysmenorrhea   . Hypercholesterolemia      Past Surgical History:  Procedure Laterality Date  . BREAST BIOPSY Right 2012   benign  . BREAST SURGERY  2011   breast cancer  . CESAREAN SECTION  1970  . [place]  . CESAREAN SECTION  1987  . COLONOSCOPY WITH PROPOFOL N/A 10/03/2015   Procedure: COLONOSCOPY WITH PROPOFOL;  Surgeon: Manya Silvas, MD;  Location: [facility] ENDOSCOPY;  Service: Endoscopy;  Laterality: N/A;  . MASTECTOMY Left 2012   with chemo and rad tx  . NASAL SINUS SURGERY  07/2001    Family History  Problem Relation Age of Onset  . Cirrhosis Brother        liver  . Breast cancer Paternal Aunt        2 aunts    SOCIAL HX: reviewed.    Current Outpatient Medications:  .  Albuterol Sulfate (PROAIR HFA IN), 90 mcg. 2 puffs using inhaler four times a day as needed., Disp: , Rfl:  .  anastrozole (ARIMIDEX) 1 MG tablet, Take 1 tablet (1 mg total) by mouth daily., Disp: 90 tablet, Rfl: 3 .  Ascorbic Acid (VITAMIN C PO), Take by mouth., Disp: , Rfl:  .  azelastine (ASTELIN) 0.1 % nasal spray, Place 2 sprays into both nostrils 2 (two) times daily. 137 mcg. Spray 2 sprays into both nostrils twice a day, Disp: 90 mL, Rfl: 1 .  b complex vitamins capsule, Take 1 capsule by mouth daily., Disp: ,  Rfl:  .  budesonide-formoterol (SYMBICORT) 160-4.5 MCG/ACT inhaler, INHALE 2 PUFFS INTO THE  LUNGS 2 (TWO)TIMES DAILY, Disp: 30.6 g, Rfl: 0 .  cholecalciferol (VITAMIN D) 400 UNITS TABS, Take 400 Units by mouth daily. Chewable, Disp: , Rfl:  .  Cyanocobalamin (VITAMIN B12 PO), Take 500 mcg by mouth daily. Administer 1 tablet by mouth once a day, Disp: , Rfl:  .  fluticasone (FLONASE) 50 MCG/ACT nasal spray, USE 1 SPRAY INTO EACH  NOSTRIL TWICE A DAY, Disp: 48 g, Rfl: 3 .  levalbuterol (XOPENEX HFA) 45 MCG/ACT inhaler, Inhale 2 puffs four times a day as needed, Disp: , Rfl:  .  montelukast (SINGULAIR) 10 MG tablet, Take 1 tablet (10 mg total) by mouth at bedtime. 1 tablet once a day, Disp: 90 tablet, Rfl: 3 .  omeprazole  (PRILOSEC) 10 MG capsule, TAKE 1 CAPSULE BY MOUTH  DAILY, Disp: 90 capsule, Rfl: 3 .  pregabalin (LYRICA) 75 MG capsule, Take 2 capsules (150 mg total) by mouth 2 (two) times daily., Disp: 360 capsule, Rfl: 6 .  rosuvastatin (CRESTOR) 20 MG tablet, Take 1 tablet (20 mg total) by mouth daily., Disp: 90 tablet, Rfl: 1 .  sodium chloride (OCEAN) 0.65 % nasal spray, Place 1 spray into the nose as needed for congestion., Disp: , Rfl:  .  vitamin E 400 UNIT capsule, Take 400 Units by mouth daily., Disp: , Rfl:  .  doxycycline (VIBRA-TABS) 100 MG tablet, Take 1 tablet (100 mg total) by mouth 2 (two) times daily., Disp: 14 tablet, Rfl: 0 .  Probiotic Product (ALIGN) 4 MG CAPS, Take one tablet daily while you are on the antibiotics and for two weeks after completing the antibiotics., Disp: 30 capsule, Rfl: 0 .  triamcinolone cream (KENALOG) 0.1 %, Apply 1 application topically 2 (two) times daily., Disp: 30 g, Rfl: 0  EXAM:  GENERAL: alert, oriented, appears well and in no acute distress  HEENT: atraumatic, conjunttiva clear, no obvious abnormalities on inspection of external nose and ears  NECK: normal movements of the head and neck  LUNGS: on inspection no signs of respiratory distress, breathing rate appears normal, no obvious gross SOB, gasping or wheezing  CV: no obvious cyanosis  SKIN:  Erythema - lower left leg with minimal soft tissue swelling.  No erythema extending up the leg.  No calf or upper leg swelling.    PSYCH/NEURO: pleasant and cooperative, no obvious depression or anxiety, speech and thought processing grossly intact  ASSESSMENT AND PLAN:  Discussed the following assessment and plan:  Mild intermittent asthma with acute exacerbation  Carcinoma of overlapping sites of left breast in female, estrogen receptor positive ([place])  Environmental allergies  Gastroesophageal reflux disease without esophagitis  Hyperlipidemia, unspecified hyperlipidemia type - Plan: CBC with  Differential/Platelet, TSH, Lipid panel, Hepatic function panel, Basic metabolic panel  Hyperglycemia - Plan: Hemoglobin A1c  Leg erythema  Asthma Breathing stable. Continue inhalers.    Carcinoma of overlapping sites of left breast in female, estrogen receptor positive ([place]) Seeing oncology.  On arimidex.  Doing well.  Follow.    Environmental allergies Controlled on current regimen.    GERD (gastroesophageal reflux disease) Controlled on current regimen.  Follow.   HLD (hyperlipidemia) On crestor.  Low cholesterol diet and exercise.  Follow lipid panel and liver function tests.    Hyperglycemia Low carb diet and exercise.  Follow metb and a1c.   Leg erythema Lower left leg erythema - localized - left ankle.  Minimal soft tissue  swelling.  Treat with doxycycline as directed. Probiotic as directed.  Triamcinolone cream as directed.  Leg elevation.  Follow.  Call with update.     I discussed the assessment and treatment plan with the patient. The patient was provided an opportunity to ask questions and all were answered. The patient agreed with the plan and demonstrated an understanding of the instructions.   The patient was advised to call back or seek an in-person evaluation if the symptoms worsen or if the condition fails to improve as anticipated.    Einar Pheasant, MD

## 2018-10-08 ENCOUNTER — Encounter: Payer: Self-pay | Admitting: Internal Medicine

## 2018-10-08 DIAGNOSIS — L539 Erythematous condition, unspecified: Secondary | ICD-10-CM | POA: Insufficient documentation

## 2018-10-08 NOTE — Assessment & Plan Note (Signed)
Seeing oncology.  On arimidex.  Doing well.  Follow.

## 2018-10-08 NOTE — Assessment & Plan Note (Signed)
Controlled on current regimen.  Follow.  

## 2018-10-08 NOTE — Assessment & Plan Note (Signed)
Breathing stable.  Continue inhalers.   

## 2018-10-08 NOTE — Assessment & Plan Note (Signed)
Lower left leg erythema - localized - left ankle.  Minimal soft tissue swelling.  Treat with doxycycline as directed. Probiotic as directed.  Triamcinolone cream as directed.  Leg elevation.  Follow.  Call with update.

## 2018-10-08 NOTE — Assessment & Plan Note (Signed)
Low carb diet and exercise.  Follow met b and a1c.  

## 2018-10-08 NOTE — Assessment & Plan Note (Signed)
Controlled on current regimen.   

## 2018-10-08 NOTE — Assessment & Plan Note (Signed)
On crestor.  Low cholesterol diet and exercise.  Follow lipid panel and liver function tests.   

## 2018-10-10 ENCOUNTER — Telehealth: Payer: Self-pay

## 2018-10-10 NOTE — Telephone Encounter (Signed)
Copied from [place] [id number]. Topic: General - Other >> Oct 10, 2018  2:31 PM Leward Quan A wrote: Reason for CRM: Patient called to inform Dr Nicki Reaper that she is doing better the swelling is going down and she is taking her medicine.

## 2018-10-10 NOTE — Telephone Encounter (Signed)
Noted.  Keep [place] posted.  Let us know if she needs anything.

## 2018-10-10 NOTE — Telephone Encounter (Signed)
FYI for you. 

## 2018-10-10 NOTE — Telephone Encounter (Signed)
Pt aware.

## 2018-10-25 ENCOUNTER — Other Ambulatory Visit: Payer: Self-pay | Admitting: Internal Medicine

## 2018-10-27 NOTE — Telephone Encounter (Signed)
Pt calling to see if she can get an update on the status of this refill.

## 2018-11-17 ENCOUNTER — Other Ambulatory Visit: Payer: Self-pay

## 2018-11-17 ENCOUNTER — Ambulatory Visit
Admission: RE | Admit: 2018-11-17 | Discharge: 2018-11-17 | Disposition: A | Discharge status: Home / Self Care | Payer: Medicare Other | Source: Ambulatory Visit | Attending: Internal Medicine | Admitting: Internal Medicine

## 2018-11-17 DIAGNOSIS — Z1231 Encounter for screening mammogram for malignant neoplasm of breast: Secondary | ICD-10-CM | POA: Diagnosis not present

## 2018-11-17 DIAGNOSIS — C50812 Malignant neoplasm of overlapping sites of left female breast: Secondary | ICD-10-CM

## 2018-11-17 DIAGNOSIS — Z170 Estrogen receptor positive status [ER+]: Secondary | ICD-10-CM | POA: Insufficient documentation

## 2018-12-28 ENCOUNTER — Other Ambulatory Visit: Payer: Self-pay

## 2018-12-29 ENCOUNTER — Inpatient Hospital Stay: Payer: Medicare Other | Attending: Internal Medicine

## 2018-12-29 ENCOUNTER — Inpatient Hospital Stay (HOSPITAL_BASED_OUTPATIENT_CLINIC_OR_DEPARTMENT_OTHER): Payer: Medicare Other | Admitting: Internal Medicine

## 2018-12-29 ENCOUNTER — Other Ambulatory Visit: Payer: Self-pay

## 2018-12-29 DIAGNOSIS — Z79811 Long term (current) use of aromatase inhibitors: Secondary | ICD-10-CM | POA: Insufficient documentation

## 2018-12-29 DIAGNOSIS — C50812 Malignant neoplasm of overlapping sites of left female breast: Secondary | ICD-10-CM | POA: Diagnosis not present

## 2018-12-29 DIAGNOSIS — G629 Polyneuropathy, unspecified: Secondary | ICD-10-CM | POA: Insufficient documentation

## 2018-12-29 DIAGNOSIS — Z170 Estrogen receptor positive status [ER+]: Secondary | ICD-10-CM

## 2018-12-29 LAB — COMPREHENSIVE METABOLIC PANEL
ALT: 21 U/L (ref 0–44)
AST: 24 U/L (ref 15–41)
Albumin: 4.1 g/dL (ref 3.5–5.0)
Alkaline Phosphatase: 60 U/L (ref 38–126)
Anion gap: 7 (ref 5–15)
BUN: 15 mg/dL (ref 8–23)
CO2: 26 mmol/L (ref 22–32)
Calcium: 9.7 mg/dL (ref 8.9–10.3)
Chloride: 106 mmol/L (ref 98–111)
Creatinine, Ser: 0.89 mg/dL (ref 0.44–1.00)
GFR calc Af Amer: >60 mL/min (ref >60)
GFR calc non Af Amer: >60 mL/min (ref >60)
Glucose, Bld: 127 mg/dL — ABNORMAL HIGH (ref 70–99)
Potassium: 3.8 mmol/L (ref 3.5–5.1)
Sodium: 139 mmol/L (ref 135–145)
Total Bilirubin: 0.7 mg/dL (ref 0.3–1.2)
Total Protein: 7.6 g/dL (ref 6.5–8.1)

## 2018-12-29 LAB — CBC WITH DIFFERENTIAL/PLATELET
Abs Immature Granulocytes: 0.01 10*3/uL (ref 0.00–0.07)
Basophils Absolute: 0 10*3/uL (ref 0.0–0.1)
Basophils Relative: 1 %
Eosinophils Absolute: 0.3 10*3/uL (ref 0.0–0.5)
Eosinophils Relative: 5 %
HCT: 37.3 % (ref 36.0–46.0)
Hemoglobin: 12.6 g/dL (ref 12.0–15.0)
Immature Granulocytes: 0 %
Lymphocytes Relative: 30 %
Lymphs Abs: 1.5 10*3/uL (ref 0.7–4.0)
MCH: 30.4 pg (ref 26.0–34.0)
MCHC: 33.8 g/dL (ref 30.0–36.0)
MCV: 90.1 fL (ref 80.0–100.0)
Monocytes Absolute: 0.4 10*3/uL (ref 0.1–1.0)
Monocytes Relative: 7 %
Neutro Abs: 2.9 10*3/uL (ref 1.7–7.7)
Neutrophils Relative %: 57 %
Platelets: 187 10*3/uL (ref 150–400)
RBC: 4.14 MIL/uL (ref 3.87–5.11)
RDW: 12.1 % (ref 11.5–15.5)
WBC: 5 10*3/uL (ref 4.0–10.5)
nRBC: 0 % (ref 0.0–0.2)

## 2018-12-29 NOTE — Assessment & Plan Note (Addendum)
Left breast Ca- stage I ER/PR positive HER-2/neu negative status post mastectomy currently on adjuvant aromatase inhibitor.  Stable.  Mammogram-July 2020 normal.  # On extended AI therapy [aug 2022].  Continue the same.  # BMD- may 2018- Normal; CA=Vit D.  Stable.  BMD next year.   # Chronic peripheral neuropathy stable. Continue Lyrica 60m BID- Stable.   # DISPOSITION: #  follow up in 12 months- MD / labs-cbc/cmp; mammogram/DEXA scan July 2021.

## 2018-12-29 NOTE — Progress Notes (Signed)
[facility] OFFICE PROGRESS NOTE  Patient Care Team: Einar Pheasant, MD as PCP - General (Internal Medicine)  Cancer Staging No matching staging information was found for the patient.   Oncology History Overview Note  # 2011- LEFT BREAST [Dr.Smith; Dr.Pandit] Stage I IDC with lobular features pT1c (1.9cm) pSNmi-s/p Lumpec & RT; AC- Taxol x10 [sec to PN]; ER/PR-Pos; Her 2 NEU. [facility]- Aug 2017- Low risk of recurrence 1.3%; however- benefit from extended AI [node pos pt]  # 2012- RIGHT BREAST DCIS s/p mastectomy- on AI'  # MAY 2018- BMD- Normal.   # PN- G2- on Lyrica -----------------------------------------------------------------  DIAGNOSIS: LEFT BREAST CA  STAGE:   I      ;GOALS: curative  CURRENT/MOST RECENT THERAPY: Arimidex    Carcinoma of overlapping sites of left breast in female, estrogen receptor positive ([place])      INTERVAL HISTORY:  Robin Hill 68 y.o.  female pleasant patient above history of stage I breast cancer is here for follow-up/on extended AI.  Patient denies any worsening joint pains or bone pain.  Denies any nausea vomiting.  No headaches.  No weight loss.  Review of Systems  Constitutional: Negative for chills, diaphoresis, fever, malaise/fatigue and weight loss.  HENT: Negative for nosebleeds and sore throat.   Eyes: Negative for double vision.  Respiratory: Negative for cough, hemoptysis, sputum production, shortness of breath and wheezing.   Cardiovascular: Negative for chest pain, palpitations, orthopnea and leg swelling.  Gastrointestinal: Negative for abdominal pain, blood in stool, constipation, diarrhea, heartburn, melena, nausea and vomiting.  Genitourinary: Negative for dysuria, frequency and urgency.  Musculoskeletal: Negative for back pain and joint pain.  Skin: Negative.  Negative for itching and rash.  Neurological: Negative for dizziness, tingling, focal weakness, weakness and headaches.  Endo/Heme/Allergies: Does  not bruise/bleed easily.  Psychiatric/Behavioral: Negative for depression. The patient is not nervous/anxious and does not have insomnia.     PAST MEDICAL HISTORY :  Past Medical History:  Diagnosis Date  . Allergic rhinitis   . Allergy   . Asthma   . Breast cancer ([place]) 2012   mastectomy with chemo and rad tx  . Diverticulosis   . Dysmenorrhea   . Hypercholesterolemia     PAST SURGICAL HISTORY :   Past Surgical History:  Procedure Laterality Date  . BREAST BIOPSY Right 2012   benign  . BREAST SURGERY  2011   breast cancer  . CESAREAN SECTION  1970  . [place]  . CESAREAN SECTION  1987  . COLONOSCOPY WITH PROPOFOL N/A 10/03/2015   Procedure: COLONOSCOPY WITH PROPOFOL;  Surgeon: Manya Silvas, MD;  Location: [facility] ENDOSCOPY;  Service: Endoscopy;  Laterality: N/A;  . MASTECTOMY Left 2012   with chemo and rad tx  . NASAL SINUS SURGERY  07/2001    FAMILY HISTORY :   Family History  Problem Relation Age of Onset  . Cirrhosis Brother        liver  . Breast cancer Paternal Aunt        2 aunts    SOCIAL HISTORY:   Social History   Tobacco Use  . Smoking status: Former Smoker    Types: Cigarettes  . Smokeless tobacco: Never Used  Substance Use Topics  . Alcohol use: Yes    Alcohol/week: 1.0 standard drinks    Types: 1 Glasses of wine per week    Comment: OCC  . Drug use: No    ALLERGIES:  has No Known Allergies.  MEDICATIONS:  Current Outpatient Medications  Medication Sig Dispense Refill  . Albuterol Sulfate (PROAIR HFA IN) 90 mcg. 2 puffs using inhaler four times a day as needed.    Marland Kitchen anastrozole (ARIMIDEX) 1 MG tablet Take 1 tablet (1 mg total) by mouth daily. 90 tablet 3  . Ascorbic Acid (VITAMIN C PO) Take by mouth.    Marland Kitchen azelastine (ASTELIN) 0.1 % nasal spray Place 2 sprays into both nostrils 2 (two) times daily. 137 mcg. Spray 2 sprays into both nostrils twice a day 90 mL 1  . b complex vitamins capsule Take 1 capsule by mouth daily.    .  budesonide-formoterol (SYMBICORT) 160-4.5 MCG/ACT inhaler USE 2 INHALATIONS BY MOUTH  TWICE DAILY 30.6 g 3  . cholecalciferol (VITAMIN D) 400 UNITS TABS Take 400 Units by mouth daily. Chewable    . Cyanocobalamin (VITAMIN B12 PO) Take 500 mcg by mouth daily. Administer 1 tablet by mouth once a day    . doxycycline (VIBRA-TABS) 100 MG tablet Take 1 tablet (100 mg total) by mouth 2 (two) times daily. 14 tablet 0  . fluticasone (FLONASE) 50 MCG/ACT nasal spray USE 1 SPRAY INTO EACH  NOSTRIL TWICE A DAY 48 g 3  . levalbuterol (XOPENEX HFA) 45 MCG/ACT inhaler Inhale 2 puffs four times a day as needed    . montelukast (SINGULAIR) 10 MG tablet Take 1 tablet (10 mg total) by mouth at bedtime. 1 tablet once a day 90 tablet 3  . omeprazole (PRILOSEC) 10 MG capsule TAKE 1 CAPSULE BY MOUTH  DAILY 90 capsule 3  . pregabalin (LYRICA) 75 MG capsule Take 2 capsules (150 mg total) by mouth 2 (two) times daily. 360 capsule 6  . Probiotic Product (ALIGN) 4 MG CAPS Take one tablet daily while you are on the antibiotics and for two weeks after completing the antibiotics. 30 capsule 0  . rosuvastatin (CRESTOR) 20 MG tablet Take 1 tablet (20 mg total) by mouth daily. 90 tablet 1  . sodium chloride (OCEAN) 0.65 % nasal spray Place 1 spray into the nose as needed for congestion.    . triamcinolone cream (KENALOG) 0.1 % Apply 1 application topically 2 (two) times daily. 30 g 0  . vitamin E 400 UNIT capsule Take 400 Units by mouth daily.     No current facility-administered medications for this visit.     PHYSICAL EXAMINATION: ECOG PERFORMANCE STATUS: 0 - Asymptomatic  BP 138/85   Pulse 80   Temp (!) 97.4 F (36.3 C) (Tympanic)   Resp 20   Ht 5' 3.75" (1.619 m)   Wt 212 lb (96.2 kg)   BMI 36.68 kg/m   Filed Weights   12/29/18 1330  Weight: 212 lb (96.2 kg)    Physical Exam  Constitutional: She is oriented to person, place, and time and well-developed, well-nourished, and in no distress.  HENT:  Head:  Normocephalic and atraumatic.  Mouth/Throat: Oropharynx is clear and moist. No oropharyngeal exudate.  Eyes: Pupils are equal, round, and reactive to light.  Neck: Normal range of motion. Neck supple.  Cardiovascular: Normal rate and regular rhythm.  Pulmonary/Chest: No respiratory distress. She has no wheezes.  Abdominal: Soft. Bowel sounds are normal. She exhibits no distension and no mass. There is no abdominal tenderness. There is no rebound and no guarding.  Musculoskeletal: Normal range of motion.        General: No tenderness or edema.  Neurological: She is alert and oriented to person, place, and time.  Skin:  Skin is warm.  Right breast exam (in the presence of nurse)- no unusual skin changes or dominant masses felt. Surgical scars noted.  Left-mastectomy noted no lumps or bumps.   Psychiatric: Affect normal.    LABORATORY DATA:  I have reviewed the data as listed    Component Value Date/Time   NA 139 12/29/2018 1300   NA 142 11/03/2011   K 3.8 12/29/2018 1300   CL 106 12/29/2018 1300   CO2 26 12/29/2018 1300   GLUCOSE 127 (H) 12/29/2018 1300   BUN 15 12/29/2018 1300   BUN 14 11/03/2011   CREATININE 0.89 12/29/2018 1300   CREATININE 1.04 12/18/2012 1406   CALCIUM 9.7 12/29/2018 1300   PROT 7.6 12/29/2018 1300   PROT 7.0 12/18/2012 1406   ALBUMIN 4.1 12/29/2018 1300   ALBUMIN 3.7 12/18/2012 1406   AST 24 12/29/2018 1300   AST 23 12/18/2012 1406   ALT 21 12/29/2018 1300   ALT 32 12/18/2012 1406   ALKPHOS 60 12/29/2018 1300   ALKPHOS 91 12/18/2012 1406   BILITOT 0.7 12/29/2018 1300   BILITOT 0.6 12/18/2012 1406   GFRNONAA >60 12/29/2018 1300   GFRNONAA 58 (L) 12/18/2012 1406   GFRAA >60 12/29/2018 1300   GFRAA >60 12/18/2012 1406    No results found for: SPEP, UPEP  Lab Results  Component Value Date   WBC 5.0 12/29/2018   NEUTROABS 2.9 12/29/2018   HGB 12.6 12/29/2018   HCT 37.3 12/29/2018   MCV 90.1 12/29/2018   PLT 187 12/29/2018      Chemistry       Component Value Date/Time   NA 139 12/29/2018 1300   NA 142 11/03/2011   K 3.8 12/29/2018 1300   CL 106 12/29/2018 1300   CO2 26 12/29/2018 1300   BUN 15 12/29/2018 1300   BUN 14 11/03/2011   CREATININE 0.89 12/29/2018 1300   CREATININE 1.04 12/18/2012 1406   GLU 95 11/03/2011      Component Value Date/Time   CALCIUM 9.7 12/29/2018 1300   ALKPHOS 60 12/29/2018 1300   ALKPHOS 91 12/18/2012 1406   AST 24 12/29/2018 1300   AST 23 12/18/2012 1406   ALT 21 12/29/2018 1300   ALT 32 12/18/2012 1406   BILITOT 0.7 12/29/2018 1300   BILITOT 0.6 12/18/2012 1406       RADIOGRAPHIC STUDIES: I have personally reviewed the radiological images as listed and agreed with the findings in the report. No results found.   ASSESSMENT & PLAN:  Carcinoma of overlapping sites of left breast in female, estrogen receptor positive ([place]) Left breast Ca- stage I ER/PR positive HER-2/neu negative status post mastectomy currently on adjuvant aromatase inhibitor.  Stable.  Mammogram-July 2020 normal.  # On extended AI therapy [aug 2022].  Continue the same.  # BMD- may 2018- Normal; CA=Vit D.  Stable.  BMD next year.   # Chronic peripheral neuropathy stable. Continue Lyrica 23m BID- Stable.   # DISPOSITION: #  follow up in 12 months- MD / labs-cbc/cmp; mammogram/DEXA scan July 2021.    No orders of the defined types were placed in this encounter.  All questions were answered. The patient knows to call the clinic with any problems, questions or concerns.      GCammie Sickle MD 12/29/2018 2:01 PM

## 2018-12-30 LAB — CANCER ANTIGEN 27.29: CA 27.29: 5.7 U/mL (ref 0.0–38.6)

## 2019-01-12 ENCOUNTER — Telehealth: Payer: Self-pay

## 2019-01-12 ENCOUNTER — Other Ambulatory Visit: Payer: Self-pay | Admitting: Internal Medicine

## 2019-01-12 NOTE — Telephone Encounter (Signed)
Copied from [place] [id number]. Topic: General - Other >> Jan 12, 2019 10:47 AM Mathis Bud wrote: Reason for CRM: Patient wanted PCP to know she did get her shingles shot and flu shot. Patient call back # [phone]

## 2019-01-17 NOTE — Telephone Encounter (Signed)
Chart updated

## 2019-01-30 DIAGNOSIS — C50912 Malignant neoplasm of unspecified site of left female breast: Secondary | ICD-10-CM | POA: Diagnosis not present

## 2019-02-28 ENCOUNTER — Other Ambulatory Visit: Payer: Self-pay | Admitting: *Deleted

## 2019-02-28 DIAGNOSIS — G629 Polyneuropathy, unspecified: Secondary | ICD-10-CM

## 2019-02-28 MED ORDER — PREGABALIN 75 MG PO CAPS: 150.0000 mg | ORAL_CAPSULE | Freq: Two times a day (BID) | ORAL | 11 refills | Status: DC

## 2019-03-07 ENCOUNTER — Telehealth: Payer: Self-pay | Admitting: *Deleted

## 2019-03-07 NOTE — Telephone Encounter (Signed)
Call returned. I explained to the patient that the [place] forms were completed and faxed to [place] for her lyrica patient assistance. I also mailed her copy of all the forms yesterday.  She states that she wants a new script for lyrica 100 mgs 1 tablet twice daily for a dose change. She is finding that she is not needing to use the Lyrica 75 mg 2 tablets twice daily as much. She feels changing the dose would help her. I explained to her that [facility] already has the scripts for the 75 mg dosing of lyrica. If Dr. Jacinto Reap is agreeable the application would have to be refaxed with the new dosing script.  Dr. B please advise.  If agreeable, I will see if Sonia Baller, NP will sign new lyrica script on your behalf.

## 2019-03-07 NOTE — Telephone Encounter (Signed)
-----   Message from Betti Cruz, RN sent at 03/07/2019 10:28 AM EST ----- Regarding: paperwork Patient called asking for Nira Conn to call her back regarding the papers she left for you and she wants to know if it was done and sent or not. Please return her call [phone]

## 2019-04-03 ENCOUNTER — Other Ambulatory Visit: Payer: Self-pay | Admitting: Internal Medicine

## 2019-04-11 ENCOUNTER — Other Ambulatory Visit: Payer: Self-pay | Admitting: *Deleted

## 2019-04-11 ENCOUNTER — Other Ambulatory Visit: Payer: Self-pay | Admitting: Internal Medicine

## 2019-04-11 DIAGNOSIS — G629 Polyneuropathy, unspecified: Secondary | ICD-10-CM

## 2019-04-11 MED ORDER — PREGABALIN 75 MG PO CAPS: 150.0000 mg | ORAL_CAPSULE | Freq: Two times a day (BID) | ORAL | 11 refills | Status: DC

## 2019-04-12 ENCOUNTER — Other Ambulatory Visit: Payer: Self-pay | Admitting: Nurse Practitioner

## 2019-04-12 DIAGNOSIS — G629 Polyneuropathy, unspecified: Secondary | ICD-10-CM

## 2019-04-12 MED ORDER — PREGABALIN 75 MG PO CAPS: 150.0000 mg | ORAL_CAPSULE | Freq: Two times a day (BID) | ORAL | 4 refills | Status: DC

## 2019-04-12 MED ORDER — PREGABALIN 75 MG PO CAPS: 150.0000 mg | ORAL_CAPSULE | Freq: Two times a day (BID) | ORAL | 3 refills | Status: DC

## 2019-04-12 NOTE — Telephone Encounter (Signed)
The prescription yesterday was sent to the wrong pharmacy. I called [facility] and cancelled the prescription sent yesterday

## 2019-04-16 ENCOUNTER — Telehealth: Payer: Self-pay | Admitting: *Deleted

## 2019-04-16 DIAGNOSIS — G629 Polyneuropathy, unspecified: Secondary | ICD-10-CM

## 2019-04-16 NOTE — Telephone Encounter (Signed)
Patient called stating prescription needs to be resent to pharmacy a=with physician signature on it, not NP

## 2019-04-17 MED ORDER — PREGABALIN 75 MG PO CAPS: 150.0000 mg | ORAL_CAPSULE | Freq: Two times a day (BID) | ORAL | 3 refills | Status: DC

## 2019-04-17 NOTE — Telephone Encounter (Signed)
New script for lyrica signed by Dr. Jacinto Reap and faxed to [facility]

## 2019-05-18 DIAGNOSIS — E669 Obesity, unspecified: Secondary | ICD-10-CM | POA: Diagnosis not present

## 2019-05-18 DIAGNOSIS — C50919 Malignant neoplasm of unspecified site of unspecified female breast: Secondary | ICD-10-CM | POA: Diagnosis not present

## 2019-05-18 DIAGNOSIS — E785 Hyperlipidemia, unspecified: Secondary | ICD-10-CM | POA: Diagnosis not present

## 2019-05-18 DIAGNOSIS — I739 Peripheral vascular disease, unspecified: Secondary | ICD-10-CM | POA: Diagnosis not present

## 2019-05-18 DIAGNOSIS — K219 Gastro-esophageal reflux disease without esophagitis: Secondary | ICD-10-CM | POA: Diagnosis not present

## 2019-05-18 DIAGNOSIS — Z79811 Long term (current) use of aromatase inhibitors: Secondary | ICD-10-CM | POA: Diagnosis not present

## 2019-05-18 DIAGNOSIS — J309 Allergic rhinitis, unspecified: Secondary | ICD-10-CM | POA: Diagnosis not present

## 2019-05-18 DIAGNOSIS — Z6833 Body mass index (BMI) 33.0-33.9, adult: Secondary | ICD-10-CM | POA: Diagnosis not present

## 2019-05-18 DIAGNOSIS — Z7951 Long term (current) use of inhaled steroids: Secondary | ICD-10-CM | POA: Diagnosis not present

## 2019-05-18 DIAGNOSIS — J449 Chronic obstructive pulmonary disease, unspecified: Secondary | ICD-10-CM | POA: Diagnosis not present

## 2019-05-28 ENCOUNTER — Other Ambulatory Visit: Payer: Self-pay | Admitting: *Deleted

## 2019-05-28 DIAGNOSIS — Z170 Estrogen receptor positive status [ER+]: Secondary | ICD-10-CM

## 2019-05-28 DIAGNOSIS — C50812 Malignant neoplasm of overlapping sites of left female breast: Secondary | ICD-10-CM

## 2019-06-06 ENCOUNTER — Other Ambulatory Visit: Payer: Self-pay | Admitting: Licensed Clinical Social Worker

## 2019-06-06 DIAGNOSIS — C50812 Malignant neoplasm of overlapping sites of left female breast: Secondary | ICD-10-CM

## 2019-06-08 ENCOUNTER — Other Ambulatory Visit: Payer: Self-pay

## 2019-06-08 ENCOUNTER — Ambulatory Visit (INDEPENDENT_AMBULATORY_CARE_PROVIDER_SITE_OTHER): Payer: Medicare Other | Admitting: Internal Medicine

## 2019-06-08 ENCOUNTER — Ambulatory Visit: Payer: Medicare Other

## 2019-06-08 DIAGNOSIS — J4521 Mild intermittent asthma with (acute) exacerbation: Secondary | ICD-10-CM | POA: Diagnosis not present

## 2019-06-08 DIAGNOSIS — E785 Hyperlipidemia, unspecified: Secondary | ICD-10-CM

## 2019-06-08 DIAGNOSIS — Z170 Estrogen receptor positive status [ER+]: Secondary | ICD-10-CM

## 2019-06-08 DIAGNOSIS — K219 Gastro-esophageal reflux disease without esophagitis: Secondary | ICD-10-CM | POA: Diagnosis not present

## 2019-06-08 DIAGNOSIS — R739 Hyperglycemia, unspecified: Secondary | ICD-10-CM

## 2019-06-08 DIAGNOSIS — C50812 Malignant neoplasm of overlapping sites of left female breast: Secondary | ICD-10-CM

## 2019-06-08 DIAGNOSIS — Z9109 Other allergy status, other than to drugs and biological substances: Secondary | ICD-10-CM

## 2019-06-08 NOTE — Progress Notes (Signed)
Patient ID: Robin Hill, female   DOB: [birth year], 69 y.o.   MRN: [id number]   Virtual Visit via video Note  This visit type was conducted due to national recommendations for restrictions regarding the COVID-19 pandemic (e.g. social distancing).  This format is felt to be most appropriate for this patient at this time.  All issues noted in this document were discussed and addressed.  No physical exam was performed (except for noted visual exam findings with Video Visits).   I connected with Iantha Fallen by a video enabled telemedicine application and verified that I am speaking with the correct person using two identifiers. Location patient: home Location provider: work  Persons participating in the virtual visit: patient, provider  The limitations, risks, security and privacy concerns of performing an evaluation and management service by video and the availability of in person appointments have been discussed.  The patient expressed understanding and agreed to proceed.   Reason for visit: scheduled follow up.   HPI: She reports she is doing relatively well.  Increased stress - dealing with deaths in her family.  Discussed with her.  Overall she feels she is doing relatively well.  Does not feel needs anything more at this time.  Tries to stay active.  No chest pain.  No sob.  No acid reflux.  No abdominal pain.  Bowels moving.  Taking lyrica for neuropathy.  Mammogram 11/2018 - ok.  Seeing oncology.  Last visit - stable.  Recommended f/u in 12 months.     ROS: See pertinent positives and negatives per HPI.  Past Medical History:  Diagnosis Date  . Allergic rhinitis   . Allergy   . Asthma   . Breast cancer ([place]) 2012   mastectomy with chemo and rad tx  . Diverticulosis   . Dysmenorrhea   . Hypercholesterolemia     Past Surgical History:  Procedure Laterality Date  . BREAST BIOPSY Right 2012   benign  . BREAST SURGERY  2011   breast cancer  . CESAREAN SECTION  1970  .  [place]  . CESAREAN SECTION  1987  . COLONOSCOPY WITH PROPOFOL N/A 10/03/2015   Procedure: COLONOSCOPY WITH PROPOFOL;  Surgeon: Manya Silvas, MD;  Location: [facility] ENDOSCOPY;  Service: Endoscopy;  Laterality: N/A;  . MASTECTOMY Left 2012   with chemo and rad tx  . NASAL SINUS SURGERY  07/2001    Family History  Problem Relation Age of Onset  . Cirrhosis Brother        liver  . Breast cancer Paternal Aunt        2 aunts    SOCIAL HX: reviewed.    Current Outpatient Medications:  .  Albuterol Sulfate (PROAIR HFA IN), 90 mcg. 2 puffs using inhaler four times a day as needed., Disp: , Rfl:  .  anastrozole (ARIMIDEX) 1 MG tablet, Take 1 tablet (1 mg total) by mouth daily., Disp: 90 tablet, Rfl: 3 .  Ascorbic Acid (VITAMIN C PO), Take by mouth., Disp: , Rfl:  .  azelastine (ASTELIN) 0.1 % nasal spray, Place 2 sprays into both nostrils 2 (two) times daily. 137 mcg. Spray 2 sprays into both nostrils twice a day, Disp: 90 mL, Rfl: 1 .  b complex vitamins capsule, Take 1 capsule by mouth daily., Disp: , Rfl:  .  budesonide-formoterol (SYMBICORT) 160-4.5 MCG/ACT inhaler, USE 2 INHALATIONS BY MOUTH  TWICE DAILY, Disp: 30.6 g, Rfl: 3 .  cholecalciferol (VITAMIN D) 400 UNITS TABS, Take  400 Units by mouth daily. Chewable, Disp: , Rfl:  .  Cyanocobalamin (VITAMIN B12 PO), Take 500 mcg by mouth daily. Administer 1 tablet by mouth once a day, Disp: , Rfl:  .  doxycycline (VIBRA-TABS) 100 MG tablet, Take 1 tablet (100 mg total) by mouth 2 (two) times daily., Disp: 14 tablet, Rfl: 0 .  fluticasone (FLONASE) 50 MCG/ACT nasal spray, USE 1 SPRAY INTO EACH  NOSTRIL TWICE A DAY, Disp: 48 g, Rfl: 3 .  levalbuterol (XOPENEX HFA) 45 MCG/ACT inhaler, Inhale 2 puffs four times a day as needed, Disp: , Rfl:  .  montelukast (SINGULAIR) 10 MG tablet, Take 1 tablet (10 mg total) by mouth at bedtime. 1 tablet once a day, Disp: 90 tablet, Rfl: 3 .  omeprazole (PRILOSEC) 10 MG capsule, TAKE 1 CAPSULE BY  MOUTH  DAILY, Disp: 90 capsule, Rfl: 3 .  pregabalin (LYRICA) 75 MG capsule, Take 2 capsules (150 mg total) by mouth 2 (two) times daily., Disp: 360 capsule, Rfl: 3 .  Probiotic Product (ALIGN) 4 MG CAPS, Take one tablet daily while you are on the antibiotics and for two weeks after completing the antibiotics., Disp: 30 capsule, Rfl: 0 .  rosuvastatin (CRESTOR) 20 MG tablet, TAKE 1 TABLET BY MOUTH  DAILY, Disp: 90 tablet, Rfl: 3 .  sodium chloride (OCEAN) 0.65 % nasal spray, Place 1 spray into the nose as needed for congestion., Disp: , Rfl:  .  triamcinolone cream (KENALOG) 0.1 %, Apply 1 application topically 2 (two) times daily., Disp: 30 g, Rfl: 0 .  vitamin E 400 UNIT capsule, Take 400 Units by mouth daily., Disp: , Rfl:   EXAM:  GENERAL: alert, oriented, appears well and in no acute distress  HEENT: atraumatic, conjunttiva clear, no obvious abnormalities on inspection of external nose and ears  NECK: normal movements of the head and neck  LUNGS: on inspection no signs of respiratory distress, breathing rate appears normal, no obvious gross SOB, gasping or wheezing  CV: no obvious cyanosis  PSYCH/NEURO: pleasant and cooperative, no obvious depression or anxiety, speech and thought processing grossly intact  ASSESSMENT AND PLAN:  Discussed the following assessment and plan:  Asthma Breathing stable.    Carcinoma of overlapping sites of left breast in female, estrogen receptor positive ([place]) Seeing oncology.  Stable.  Recommended f/u in 12 months.  On armiidex.   Environmental allergies Controlled.    GERD (gastroesophageal reflux disease) Controlled on current regimen.  Follow.    HLD (hyperlipidemia) On crestor.  Low cholesterol diet and exercise  Follow lipid panel and liver function tests.    Hyperglycemia Low carb diet and exercise.  Follow met b and a1c.    No orders of the defined types were placed in this encounter.   No orders of the defined types were  placed in this encounter.    I discussed the assessment and treatment plan with the patient. The patient was provided an opportunity to ask questions and all were answered. The patient agreed with the plan and demonstrated an understanding of the instructions.   The patient was advised to call back or seek an in-person evaluation if the symptoms worsen or if the condition fails to improve as anticipated.   Einar Pheasant, MD

## 2019-06-11 ENCOUNTER — Encounter: Payer: Self-pay | Admitting: Internal Medicine

## 2019-06-11 NOTE — Assessment & Plan Note (Addendum)
Seeing oncology.  Stable.  Recommended f/u in 12 months.  On armiidex.

## 2019-06-11 NOTE — Assessment & Plan Note (Signed)
Controlled.  

## 2019-06-11 NOTE — Assessment & Plan Note (Signed)
On crestor.  Low cholesterol diet and exercise.  Follow lipid panel and liver function tests.   

## 2019-06-11 NOTE — Assessment & Plan Note (Signed)
Controlled on current regimen.  Follow.  

## 2019-06-11 NOTE — Assessment & Plan Note (Signed)
Breathing stable.

## 2019-06-11 NOTE — Assessment & Plan Note (Signed)
Low carb diet and exercise.  Follow met b and a1c.  

## 2019-06-22 ENCOUNTER — Other Ambulatory Visit: Payer: Medicare HMO

## 2019-06-23 ENCOUNTER — Other Ambulatory Visit: Payer: Self-pay | Admitting: Internal Medicine

## 2019-06-29 ENCOUNTER — Other Ambulatory Visit (INDEPENDENT_AMBULATORY_CARE_PROVIDER_SITE_OTHER): Payer: Medicare Other

## 2019-06-29 ENCOUNTER — Other Ambulatory Visit: Payer: Self-pay

## 2019-06-29 DIAGNOSIS — R739 Hyperglycemia, unspecified: Secondary | ICD-10-CM

## 2019-06-29 DIAGNOSIS — E785 Hyperlipidemia, unspecified: Secondary | ICD-10-CM

## 2019-06-29 LAB — CBC WITH DIFFERENTIAL/PLATELET
Basophils Absolute: 0 10*3/uL (ref 0.0–0.1)
Basophils Relative: 0.7 % (ref 0.0–3.0)
Eosinophils Absolute: 0.5 10*3/uL (ref 0.0–0.7)
Eosinophils Relative: 9.9 % — ABNORMAL HIGH (ref 0.0–5.0)
HCT: 37.8 % (ref 36.0–46.0)
Hemoglobin: 12.7 g/dL (ref 12.0–15.0)
Lymphocytes Relative: 32.3 % (ref 12.0–46.0)
Lymphs Abs: 1.6 10*3/uL (ref 0.7–4.0)
MCHC: 33.5 g/dL (ref 30.0–36.0)
MCV: 91.8 fl (ref 78.0–100.0)
Monocytes Absolute: 0.4 10*3/uL (ref 0.1–1.0)
Monocytes Relative: 8.2 % (ref 3.0–12.0)
Neutro Abs: 2.4 10*3/uL (ref 1.4–7.7)
Neutrophils Relative %: 48.9 % (ref 43.0–77.0)
Platelets: 177 10*3/uL (ref 150.0–400.0)
RBC: 4.12 Mil/uL (ref 3.87–5.11)
RDW: 13.1 % (ref 11.5–15.5)
WBC: 4.9 10*3/uL (ref 4.0–10.5)

## 2019-06-29 LAB — HEPATIC FUNCTION PANEL
ALT: 20 U/L (ref 0–35)
AST: 22 U/L (ref 0–37)
Albumin: 4 g/dL (ref 3.5–5.2)
Alkaline Phosphatase: 62 U/L (ref 39–117)
Bilirubin, Direct: 0.1 mg/dL (ref 0.0–0.3)
Total Bilirubin: 0.6 mg/dL (ref 0.2–1.2)
Total Protein: 7.2 g/dL (ref 6.0–8.3)

## 2019-06-29 LAB — BASIC METABOLIC PANEL
BUN: 13 mg/dL (ref 6–23)
CO2: 32 mEq/L (ref 19–32)
Calcium: 9.7 mg/dL (ref 8.4–10.5)
Chloride: 104 mEq/L (ref 96–112)
Creatinine, Ser: 0.83 mg/dL (ref 0.40–1.20)
GFR: 82.39 mL/min (ref >60.00)
Glucose, Bld: 102 mg/dL — ABNORMAL HIGH (ref 70–99)
Potassium: 3.7 mEq/L (ref 3.5–5.1)
Sodium: 140 mEq/L (ref 135–145)

## 2019-06-29 LAB — LIPID PANEL
Cholesterol: 188 mg/dL (ref 0–200)
HDL: 67.2 mg/dL (ref >39.00)
LDL Cholesterol: 104 mg/dL — ABNORMAL HIGH (ref 0–99)
NonHDL: 120.55
Total CHOL/HDL Ratio: 3
Triglycerides: 82 mg/dL (ref 0.0–149.0)
VLDL: 16.4 mg/dL (ref 0.0–40.0)

## 2019-06-29 LAB — HEMOGLOBIN A1C: Hgb A1c MFr Bld: 6 % (ref 4.6–6.5)

## 2019-06-29 LAB — TSH: TSH: 3.79 u[IU]/mL (ref 0.35–4.50)

## 2019-06-30 ENCOUNTER — Encounter: Payer: Self-pay | Admitting: Internal Medicine

## 2019-07-31 ENCOUNTER — Encounter: Payer: Self-pay | Admitting: Internal Medicine

## 2019-09-14 ENCOUNTER — Telehealth: Payer: Self-pay | Admitting: Internal Medicine

## 2019-09-14 NOTE — Telephone Encounter (Signed)
Patient fell 2 weeks ago in her house. She did bump her head but no injuries noted, just bruised in a few places. Everything has healed but she noticed a small lump in her groin area where one of the bruises were. No pain, no swelling, no discomfort. No other acute symptoms noted. Lump is not getting bigger. Are you okay with seeing her on Tuesday at 12:00 or would you prefer her be seen sooner?

## 2019-09-14 NOTE — Telephone Encounter (Signed)
Pt scheduled  

## 2019-09-14 NOTE — Telephone Encounter (Signed)
I am ok, if she is having no acute issues and is ok with me seeing her then.

## 2019-09-14 NOTE — Telephone Encounter (Signed)
Patient fell 2 weeks ago and she was bruised by the fall, now she has a lump underneath the skin where she feel. She wanted to speak to Dr. Nicki Reaper about what to do.

## 2019-09-18 ENCOUNTER — Ambulatory Visit (INDEPENDENT_AMBULATORY_CARE_PROVIDER_SITE_OTHER): Payer: Medicare Other | Admitting: Internal Medicine

## 2019-09-18 ENCOUNTER — Encounter: Payer: Self-pay | Admitting: Internal Medicine

## 2019-09-18 ENCOUNTER — Other Ambulatory Visit: Payer: Self-pay

## 2019-09-18 DIAGNOSIS — E785 Hyperlipidemia, unspecified: Secondary | ICD-10-CM

## 2019-09-18 DIAGNOSIS — R1909 Other intra-abdominal and pelvic swelling, mass and lump: Secondary | ICD-10-CM

## 2019-09-18 DIAGNOSIS — R739 Hyperglycemia, unspecified: Secondary | ICD-10-CM | POA: Diagnosis not present

## 2019-09-18 NOTE — Progress Notes (Signed)
Patient ID: Robin Hill, female   DOB: 06-29-1950, 69 y.o.   MRN: [id number]   Subjective:    Patient ID: Robin Hill, female    DOB: 18-Apr-1951, 69 y.o.   MRN: [id number]  HPI This visit occurred during the SARS-CoV-2 public health emergency.  Safety protocols were in place, including screening questions prior to the visit, additional usage of staff PPE, and extensive cleaning of exam room while observing appropriate contact time as indicated for disinfecting solutions.  Patient here for work in appt with concerns regarding knot - groin.  She fell two weeks ago in her house.  Bumped her head.  Some bruises - left shoulder and left leg.  No residual headache.  No light headedness or dizziness. No arm or leg pain.  Still persistent lump in groin. Wanted checked. No significant pain.  States otherwise doing well.  Breathing stable.  Handling stress.    Past Medical History:  Diagnosis Date  . Allergic rhinitis   . Allergy   . Asthma   . Breast cancer ([place]) 2012   mastectomy with chemo and rad tx  . Diverticulosis   . Dysmenorrhea   . Hypercholesterolemia    Past Surgical History:  Procedure Laterality Date  . BREAST BIOPSY Right 2012   benign  . BREAST SURGERY  2011   breast cancer  . CESAREAN SECTION  1970  . [place]  . CESAREAN SECTION  1987  . COLONOSCOPY WITH PROPOFOL N/A 10/03/2015   Procedure: COLONOSCOPY WITH PROPOFOL;  Surgeon: Manya Silvas, MD;  Location: [facility] ENDOSCOPY;  Service: Endoscopy;  Laterality: N/A;  . MASTECTOMY Left 2012   with chemo and rad tx  . NASAL SINUS SURGERY  07/2001   Family History  Problem Relation Age of Onset  . Cirrhosis Brother        liver  . Breast cancer Paternal Aunt        2 aunts   Social History   Socioeconomic History  . Marital status: Divorced    Spouse name: Not on file  . Number of children: Not on file  . Years of education: Not on file  . Highest education level: Not on file  Occupational  History  . Not on file  Tobacco Use  . Smoking status: Former Smoker    Types: Cigarettes  . Smokeless tobacco: Never Used  Substance and Sexual Activity  . Alcohol use: Yes    Alcohol/week: 1.0 standard drinks    Types: 1 Glasses of wine per week    Comment: OCC  . Drug use: No  . Sexual activity: Never  Other Topics Concern  . Not on file  Social History Narrative  . Not on file   Social Determinants of Health   Financial Resource Strain:   . Difficulty of Paying Living Expenses:   Food Insecurity:   . Worried About Charity fundraiser in the Last Year:   . Arboriculturist in the Last Year:   Transportation Needs:   . Film/video editor (Medical):   Marland Kitchen Lack of Transportation (Non-Medical):   Physical Activity:   . Days of Exercise per Week:   . Minutes of Exercise per Session:   Stress:   . Feeling of Stress :   Social Connections:   . Frequency of Communication with Friends and Family:   . Frequency of Social Gatherings with Friends and Family:   . Attends Religious Services:   . Active Member  of Clubs or Organizations:   . Attends Archivist Meetings:   Marland Kitchen Marital Status:     Outpatient Encounter Medications as of 09/18/2019  Medication Sig  . Albuterol Sulfate (PROAIR HFA IN) 90 mcg. 2 puffs using inhaler four times a day as needed.  Marland Kitchen anastrozole (ARIMIDEX) 1 MG tablet TAKE 1 TABLET BY MOUTH  DAILY  . Ascorbic Acid (VITAMIN C PO) Take by mouth.  Marland Kitchen azelastine (ASTELIN) 0.1 % nasal spray Place 2 sprays into both nostrils 2 (two) times daily. 137 mcg. Spray 2 sprays into both nostrils twice a day  . b complex vitamins capsule Take 1 capsule by mouth daily.  . budesonide-formoterol (SYMBICORT) 160-4.5 MCG/ACT inhaler USE 2 INHALATIONS BY MOUTH  TWICE DAILY  . cholecalciferol (VITAMIN D) 400 UNITS TABS Take 400 Units by mouth daily. Chewable  . Cyanocobalamin (VITAMIN B12 PO) Take 500 mcg by mouth daily. Administer 1 tablet by mouth once a day  .  doxycycline (VIBRA-TABS) 100 MG tablet Take 1 tablet (100 mg total) by mouth 2 (two) times daily.  . fluticasone (FLONASE) 50 MCG/ACT nasal spray USE 1 SPRAY INTO EACH  NOSTRIL TWICE A DAY  . levalbuterol (XOPENEX HFA) 45 MCG/ACT inhaler Inhale 2 puffs four times a day as needed  . montelukast (SINGULAIR) 10 MG tablet Take 1 tablet (10 mg total) by mouth at bedtime. 1 tablet once a day  . omeprazole (PRILOSEC) 10 MG capsule TAKE 1 CAPSULE BY MOUTH  DAILY  . pregabalin (LYRICA) 75 MG capsule Take 2 capsules (150 mg total) by mouth 2 (two) times daily.  . Probiotic Product (ALIGN) 4 MG CAPS Take one tablet daily while you are on the antibiotics and for two weeks after completing the antibiotics.  . rosuvastatin (CRESTOR) 20 MG tablet TAKE 1 TABLET BY MOUTH  DAILY  . sodium chloride (OCEAN) 0.65 % nasal spray Place 1 spray into the nose as needed for congestion.  . triamcinolone cream (KENALOG) 0.1 % Apply 1 application topically 2 (two) times daily.  . vitamin E 400 UNIT capsule Take 400 Units by mouth daily.   No facility-administered encounter medications on file as of 09/18/2019.    Review of Systems  Constitutional: Negative for appetite change and unexpected weight change.  Respiratory: Negative for cough, chest tightness and shortness of breath.   Cardiovascular: Negative for chest pain and leg swelling.  Gastrointestinal: Negative for diarrhea, nausea and vomiting.  Musculoskeletal: Negative for joint swelling and myalgias.  Skin: Negative for color change and rash.  Neurological: Negative for dizziness, light-headedness and headaches.  Psychiatric/Behavioral: Negative for agitation and dysphoric mood.       Objective:    Physical Exam Constitutional:      General: She is not in acute distress.    Appearance: Normal appearance.  HENT:     Head: Normocephalic and atraumatic.     Right Ear: External ear normal.     Left Ear: External ear normal.  Eyes:     General:         Right eye: No discharge.        Left eye: No discharge.     Conjunctiva/sclera: Conjunctivae normal.  Neck:     Thyroid: No thyromegaly.  Cardiovascular:     Rate and Rhythm: Normal rate and regular rhythm.  Pulmonary:     Effort: No respiratory distress.     Breath sounds: Normal breath sounds. No wheezing.  Abdominal:     General: Bowel sounds are normal.  Palpations: Abdomen is soft.     Tenderness: There is no abdominal tenderness.  Musculoskeletal:        General: No swelling or tenderness.     Cervical back: Neck supple. No tenderness.  Lymphadenopathy:     Cervical: No cervical adenopathy.  Skin:    Findings: No erythema or rash.     Comments: Palpable area - appears to be c/w hematoma.  No pain.  No increased erythema or warmth.    Neurological:     Mental Status: She is alert.     BP 122/64   Pulse 89   Temp 98.2 F (36.8 C)   Resp 16   Wt 221 lb (100.2 kg)   SpO2 98%   BMI 37.93 kg/m  Wt Readings from Last 3 Encounters:  09/18/19 221 lb (100.2 kg)  06/08/19 212 lb (96.2 kg)  12/29/18 212 lb (96.2 kg)     Lab Results  Component Value Date   WBC 4.9 06/29/2019   HGB 12.7 06/29/2019   HCT 37.8 06/29/2019   PLT 177.0 06/29/2019   GLUCOSE 102 (H) 06/29/2019   CHOL 188 06/29/2019   TRIG 82.0 06/29/2019   HDL 67.20 06/29/2019   LDLDIRECT 126.2 05/09/2013   LDLCALC 104 (H) 06/29/2019   ALT 20 06/29/2019   AST 22 06/29/2019   NA 140 06/29/2019   K 3.7 06/29/2019   CL 104 06/29/2019   CREATININE 0.83 06/29/2019   BUN 13 06/29/2019   CO2 32 06/29/2019   TSH 3.79 06/29/2019   HGBA1C 6.0 06/29/2019   MICROALBUR <0.7 09/09/2014    MM 3D SCREEN BREAST UNI RIGHT  Result Date: 11/17/2018 CLINICAL DATA:  Screening. EXAM: DIGITAL SCREENING UNILATERAL RIGHT MAMMOGRAM WITH CAD AND TOMO COMPARISON:  Previous exam(s). ACR Breast Density Category b: There are scattered areas of fibroglandular density. FINDINGS: There are no findings suspicious for  malignancy. Images were processed with CAD. IMPRESSION: No mammographic evidence of malignancy. A result letter of this screening mammogram will be mailed directly to the patient. RECOMMENDATION: Screening mammogram in one year. (Code:SM-B-01Y) BI-RADS CATEGORY  1: Negative. Electronically Signed   By: Abelardo Diesel M.D.   On: 11/17/2018 15:20       Assessment & Plan:   Problem List Items Addressed This Visit    HLD (hyperlipidemia)    On crestor.  Follow lipid panel and liver function tests.        Hyperglycemia    Follow met b and a1c.       Nodule of groin    Right groin - nodule - appears to be c/w hematoma.  No increased pain.  Should gradually reabsorb.  Follow.            Einar Pheasant, MD

## 2019-10-01 ENCOUNTER — Encounter: Payer: Self-pay | Admitting: Internal Medicine

## 2019-10-01 DIAGNOSIS — R1909 Other intra-abdominal and pelvic swelling, mass and lump: Secondary | ICD-10-CM | POA: Insufficient documentation

## 2019-10-01 NOTE — Assessment & Plan Note (Signed)
Follow met b and a1c.  

## 2019-10-01 NOTE — Assessment & Plan Note (Signed)
On crestor.  Follow lipid panel and liver function tests.   

## 2019-10-01 NOTE — Assessment & Plan Note (Signed)
Right groin - nodule - appears to be c/w hematoma.  No increased pain.  Should gradually reabsorb.  Follow.

## 2019-10-18 DIAGNOSIS — H2513 Age-related nuclear cataract, bilateral: Secondary | ICD-10-CM | POA: Diagnosis not present

## 2019-10-23 ENCOUNTER — Other Ambulatory Visit: Payer: Self-pay | Admitting: Internal Medicine

## 2019-10-23 ENCOUNTER — Other Ambulatory Visit: Payer: Self-pay | Admitting: *Deleted

## 2019-10-23 DIAGNOSIS — G629 Polyneuropathy, unspecified: Secondary | ICD-10-CM

## 2019-10-23 NOTE — Telephone Encounter (Signed)
Dr. Rogue Bussing - please advise on patient's preference for changing her current dose of lyrica. New script would need to be sent to [facility].

## 2019-10-23 NOTE — Telephone Encounter (Signed)
[place] with me to increase the dose of lyrica to 100 mg. Thanks GB

## 2019-10-23 NOTE — Telephone Encounter (Signed)
Patient called stating that she needs a refill of her Lyrica sent to [facility] (where she gets patient assistance) for 100 mg instead of the 75 mg she has been getting,. She states that this has been discussed about increasing her dose.

## 2019-10-24 ENCOUNTER — Telehealth: Payer: Self-pay | Admitting: Internal Medicine

## 2019-10-24 MED ORDER — PREGABALIN 100 MG PO CAPS: 100.0000 mg | ORAL_CAPSULE | Freq: Two times a day (BID) | ORAL | 4 refills | Status: DC

## 2019-10-24 NOTE — Addendum Note (Signed)
Addended by: Gloris Ham on: 10/24/2019 09:39 AM   Modules accepted: Orders

## 2019-10-24 NOTE — Telephone Encounter (Signed)
Script faxed to [facility] with patient. Pt aware that md increased dose to lyrica 100 mg 1 tablet twice daily. Patient aware that script was faxed to [place].

## 2019-10-24 NOTE — Telephone Encounter (Signed)
Spoken to patient, she does have neuropathy and varicose veins. She feels like something is crawling under skin above right knee. She does not have any leg swelling, redness, pain, SOB, blurry vision, chest pain, arm pain, and jaw pain.  She just has the weird sensation. Appointment was scheduled for this Friday will Dawson Bills NP

## 2019-10-24 NOTE — Telephone Encounter (Signed)
Pt stated that her varicose veins in the upper part of her leg are starting to hurt and it feels like there is something moving inside of the vein

## 2019-10-26 ENCOUNTER — Ambulatory Visit (INDEPENDENT_AMBULATORY_CARE_PROVIDER_SITE_OTHER): Payer: Medicare Other | Admitting: Nurse Practitioner

## 2019-10-26 ENCOUNTER — Other Ambulatory Visit: Payer: Self-pay

## 2019-10-26 ENCOUNTER — Encounter: Payer: Self-pay | Admitting: Nurse Practitioner

## 2019-10-26 VITALS — BP 130/70 | HR 75 | Temp 97.0°F | Ht 64.0 in | Wt 218.8 lb

## 2019-10-26 DIAGNOSIS — G5791 Unspecified mononeuropathy of right lower limb: Secondary | ICD-10-CM | POA: Diagnosis not present

## 2019-10-26 NOTE — Patient Instructions (Addendum)
You have some type of nerve conduction sensation in that right thigh area as you describe a crawling, numbness feeling that is intermittent.    I will check an ultrasound since you have spider veins in that area and will make sure the venous system is okay.    I have put in a referral to Dr. Brigitte Pulse in neurology to help evaluate this problem.  Call us back if you have not heard about an appointment within 2 weeks.  Maybe your increased dose of Lyrica will help this when you begin it.   Please make a follow-up with Dr. Nicki Reaper for reevaluation as well.  Neuropathic Pain Neuropathic pain is pain caused by damage to the nerves that are responsible for certain sensations in your body (sensory nerves). The pain can be caused by:  Damage to the sensory nerves that send signals to your spinal cord and brain (peripheral nervous system).  Damage to the sensory nerves in your brain or spinal cord (central nervous system). Neuropathic pain can make you more sensitive to pain. Even a minor sensation can feel very painful. This is usually a long-term condition that can be difficult to treat. The type of pain differs from person to person. It may:  Start suddenly (acute), or it may develop slowly and last for a long time (chronic).  Come and go as damaged nerves heal, or it may stay at the same level for years.  Cause emotional distress, loss of sleep, and a lower quality of life. What are the causes? The most common cause of this condition is diabetes. Many other diseases and conditions can also cause neuropathic pain. Causes of neuropathic pain can be classified as:  Toxic. This is caused by medicines and chemicals. The most common cause of toxic neuropathic pain is damage from cancer treatments (chemotherapy).  Metabolic. This can be caused by: ? Diabetes. This is the most common disease that damages the nerves. ? Lack of vitamin B from long-term alcohol abuse.  Traumatic. Any injury that cuts,  crushes, or stretches a nerve can cause damage and pain. A common example is feeling pain after losing an arm or leg (phantom limb pain).  Compression-related. If a sensory nerve gets trapped or compressed for a long period of time, the blood supply to the nerve can be cut off.  Vascular. Many blood vessel diseases can cause neuropathic pain by decreasing blood supply and oxygen to nerves.  Autoimmune. This type of pain results from diseases in which the body's defense system (immune system) mistakenly attacks sensory nerves. Examples of autoimmune diseases that can cause neuropathic pain include lupus and multiple sclerosis.  Infectious. Many types of viral infections can damage sensory nerves and cause pain. Shingles infection is a common cause of this type of pain.  Inherited. Neuropathic pain can be a symptom of many diseases that are passed down through families (genetic). What increases the risk? You are more likely to develop this condition if:  You have diabetes.  You smoke.  You drink too much alcohol.  You are taking certain medicines, including medicines that kill cancer cells (chemotherapy) or that treat immune system disorders. What are the signs or symptoms? The main symptom is pain. Neuropathic pain is often described as:  Burning.  Shock-like.  Stinging.  Hot or cold.  Itching. How is this diagnosed? No single test can diagnose neuropathic pain. It is diagnosed based on:  Physical exam and your symptoms. Your health care provider will ask you about your  pain. You may be asked to use a pain scale to describe how bad your pain is.  Tests. These may be done to see if you have a high sensitivity to pain and to help find the cause and location of any sensory nerve damage. They include: ? Nerve conduction studies to test how well nerve signals travel through your sensory nerves (electrodiagnostic testing). ? Stimulating your sensory nerves through electrodes on your  skin and measuring the response in your spinal cord and brain (somatosensory evoked potential).  Imaging studies, such as: ? X-rays. ? CT scan. ? MRI. How is this treated? Treatment for neuropathic pain may change over time. You may need to try different treatment options or a combination of treatments. Some options include:  Treating the underlying cause of the neuropathy, such as diabetes, kidney disease, or vitamin deficiencies.  Stopping medicines that can cause neuropathy, such as chemotherapy.  Medicine to relieve pain. Medicines may include: ? Prescription or over-the-counter pain medicine. ? Anti-seizure medicine. ? Antidepressant medicines. ? Pain-relieving patches that are applied to painful areas of skin. ? A medicine to numb the area (local anesthetic), which can be injected as a nerve block.  Transcutaneous nerve stimulation. This uses electrical currents to block painful nerve signals. The treatment is painless.  Alternative treatments, such as: ? Acupuncture. ? Meditation. ? Massage. ? Physical therapy. ? Pain management programs. ? Counseling. Follow these instructions at home: Medicines   Take over-the-counter and prescription medicines only as told by your health care provider.  Do not drive or use heavy machinery while taking prescription pain medicine.  If you are taking prescription pain medicine, take actions to prevent or treat constipation. Your health care provider may recommend that you: ? Drink enough fluid to keep your urine pale yellow. ? Eat foods that are high in fiber, such as fresh fruits and vegetables, whole grains, and beans. ? Limit foods that are high in fat and processed sugars, such as fried or sweet foods. ? Take an over-the-counter or prescription medicine for constipation. Lifestyle   Have a good support system at home.  Consider joining a chronic pain support group.  Do not use any products that contain nicotine or tobacco,  such as cigarettes and e-cigarettes. If you need help quitting, ask your health care provider.  Do not drink alcohol. General instructions  Learn as much as you can about your condition.  Work closely with all your health care providers to find the treatment plan that works best for you.  Ask your health care provider what activities are safe for you.  Keep all follow-up visits as told by your health care provider. This is important. Contact a health care provider if:  Your pain treatments are not working.  You are having side effects from your medicines.  You are struggling with tiredness (fatigue), mood changes, depression, or anxiety. Summary  Neuropathic pain is pain caused by damage to the nerves that are responsible for certain sensations in your body (sensory nerves).  Neuropathic pain may come and go as damaged nerves heal, or it may stay at the same level for years.  Neuropathic pain is usually a long-term condition that can be difficult to treat. Consider joining a chronic pain support group. This information is not intended to replace advice given to you by your health care provider. Make sure you discuss any questions you have with your health care provider. Document Revised: 08/10/2018 Document Reviewed: 05/06/2017 Elsevier Patient Education  [place].

## 2019-10-26 NOTE — Progress Notes (Signed)
Patient presenting with strange sensations in the outer right thigh. States it feels as if something is crawling in her leg.   Patient has a history of neuropathy with on and off swelling in the right leg.  No pain today

## 2019-10-26 NOTE — Progress Notes (Signed)
Established Patient Office Visit  Subjective:  Patient ID: Robin Hill, female    DOB: 1951-02-28  Age: 69 y.o. MRN: [id number]  CC:  Chief Complaint  Patient presents with  . Leg Problem    HPI Robin Hill is a 69 year old patient with history of peripheral neuropathy from 2011 breast cancer chemotherapy.  She has neuropathy in her fingers and toes.  For the last several months, she has noticed funny feeling in her right upper thigh.  It feels like there is something crawling under her skin.  Its not painful but more of and numbness -unusual sensation.  She notices especially when she is standing or walking.  But, sitting now she notices that this there is asymmetry in how her bilateral thighs feel.  She does have a cluster of superficial varicose veins in this area and is wondering if it could be related to the veins or peripheral neuropathy that is climbed up her leg.  Her Lyrica dose was just increased from 50 mg to 100 mg to help her neuropathy in her fingers and toes.  She has not discussed this thigh sensation with her primary care provider.  She is actively followed by Dr. Rogue Bussing in oncology and remains on Arimidex 1 mg daily.  She has no right calf pain, swelling, fevers or chills, chest pain or shortness of breath.  She has superficial varicose veins in the left leg as well but has no unusual sensations there.  She has had no injury or trauma to the thigh.  She has no lower back pain.   Past Medical History:  Diagnosis Date  . Allergic rhinitis   . Allergy   . Asthma   . Breast cancer ([place]) 2012   mastectomy with chemo and rad tx  . Diverticulosis   . Dysmenorrhea   . Hypercholesterolemia     Past Surgical History:  Procedure Laterality Date  . BREAST BIOPSY Right 2012   benign  . BREAST SURGERY  2011   breast cancer  . CESAREAN SECTION  1970  . [place]  . CESAREAN SECTION  1987  . COLONOSCOPY WITH PROPOFOL N/A 10/03/2015   Procedure:  COLONOSCOPY WITH PROPOFOL;  Surgeon: Manya Silvas, MD;  Location: [facility] ENDOSCOPY;  Service: Endoscopy;  Laterality: N/A;  . MASTECTOMY Left 2012   with chemo and rad tx  . NASAL SINUS SURGERY  07/2001    Family History  Problem Relation Age of Onset  . Cirrhosis Brother        liver  . Breast cancer Paternal Aunt        2 aunts    Social History   Socioeconomic History  . Marital status: Divorced    Spouse name: Not on file  . Number of children: Not on file  . Years of education: Not on file  . Highest education level: Not on file  Occupational History  . Not on file  Tobacco Use  . Smoking status: Former Smoker    Types: Cigarettes  . Smokeless tobacco: Never Used  Vaping Use  . Vaping Use: Never used  Substance and Sexual Activity  . Alcohol use: Yes    Alcohol/week: 1.0 standard drink    Types: 1 Glasses of wine per week    Comment: OCC  . Drug use: No  . Sexual activity: Never  Other Topics Concern  . Not on file  Social History Narrative  . Not on file   Social Determinants of  Health   Financial Resource Strain:   . Difficulty of Paying Living Expenses:   Food Insecurity:   . Worried About Charity fundraiser in the Last Year:   . Arboriculturist in the Last Year:   Transportation Needs:   . Film/video editor (Medical):   Marland Kitchen Lack of Transportation (Non-Medical):   Physical Activity:   . Days of Exercise per Week:   . Minutes of Exercise per Session:   Stress:   . Feeling of Stress :   Social Connections:   . Frequency of Communication with Friends and Family:   . Frequency of Social Gatherings with Friends and Family:   . Attends Religious Services:   . Active Member of Clubs or Organizations:   . Attends Archivist Meetings:   Marland Kitchen Marital Status:   Intimate Partner Violence:   . Fear of Current or Ex-Partner:   . Emotionally Abused:   Marland Kitchen Physically Abused:   . Sexually Abused:     Outpatient Medications Prior to Visit    Medication Sig Dispense Refill  . Albuterol Sulfate (PROAIR HFA IN) 90 mcg. 2 puffs using inhaler four times a day as needed.    Marland Kitchen anastrozole (ARIMIDEX) 1 MG tablet TAKE 1 TABLET BY MOUTH  DAILY 90 tablet 3  . Ascorbic Acid (VITAMIN C PO) Take by mouth.    Marland Kitchen azelastine (ASTELIN) 0.1 % nasal spray Place 2 sprays into both nostrils 2 (two) times daily. 137 mcg. Spray 2 sprays into both nostrils twice a day 90 mL 1  . b complex vitamins capsule Take 1 capsule by mouth daily.    . budesonide-formoterol (SYMBICORT) 160-4.5 MCG/ACT inhaler USE 2 INHALATIONS BY MOUTH  TWICE DAILY 30.6 g 3  . cholecalciferol (VITAMIN D) 400 UNITS TABS Take 400 Units by mouth daily. Chewable    . Cyanocobalamin (VITAMIN B12 PO) Take 500 mcg by mouth daily. Administer 1 tablet by mouth once a day    . doxycycline (VIBRA-TABS) 100 MG tablet Take 1 tablet (100 mg total) by mouth 2 (two) times daily. 14 tablet 0  . levalbuterol (XOPENEX HFA) 45 MCG/ACT inhaler Inhale 2 puffs four times a day as needed    . montelukast (SINGULAIR) 10 MG tablet Take 1 tablet (10 mg total) by mouth at bedtime. 1 tablet once a day 90 tablet 3  . omeprazole (PRILOSEC) 10 MG capsule TAKE 1 CAPSULE BY MOUTH  DAILY 90 capsule 3  . pregabalin (LYRICA) 100 MG capsule Take 1 capsule (100 mg total) by mouth 2 (two) times daily. 60 capsule 4  . Probiotic Product (ALIGN) 4 MG CAPS Take one tablet daily while you are on the antibiotics and for two weeks after completing the antibiotics. 30 capsule 0  . rosuvastatin (CRESTOR) 20 MG tablet TAKE 1 TABLET BY MOUTH  DAILY 90 tablet 3  . sodium chloride (OCEAN) 0.65 % nasal spray Place 1 spray into the nose as needed for congestion.    . vitamin E 400 UNIT capsule Take 400 Units by mouth daily.    . fluticasone (FLONASE) 50 MCG/ACT nasal spray USE 1 SPRAY INTO EACH  NOSTRIL TWICE A DAY (Patient not taking: Reported on 10/26/2019) 48 g 3  . triamcinolone cream (KENALOG) 0.1 % Apply 1 application topically 2 (two)  times daily. (Patient not taking: Reported on 10/26/2019) 30 g 0   No facility-administered medications prior to visit.    No Known Allergies   Review of Systems  Constitutional:  Negative for chills and fever.  Respiratory: Negative for cough and shortness of breath.   Cardiovascular: Negative for chest pain, palpitations and leg swelling.  Gastrointestinal: Negative.   Musculoskeletal: Negative for arthralgias, back pain and gait problem.  Hematological: Does not bruise/bleed easily.      Objective:    Physical Exam Vitals reviewed.  Constitutional:      Appearance: Normal appearance. She is obese.  Cardiovascular:     Rate and Rhythm: Normal rate and regular rhythm.     Pulses: Normal pulses.     Heart sounds: Normal heart sounds.  Pulmonary:     Effort: Pulmonary effort is normal.     Breath sounds: Normal breath sounds.  Musculoskeletal:        General: Normal range of motion.     Cervical back: Normal range of motion and neck supple.     Comments: Right thigh with spider veins.  No skin lesions, reports normal sensation to touch.  No leg swelling, intact distal pulses. Nomral ROM. Decreased DTR's. bilat  Skin:    General: Skin is warm and dry.  Neurological:     General: No focal deficit present.     Mental Status: She is alert and oriented to person, place, and time.  Psychiatric:        Mood and Affect: Mood normal.        Behavior: Behavior normal.     BP 130/70   Pulse 75   Temp (!) 97 F (36.1 C) (Temporal)   Ht 5\' 4"  (1.626 m)   Wt 218 lb 12.8 oz (99.2 kg)   SpO2 97%   BMI 37.56 kg/m  Wt Readings from Last 3 Encounters:  10/26/19 218 lb 12.8 oz (99.2 kg)  09/18/19 221 lb (100.2 kg)  06/08/19 212 lb (96.2 kg)     There are no preventive care reminders to display for this patient.  There are no preventive care reminders to display for this patient.  Lab Results  Component Value Date   TSH 3.79 06/29/2019   Lab Results  Component Value  Date   WBC 4.9 06/29/2019   HGB 12.7 06/29/2019   HCT 37.8 06/29/2019   MCV 91.8 06/29/2019   PLT 177.0 06/29/2019   Lab Results  Component Value Date   NA 140 06/29/2019   K 3.7 06/29/2019   CO2 32 06/29/2019   GLUCOSE 102 (H) 06/29/2019   BUN 13 06/29/2019   CREATININE 0.83 06/29/2019   BILITOT 0.6 06/29/2019   ALKPHOS 62 06/29/2019   AST 22 06/29/2019   ALT 20 06/29/2019   PROT 7.2 06/29/2019   ALBUMIN 4.0 06/29/2019   CALCIUM 9.7 06/29/2019   ANIONGAP 7 12/29/2018   GFR 82.39 06/29/2019   Lab Results  Component Value Date   CHOL 188 06/29/2019   Lab Results  Component Value Date   HDL 67.20 06/29/2019   Lab Results  Component Value Date   LDLCALC 104 (H) 06/29/2019   Lab Results  Component Value Date   TRIG 82.0 06/29/2019   Lab Results  Component Value Date   CHOLHDL 3 06/29/2019   Lab Results  Component Value Date   HGBA1C 6.0 06/29/2019      Assessment & Plan:   Problem List Items Addressed This Visit      Nervous and Auditory   Neuropathy of right thigh - Primary   Relevant Orders   US Venous Img Lower Unilateral Right   Ambulatory referral to Neurology  No orders of the defined types were placed in this encounter.  She presents with some type of nerve conduction sensation in that right thigh area as described a crawling, numbness feeling that is intermittent.  Etiology is unclear.  I do not know if this is originating from the thigh itself and she does have a cluster of spider veins in the area so I will check venous ultrasound of the right leg. Or, if she could have referred pain from her lumbar spine.  She has no back pain or radiculopathy otherwise.   I have put in a referral to Dr. Brigitte Pulse in neurology to help evaluate this problem.   She has been placed on an increased dose of Lyrica to help with known hand and feet peripheral neuropathy.  Maybe the increased dose of Lyrica will help this when she begins it.   She has a follow-up  with Dr. Nicki Reaper on 11/08/2019.  I will send a copy of this note to Dr. Rogue Bussing and oncology as well.  Follow-up: Follow-up office visit with Dr. Nicki Reaper on 11/08/2019.   This visit occurred during the SARS-CoV-2 public health emergency.  Safety protocols were in place, including screening questions prior to the visit, additional usage of staff PPE, and extensive cleaning of exam room while observing appropriate contact time as indicated for disinfecting solutions.   Denice Paradise, NP

## 2019-10-29 ENCOUNTER — Encounter: Payer: Self-pay | Admitting: Nurse Practitioner

## 2019-10-29 ENCOUNTER — Ambulatory Visit: Payer: Medicare Other

## 2019-10-31 ENCOUNTER — Other Ambulatory Visit: Payer: Self-pay | Admitting: *Deleted

## 2019-10-31 MED ORDER — PREGABALIN 100 MG PO CAPS: 100.0000 mg | ORAL_CAPSULE | Freq: Two times a day (BID) | ORAL | 1 refills | Status: DC

## 2019-10-31 NOTE — Telephone Encounter (Signed)
Patient needs 90 day supply (#180 capsules) of her Lyrica sent to [facility] # [phone]. A prescription was sent, but only for a 30 day supply and they will not fill it for her

## 2019-11-08 ENCOUNTER — Ambulatory Visit (INDEPENDENT_AMBULATORY_CARE_PROVIDER_SITE_OTHER): Payer: Medicare Other | Admitting: Internal Medicine

## 2019-11-08 ENCOUNTER — Other Ambulatory Visit: Payer: Self-pay

## 2019-11-08 ENCOUNTER — Other Ambulatory Visit (HOSPITAL_COMMUNITY)
Admission: RE | Admit: 2019-11-08 | Discharge: 2019-11-08 | Disposition: A | Discharge status: Home / Self Care | Payer: Medicare Other | Source: Ambulatory Visit | Attending: Internal Medicine | Admitting: Internal Medicine

## 2019-11-08 VITALS — BP 126/72 | HR 78 | Temp 98.0°F | Resp 16 | Ht 64.0 in | Wt 221.0 lb

## 2019-11-08 DIAGNOSIS — N898 Other specified noninflammatory disorders of vagina: Secondary | ICD-10-CM

## 2019-11-08 DIAGNOSIS — E785 Hyperlipidemia, unspecified: Secondary | ICD-10-CM

## 2019-11-08 DIAGNOSIS — N760 Acute vaginitis: Secondary | ICD-10-CM | POA: Insufficient documentation

## 2019-11-08 DIAGNOSIS — K219 Gastro-esophageal reflux disease without esophagitis: Secondary | ICD-10-CM | POA: Diagnosis not present

## 2019-11-08 DIAGNOSIS — Z0000 Encounter for general adult medical examination without abnormal findings: Secondary | ICD-10-CM

## 2019-11-08 DIAGNOSIS — R739 Hyperglycemia, unspecified: Secondary | ICD-10-CM | POA: Diagnosis not present

## 2019-11-08 DIAGNOSIS — C50812 Malignant neoplasm of overlapping sites of left female breast: Secondary | ICD-10-CM

## 2019-11-08 DIAGNOSIS — Z170 Estrogen receptor positive status [ER+]: Secondary | ICD-10-CM

## 2019-11-08 NOTE — Progress Notes (Signed)
Patient ID: Robin Hill, female   DOB: [birth year], 69 y.o.   MRN: [id number]   Subjective:    Patient ID: Robin Hill, female    DOB: 20-Jun-1950, 70 y.o.   MRN: [id number]  HPI This visit occurred during the SARS-CoV-2 public health emergency.  Safety protocols were in place, including screening questions prior to the visit, additional usage of staff PPE, and extensive cleaning of exam room while observing appropriate contact time as indicated for disinfecting solutions.  Patient here for her physical exam.  She she reports she is doing relatively well.  Trying to stay active.  No chest pain.  Breathing stable.  No acid reflux or abdominal pain reported.  Bowels moving.  Neuropathy - legs, fingers.  On lyrica.  Is walking.  Does report noticing vaginal discharge - brown discharge.  No bleeding.    Past Medical History:  Diagnosis Date  . Allergic rhinitis   . Allergy   . Asthma   . Breast cancer ([place]) 2012   mastectomy with chemo and rad tx  . Diverticulosis   . Dysmenorrhea   . Hypercholesterolemia    Past Surgical History:  Procedure Laterality Date  . BREAST BIOPSY Right 2012   benign  . BREAST SURGERY  2011   breast cancer  . CESAREAN SECTION  1970  . [place]  . CESAREAN SECTION  1987  . COLONOSCOPY WITH PROPOFOL N/A 10/03/2015   Procedure: COLONOSCOPY WITH PROPOFOL;  Surgeon: Manya Silvas, MD;  Location: [facility] ENDOSCOPY;  Service: Endoscopy;  Laterality: N/A;  . MASTECTOMY Left 2012   with chemo and rad tx  . NASAL SINUS SURGERY  07/2001   Family History  Problem Relation Age of Onset  . Cirrhosis Brother        liver  . Breast cancer Paternal Aunt        2 aunts   Social History   Socioeconomic History  . Marital status: Divorced    Spouse name: Not on file  . Number of children: Not on file  . Years of education: Not on file  . Highest education level: Not on file  Occupational History  . Not on file  Tobacco Use  . Smoking status:  Former Smoker    Types: Cigarettes  . Smokeless tobacco: Never Used  Vaping Use  . Vaping Use: Never used  Substance and Sexual Activity  . Alcohol use: Yes    Alcohol/week: 1.0 standard drink    Types: 1 Glasses of wine per week    Comment: OCC  . Drug use: No  . Sexual activity: Never  Other Topics Concern  . Not on file  Social History Narrative  . Not on file   Social Determinants of Health   Financial Resource Strain:   . Difficulty of Paying Living Expenses:   Food Insecurity:   . Worried About Charity fundraiser in the Last Year:   . Arboriculturist in the Last Year:   Transportation Needs:   . Film/video editor (Medical):   Marland Kitchen Lack of Transportation (Non-Medical):   Physical Activity:   . Days of Exercise per Week:   . Minutes of Exercise per Session:   Stress:   . Feeling of Stress :   Social Connections:   . Frequency of Communication with Friends and Family:   . Frequency of Social Gatherings with Friends and Family:   . Attends Religious Services:   . Active Member of  Clubs or Organizations:   . Attends Archivist Meetings:   Marland Kitchen Marital Status:     Outpatient Encounter Medications as of 11/08/2019  Medication Sig  . Albuterol Sulfate (PROAIR HFA IN) 90 mcg. 2 puffs using inhaler four times a day as needed.  Marland Kitchen anastrozole (ARIMIDEX) 1 MG tablet TAKE 1 TABLET BY MOUTH  DAILY  . Ascorbic Acid (VITAMIN C PO) Take by mouth.  Marland Kitchen azelastine (ASTELIN) 0.1 % nasal spray Place 2 sprays into both nostrils 2 (two) times daily. 137 mcg. Spray 2 sprays into both nostrils twice a day  . b complex vitamins capsule Take 1 capsule by mouth daily.  . budesonide-formoterol (SYMBICORT) 160-4.5 MCG/ACT inhaler USE 2 INHALATIONS BY MOUTH  TWICE DAILY  . cholecalciferol (VITAMIN D) 400 UNITS TABS Take 400 Units by mouth daily. Chewable  . Cyanocobalamin (VITAMIN B12 PO) Take 500 mcg by mouth daily. Administer 1 tablet by mouth once a day  . fluticasone (FLONASE) 50  MCG/ACT nasal spray USE 1 SPRAY INTO EACH  NOSTRIL TWICE A DAY (Patient not taking: Reported on 10/26/2019)  . levalbuterol (XOPENEX HFA) 45 MCG/ACT inhaler Inhale 2 puffs four times a day as needed  . montelukast (SINGULAIR) 10 MG tablet Take 1 tablet (10 mg total) by mouth at bedtime. 1 tablet once a day  . omeprazole (PRILOSEC) 10 MG capsule TAKE 1 CAPSULE BY MOUTH  DAILY  . pregabalin (LYRICA) 100 MG capsule Take 1 capsule (100 mg total) by mouth 2 (two) times daily.  . rosuvastatin (CRESTOR) 20 MG tablet TAKE 1 TABLET BY MOUTH  DAILY  . sodium chloride (OCEAN) 0.65 % nasal spray Place 1 spray into the nose as needed for congestion.  . vitamin E 400 UNIT capsule Take 400 Units by mouth daily.  . [DISCONTINUED] doxycycline (VIBRA-TABS) 100 MG tablet Take 1 tablet (100 mg total) by mouth 2 (two) times daily.  . [DISCONTINUED] Probiotic Product (ALIGN) 4 MG CAPS Take one tablet daily while you are on the antibiotics and for two weeks after completing the antibiotics.  . [DISCONTINUED] triamcinolone cream (KENALOG) 0.1 % Apply 1 application topically 2 (two) times daily. (Patient not taking: Reported on 10/26/2019)   No facility-administered encounter medications on file as of 11/08/2019.    Review of Systems  Constitutional: Negative for appetite change and unexpected weight change.  HENT: Negative for congestion and sinus pressure.   Eyes: Negative for pain and visual disturbance.  Respiratory: Negative for cough, chest tightness and shortness of breath.   Cardiovascular: Negative for chest pain, palpitations and leg swelling.  Gastrointestinal: Negative for abdominal pain, diarrhea, nausea and vomiting.  Genitourinary: Negative for difficulty urinating and dysuria.  Musculoskeletal: Negative for joint swelling and myalgias.  Skin: Negative for color change and rash.  Neurological: Negative for dizziness, light-headedness and headaches.       Neuropathy - legs, fingers.   Hematological:  Negative for adenopathy. Does not bruise/bleed easily.  Psychiatric/Behavioral: Negative for agitation and dysphoric mood.       Objective:    Physical Exam Vitals reviewed.  Constitutional:      General: She is not in acute distress.    Appearance: Normal appearance. She is well-developed.  HENT:     Head: Normocephalic and atraumatic.     Right Ear: External ear normal.     Left Ear: External ear normal.  Eyes:     General: No scleral icterus.       Right eye: No discharge.  Left eye: No discharge.     Conjunctiva/sclera: Conjunctivae normal.  Neck:     Thyroid: No thyromegaly.  Cardiovascular:     Rate and Rhythm: Normal rate and regular rhythm.  Pulmonary:     Effort: No tachypnea, accessory muscle usage or respiratory distress.     Breath sounds: Normal breath sounds. No decreased breath sounds or wheezing.  Chest:     Breasts:        Right: No inverted nipple, mass, nipple discharge or tenderness (no axillary adenopathy).        Left: No inverted nipple, mass, nipple discharge or tenderness (no axilarry adenopathy).  Abdominal:     General: Bowel sounds are normal.     Palpations: Abdomen is soft.     Tenderness: There is no abdominal tenderness.  Musculoskeletal:        General: No swelling or tenderness.     Cervical back: Neck supple. No tenderness.  Lymphadenopathy:     Cervical: No cervical adenopathy.  Skin:    Findings: No erythema or rash.  Neurological:     Mental Status: She is alert and oriented to person, place, and time.  Psychiatric:        Mood and Affect: Mood normal.        Behavior: Behavior normal.     BP 126/72   Pulse 78   Temp 98 F (36.7 C)   Resp 16   Ht _0  (1.626 m)   Wt 221 lb (100.2 kg)   SpO2 97%   BMI 37.93 kg/m  Wt Readings from Last 3 Encounters:  11/08/19 221 lb (100.2 kg)  10/26/19 218 lb 12.8 oz (99.2 kg)  09/18/19 221 lb (100.2 kg)     Lab Results  Component Value Date   WBC 4.9 06/29/2019   HGB  12.7 06/29/2019   HCT 37.8 06/29/2019   PLT 177.0 06/29/2019   GLUCOSE 102 (H) 06/29/2019   CHOL 188 06/29/2019   TRIG 82.0 06/29/2019   HDL 67.20 06/29/2019   LDLDIRECT 126.2 05/09/2013   LDLCALC 104 (H) 06/29/2019   ALT 20 06/29/2019   AST 22 06/29/2019   NA 140 06/29/2019   K 3.7 06/29/2019   CL 104 06/29/2019   CREATININE 0.83 06/29/2019   BUN 13 06/29/2019   CO2 32 06/29/2019   TSH 3.79 06/29/2019   HGBA1C 6.0 06/29/2019   MICROALBUR <0.7 09/09/2014    MM 3D SCREEN BREAST UNI RIGHT  Result Date: 11/17/2018 CLINICAL DATA:  Screening. EXAM: DIGITAL SCREENING UNILATERAL RIGHT MAMMOGRAM WITH CAD AND TOMO COMPARISON:  Previous exam(s). ACR Breast Density Category b: There are scattered areas of fibroglandular density. FINDINGS: There are no findings suspicious for malignancy. Images were processed with CAD. IMPRESSION: No mammographic evidence of malignancy. A result letter of this screening mammogram will be mailed directly to the patient. RECOMMENDATION: Screening mammogram in one year. (Code:SM-B-01Y) BI-RADS CATEGORY  1: Negative. Electronically Signed   By: Abelardo Diesel M.D.   On: 11/17/2018 15:20       Assessment & Plan:   Problem List Items Addressed This Visit    Carcinoma of overlapping sites of left breast in female, estrogen receptor positive ([place])    Seeing oncology.  On arimidex.        GERD (gastroesophageal reflux disease)    Controlled. On omeprazole.        Health care maintenance    Physical today 11/08/19.  PAP 08/24/17.  Mammogram 11/17/18 - Bir[facility] I.  Scheduled  for f/u mammogram.  Colonoscopy 10/2015.        HLD (hyperlipidemia)    On crestor.  Low cholesterol diet and exercise.  Follow lipid panel and liver function tests.        Relevant Orders   Hepatic function panel   Lipid panel   Basic metabolic panel   Hyperglycemia    Follow met b and a1c.       Relevant Orders   Hemoglobin A1c   Vaginal discharge    No bleeding.  Wet prep obtained.   Further w/up pending results.  May need gyn evaluation.         Other Visit Diagnoses    Routine general medical examination at a health care facility    -  Primary   Acute vaginitis       Relevant Orders   Cervicovaginal ancillary only( [place]) (Completed)       Einar Pheasant, MD

## 2019-11-08 NOTE — Assessment & Plan Note (Signed)
Physical today 11/08/19.  PAP 08/24/17.  Mammogram 11/17/18 - Bir[facility] I.  Scheduled for f/u mammogram.  Colonoscopy 10/2015.

## 2019-11-12 LAB — CERVICOVAGINAL ANCILLARY ONLY
Bacterial Vaginitis (gardnerella): POSITIVE — AB
Candida Glabrata: NEGATIVE
Candida Vaginitis: NEGATIVE
Comment: NEGATIVE
Comment: NEGATIVE
Comment: NEGATIVE

## 2019-11-13 ENCOUNTER — Encounter: Payer: Self-pay | Admitting: Internal Medicine

## 2019-11-13 ENCOUNTER — Other Ambulatory Visit: Payer: Self-pay

## 2019-11-13 DIAGNOSIS — C50912 Malignant neoplasm of unspecified site of left female breast: Secondary | ICD-10-CM | POA: Diagnosis not present

## 2019-11-13 DIAGNOSIS — N898 Other specified noninflammatory disorders of vagina: Secondary | ICD-10-CM | POA: Insufficient documentation

## 2019-11-13 MED ORDER — METRONIDAZOLE 0.75 % VA GEL: VAGINAL | 0 refills | Status: DC

## 2019-11-13 NOTE — Assessment & Plan Note (Signed)
Follow met b and a1c.  

## 2019-11-13 NOTE — Assessment & Plan Note (Signed)
On crestor.  Low cholesterol diet and exercise.  Follow lipid panel and liver function tests.   

## 2019-11-13 NOTE — Assessment & Plan Note (Signed)
Controlled.  On omeprazole.   

## 2019-11-13 NOTE — Assessment & Plan Note (Signed)
Seeing oncology.  On arimidex.

## 2019-11-13 NOTE — Assessment & Plan Note (Signed)
No bleeding.  Wet prep obtained.  Further w/up pending results.  May need gyn evaluation.

## 2019-11-19 ENCOUNTER — Ambulatory Visit
Admission: RE | Admit: 2019-11-19 | Discharge: 2019-11-19 | Disposition: A | Discharge status: Home / Self Care | Payer: Medicare Other | Source: Ambulatory Visit | Attending: Internal Medicine | Admitting: Internal Medicine

## 2019-11-19 DIAGNOSIS — C50812 Malignant neoplasm of overlapping sites of left female breast: Secondary | ICD-10-CM | POA: Diagnosis not present

## 2019-11-19 DIAGNOSIS — Z1231 Encounter for screening mammogram for malignant neoplasm of breast: Secondary | ICD-10-CM | POA: Diagnosis not present

## 2019-11-19 DIAGNOSIS — Z170 Estrogen receptor positive status [ER+]: Secondary | ICD-10-CM | POA: Insufficient documentation

## 2019-11-22 DIAGNOSIS — M79671 Pain in right foot: Secondary | ICD-10-CM | POA: Insufficient documentation

## 2019-11-22 DIAGNOSIS — G620 Drug-induced polyneuropathy: Secondary | ICD-10-CM | POA: Insufficient documentation

## 2019-11-22 DIAGNOSIS — R202 Paresthesia of skin: Secondary | ICD-10-CM | POA: Insufficient documentation

## 2019-11-22 DIAGNOSIS — R200 Anesthesia of skin: Secondary | ICD-10-CM | POA: Diagnosis not present

## 2019-11-22 DIAGNOSIS — T451X5A Adverse effect of antineoplastic and immunosuppressive drugs, initial encounter: Secondary | ICD-10-CM | POA: Insufficient documentation

## 2019-12-06 ENCOUNTER — Other Ambulatory Visit: Payer: Self-pay | Admitting: Internal Medicine

## 2019-12-28 ENCOUNTER — Other Ambulatory Visit: Payer: Self-pay

## 2019-12-28 DIAGNOSIS — Z170 Estrogen receptor positive status [ER+]: Secondary | ICD-10-CM

## 2019-12-31 ENCOUNTER — Inpatient Hospital Stay: Payer: Medicare Other | Attending: Internal Medicine

## 2019-12-31 ENCOUNTER — Other Ambulatory Visit: Payer: Self-pay

## 2019-12-31 ENCOUNTER — Inpatient Hospital Stay (HOSPITAL_BASED_OUTPATIENT_CLINIC_OR_DEPARTMENT_OTHER): Payer: Medicare Other | Admitting: Internal Medicine

## 2019-12-31 VITALS — BP 124/68 | HR 71 | Temp 98.0°F | Resp 16 | Ht 64.0 in | Wt 225.0 lb

## 2019-12-31 DIAGNOSIS — Z170 Estrogen receptor positive status [ER+]: Secondary | ICD-10-CM | POA: Insufficient documentation

## 2019-12-31 DIAGNOSIS — D649 Anemia, unspecified: Secondary | ICD-10-CM | POA: Insufficient documentation

## 2019-12-31 DIAGNOSIS — Z79899 Other long term (current) drug therapy: Secondary | ICD-10-CM | POA: Insufficient documentation

## 2019-12-31 DIAGNOSIS — C50812 Malignant neoplasm of overlapping sites of left female breast: Secondary | ICD-10-CM

## 2019-12-31 DIAGNOSIS — Z13820 Encounter for screening for osteoporosis: Secondary | ICD-10-CM | POA: Diagnosis not present

## 2019-12-31 DIAGNOSIS — Z79811 Long term (current) use of aromatase inhibitors: Secondary | ICD-10-CM | POA: Insufficient documentation

## 2019-12-31 DIAGNOSIS — G629 Polyneuropathy, unspecified: Secondary | ICD-10-CM | POA: Insufficient documentation

## 2019-12-31 DIAGNOSIS — Z87891 Personal history of nicotine dependence: Secondary | ICD-10-CM | POA: Diagnosis not present

## 2019-12-31 LAB — COMPREHENSIVE METABOLIC PANEL
ALT: 22 U/L (ref 0–44)
AST: 23 U/L (ref 15–41)
Albumin: 4 g/dL (ref 3.5–5.0)
Alkaline Phosphatase: 61 U/L (ref 38–126)
Anion gap: 7 (ref 5–15)
BUN: 12 mg/dL (ref 8–23)
CO2: 28 mmol/L (ref 22–32)
Calcium: 9.1 mg/dL (ref 8.9–10.3)
Chloride: 104 mmol/L (ref 98–111)
Creatinine, Ser: 0.8 mg/dL (ref 0.44–1.00)
GFR calc Af Amer: >60 mL/min (ref >60)
GFR calc non Af Amer: >60 mL/min (ref >60)
Glucose, Bld: 102 mg/dL — ABNORMAL HIGH (ref 70–99)
Potassium: 4.2 mmol/L (ref 3.5–5.1)
Sodium: 139 mmol/L (ref 135–145)
Total Bilirubin: 0.5 mg/dL (ref 0.3–1.2)
Total Protein: 7 g/dL (ref 6.5–8.1)

## 2019-12-31 LAB — IRON AND TIBC
Iron: 90 ug/dL (ref 28–170)
Saturation Ratios: 32 % — ABNORMAL HIGH (ref 10.4–31.8)
TIBC: 281 ug/dL (ref 250–450)
UIBC: 191 ug/dL

## 2019-12-31 LAB — CBC WITH DIFFERENTIAL/PLATELET
Abs Immature Granulocytes: 0.02 10*3/uL (ref 0.00–0.07)
Basophils Absolute: 0.1 10*3/uL (ref 0.0–0.1)
Basophils Relative: 1 %
Eosinophils Absolute: 0.5 10*3/uL (ref 0.0–0.5)
Eosinophils Relative: 7 %
HCT: 34.4 % — ABNORMAL LOW (ref 36.0–46.0)
Hemoglobin: 11.8 g/dL — ABNORMAL LOW (ref 12.0–15.0)
Immature Granulocytes: 0 %
Lymphocytes Relative: 30 %
Lymphs Abs: 2.1 10*3/uL (ref 0.7–4.0)
MCH: 30.8 pg (ref 26.0–34.0)
MCHC: 34.3 g/dL (ref 30.0–36.0)
MCV: 89.8 fL (ref 80.0–100.0)
Monocytes Absolute: 0.5 10*3/uL (ref 0.1–1.0)
Monocytes Relative: 7 %
Neutro Abs: 3.7 10*3/uL (ref 1.7–7.7)
Neutrophils Relative %: 55 %
Platelets: 166 10*3/uL (ref 150–400)
RBC: 3.83 MIL/uL — ABNORMAL LOW (ref 3.87–5.11)
RDW: 12.5 % (ref 11.5–15.5)
WBC: 6.8 10*3/uL (ref 4.0–10.5)
nRBC: 0 % (ref 0.0–0.2)

## 2019-12-31 LAB — FERRITIN: Ferritin: 61 ng/mL (ref 11–307)

## 2019-12-31 NOTE — Progress Notes (Signed)
[facility] OFFICE PROGRESS NOTE  Patient Care Team: Einar Pheasant, MD as PCP - General (Internal Medicine)  Cancer Staging No matching staging information was found for the patient.   Oncology History Overview Note  # 2011- LEFT BREAST [Dr.Smith; Dr.Pandit] Stage I IDC with lobular features pT1c (1.9cm) pSNmi-s/p Lumpec & RT; AC- Taxol x10 [sec to PN]; ER/PR-Pos; Her 2 NEU. [facility]- Aug 2017- Low risk of recurrence 1.3%; however- benefit from extended AI [node pos pt]  # 2012- RIGHT BREAST DCIS s/p mastectomy- on AI'  # MAY 2018- BMD- Normal.   # PN- G2- on Lyrica  # JEWOHA'S WITNESS -----------------------------------------------------------------  DIAGNOSIS: LEFT BREAST CA  STAGE:   I      ;GOALS: curative  CURRENT/MOST RECENT THERAPY: Arimidex    Carcinoma of overlapping sites of left breast in female, estrogen receptor positive ([place])     INTERVAL HISTORY:  Robin Hill 69 y.o.  female pleasant patient above history of stage I breast cancer is here for follow-up/on extended AI.  1 of patient's close friend passed away from [place].  Patient is quite sad; however seems to be coping up well.  Continues to have chronic mild tingling numbness in the legs.  She is on Lyrica.  No headaches.  No weight loss.  No worsening joint pains or bone pain.   Review of Systems  Constitutional: Negative for chills, diaphoresis, fever, malaise/fatigue and weight loss.  HENT: Negative for nosebleeds and sore throat.   Eyes: Negative for double vision.  Respiratory: Negative for cough, hemoptysis, sputum production, shortness of breath and wheezing.   Cardiovascular: Negative for chest pain, palpitations, orthopnea and leg swelling.  Gastrointestinal: Negative for abdominal pain, blood in stool, constipation, diarrhea, heartburn, melena, nausea and vomiting.  Genitourinary: Negative for dysuria, frequency and urgency.  Musculoskeletal: Negative for back pain and joint  pain.  Skin: Negative.  Negative for itching and rash.  Neurological: Negative for dizziness, tingling, focal weakness, weakness and headaches.  Endo/Heme/Allergies: Does not bruise/bleed easily.  Psychiatric/Behavioral: Negative for depression. The patient is not nervous/anxious and does not have insomnia.     PAST MEDICAL HISTORY :  Past Medical History:  Diagnosis Date  . Allergic rhinitis   . Allergy   . Asthma   . Breast cancer ([place]) 2012   mastectomy with chemo and rad tx  . Diverticulosis   . Dysmenorrhea   . Hypercholesterolemia     PAST SURGICAL HISTORY :   Past Surgical History:  Procedure Laterality Date  . BREAST BIOPSY Right 2012   benign  . BREAST SURGERY  2011   breast cancer  . CESAREAN SECTION  1970  . [place]  . CESAREAN SECTION  1987  . COLONOSCOPY WITH PROPOFOL N/A 10/03/2015   Procedure: COLONOSCOPY WITH PROPOFOL;  Surgeon: Manya Silvas, MD;  Location[facility] ENDOSCOPY;  Service: Endoscopy;  Laterality: N/A;  . MASTECTOMY Left 2012   with chemo and rad tx  . NASAL SINUS SURGERY  07/2001    FAMILY HISTORY :   Family History  Problem Relation Age of Onset  . Cirrhosis Brother        liver  . Breast cancer Paternal Aunt        2 aunts    SOCIAL HISTORY:   Social History   Tobacco Use  . Smoking status: Former Smoker    Types: Cigarettes  . Smokeless tobacco: Never Used  Vaping Use  . Vaping Use: Never used  Substance Use Topics  . Alcohol use: Yes    Alcohol/week: 1.0 standard drink    Types: 1 Glasses of wine per week    Comment: OCC  . Drug use: No    ALLERGIES:  has No Known Allergies.  MEDICATIONS:  Current Outpatient Medications  Medication Sig Dispense Refill  . Albuterol Sulfate (PROAIR HFA IN) 90 mcg. 2 puffs using inhaler four times a day as needed.    Marland Kitchen anastrozole (ARIMIDEX) 1 MG tablet TAKE 1 TABLET BY MOUTH  DAILY 90 tablet 3  . Ascorbic Acid (VITAMIN C PO) Take by mouth.    Marland Kitchen azelastine (ASTELIN) 0.1  % nasal spray Place 2 sprays into both nostrils 2 (two) times daily. 137 mcg. Spray 2 sprays into both nostrils twice a day 90 mL 1  . b complex vitamins capsule Take 1 capsule by mouth daily.    . budesonide-formoterol (SYMBICORT) 160-4.5 MCG/ACT inhaler USE 2 INHALATIONS BY MOUTH  TWICE DAILY 30.6 g 3  . cholecalciferol (VITAMIN D) 400 UNITS TABS Take 400 Units by mouth daily. Chewable    . Cyanocobalamin (VITAMIN B12 PO) Take 500 mcg by mouth daily. Administer 1 tablet by mouth once a day    . fluticasone (FLONASE) 50 MCG/ACT nasal spray USE 1 SPRAY INTO EACH  NOSTRIL TWICE A DAY 48 g 3  . levalbuterol (XOPENEX HFA) 45 MCG/ACT inhaler Inhale 2 puffs four times a day as needed    . metroNIDAZOLE (METROGEL VAGINAL) 0.75 % vaginal gel Place 1 applicatorful vaginally BID for five days. 70 g 0  . montelukast (SINGULAIR) 10 MG tablet Take 1 tablet (10 mg total) by mouth at bedtime. 1 tablet once a day 90 tablet 3  . omeprazole (PRILOSEC) 10 MG capsule TAKE 1 CAPSULE BY MOUTH  DAILY 90 capsule 3  . pregabalin (LYRICA) 100 MG capsule Take 1 capsule (100 mg total) by mouth 2 (two) times daily. 180 capsule 1  . rosuvastatin (CRESTOR) 20 MG tablet TAKE 1 TABLET BY MOUTH  DAILY 90 tablet 3  . sodium chloride (OCEAN) 0.65 % nasal spray Place 1 spray into the nose as needed for congestion.    . vitamin E 400 UNIT capsule Take 400 Units by mouth daily.     No current facility-administered medications for this visit.    PHYSICAL EXAMINATION: ECOG PERFORMANCE STATUS: 0 - Asymptomatic  BP 124/68 (BP Location: Right Arm, Patient Position: Sitting, Cuff Size: Large)   Pulse 71   Temp 98 F (36.7 C) (Tympanic)   Resp 16   Ht _0  (1.626 m)   Wt 225 lb (102.1 kg)   SpO2 98%   BMI 38.62 kg/m   Filed Weights   12/31/19 1354  Weight: 225 lb (102.1 kg)    Physical Exam HENT:     Head: Normocephalic and atraumatic.     Mouth/Throat:     Pharynx: No oropharyngeal exudate.  Eyes:     Pupils: Pupils  are equal, round, and reactive to light.  Cardiovascular:     Rate and Rhythm: Normal rate and regular rhythm.  Pulmonary:     Effort: No respiratory distress.     Breath sounds: No wheezing.  Abdominal:     General: Bowel sounds are normal. There is no distension.     Palpations: Abdomen is soft. There is no mass.     Tenderness: There is no abdominal tenderness. There is no guarding or rebound.  Musculoskeletal:        General:  No tenderness. Normal range of motion.     Cervical back: Normal range of motion and neck supple.  Skin:    General: Skin is warm.     Comments: Right breast exam (in the presence of nurse)- no unusual skin changes or dominant masses felt. Surgical scars noted.  Left-mastectomy noted no lumps or bumps.   Neurological:     Mental Status: She is alert and oriented to person, place, and time.  Psychiatric:        Mood and Affect: Affect normal.     LABORATORY DATA:  I have reviewed the data as listed    Component Value Date/Time   NA 139 12/31/2019 1339   NA 142 11/03/2011 0000   K 4.2 12/31/2019 1339   CL 104 12/31/2019 1339   CO2 28 12/31/2019 1339   GLUCOSE 102 (H) 12/31/2019 1339   BUN 12 12/31/2019 1339   BUN 14 11/03/2011 0000   CREATININE 0.80 12/31/2019 1339   CREATININE 1.04 12/18/2012 1406   CALCIUM 9.1 12/31/2019 1339   PROT 7.0 12/31/2019 1339   PROT 7.0 12/18/2012 1406   ALBUMIN 4.0 12/31/2019 1339   ALBUMIN 3.7 12/18/2012 1406   AST 23 12/31/2019 1339   AST 23 12/18/2012 1406   ALT 22 12/31/2019 1339   ALT 32 12/18/2012 1406   ALKPHOS 61 12/31/2019 1339   ALKPHOS 91 12/18/2012 1406   BILITOT 0.5 12/31/2019 1339   BILITOT 0.6 12/18/2012 1406   GFRNONAA >60 12/31/2019 1339   GFRNONAA 58 (L) 12/18/2012 1406   GFRAA >60 12/31/2019 1339   GFRAA >60 12/18/2012 1406    No results found for: SPEP, UPEP  Lab Results  Component Value Date   WBC 6.8 12/31/2019   NEUTROABS 3.7 12/31/2019   HGB 11.8 (L) 12/31/2019   HCT 34.4 (L)  12/31/2019   MCV 89.8 12/31/2019   PLT 166 12/31/2019      Chemistry      Component Value Date/Time   NA 139 12/31/2019 1339   NA 142 11/03/2011 0000   K 4.2 12/31/2019 1339   CL 104 12/31/2019 1339   CO2 28 12/31/2019 1339   BUN 12 12/31/2019 1339   BUN 14 11/03/2011 0000   CREATININE 0.80 12/31/2019 1339   CREATININE 1.04 12/18/2012 1406   GLU 95 11/03/2011 0000      Component Value Date/Time   CALCIUM 9.1 12/31/2019 1339   ALKPHOS 61 12/31/2019 1339   ALKPHOS 91 12/18/2012 1406   AST 23 12/31/2019 1339   AST 23 12/18/2012 1406   ALT 22 12/31/2019 1339   ALT 32 12/18/2012 1406   BILITOT 0.5 12/31/2019 1339   BILITOT 0.6 12/18/2012 1406       RADIOGRAPHIC STUDIES: I have personally reviewed the radiological images as listed and agreed with the findings in the report. No results found.   ASSESSMENT & PLAN:  Carcinoma of overlapping sites of left breast in female, estrogen receptor positive ([place]) Left breast Ca- stage I ER/PR positive HER-2/neu negative status post mastectomy currently on adjuvant aromatase inhibitor.  STABLE  Mammogram-July 2021 normal.  # On extended AI therapy [aug 2022].  Continue the same.  # BMD- may 2018-normal.  Continue calcium plus vitamin D.  Recommend repeating BMD in the next few weeks.  # Chronic peripheral neuropathy stable. Continue Lyrica 48m BID- STABLE.  # Mild anemia-hemoglobin 11.3; check iron studies/ferritin [multiple colonoscopies in the past].  Repeat blood work-if no iron deficiency noted  # DISPOSITION:check iron studies/ferrtin/  will call # BMD 1-2 weeks #  follow up in 12 months- MD / labs-cbc/cmp; mammogram July 2022.    Orders Placed This Encounter  Procedures  . DG Bone Density    Standing Status:   Future    Standing Expiration Date:   12/30/2020    Order Specific Question:   Reason for Exam (SYMPTOM  OR DIAGNOSIS REQUIRED)    Answer:   osteoporosis screening    Order Specific Question:   Preferred imaging  location?    Answer:   [facility]  . Ferritin    Standing Status:   Future    Number of Occurrences:   1    Standing Expiration Date:   12/30/2020  . Iron and TIBC    Standing Status:   Future    Number of Occurrences:   1    Standing Expiration Date:   12/30/2020  . Comprehensive metabolic panel    Standing Status:   Future    Standing Expiration Date:   06/30/2021  . CBC with Differential    Standing Status:   Future    Standing Expiration Date:   06/30/2021   All questions were answered. The patient knows to call the clinic with any problems, questions or concerns.      Cammie Sickle, MD 12/31/2019 2:36 PM

## 2019-12-31 NOTE — Assessment & Plan Note (Addendum)
Left breast Ca- stage I ER/PR positive HER-2/neu negative status post mastectomy currently on adjuvant aromatase inhibitor.  STABLE  Mammogram-July 2021 normal.  # On extended AI therapy [aug 2022].  Continue the same.  # BMD- may 2018-normal.  Continue calcium plus vitamin D.  Recommend repeating BMD in the next few weeks.  # Chronic peripheral neuropathy stable. Continue Lyrica $RemoveBeforeD'75mg'[id number]$  BID- STABLE.  # Mild anemia-hemoglobin 11.3; check iron studies/ferritin [multiple colonoscopies in the past].  Repeat blood work-if no iron deficiency noted  # DISPOSITION:check iron studies/ferrtin/ will call # BMD 1-2 weeks #  follow up in 12 months- MD / labs-cbc/cmp; mammogram July 2022.

## 2020-01-02 ENCOUNTER — Telehealth: Payer: Self-pay | Admitting: Internal Medicine

## 2020-01-02 DIAGNOSIS — Z170 Estrogen receptor positive status [ER+]: Secondary | ICD-10-CM

## 2020-01-02 NOTE — Addendum Note (Signed)
Addended by: Delice Bison E on: 01/02/2020 08:20 AM   Modules accepted: Orders

## 2020-01-02 NOTE — Telephone Encounter (Signed)
Spoke to patient regarding the results of iron studies-normal.  Continue oral iron.  Schedule lab in 3 months-please order- CBC/LDH/folic acid/ S97/

## 2020-01-02 NOTE — Telephone Encounter (Signed)
Lab orders placed.  

## 2020-01-08 ENCOUNTER — Telehealth: Payer: Self-pay | Admitting: Internal Medicine

## 2020-01-08 NOTE — Telephone Encounter (Signed)
Lft vm for pt to call ofc to sch [place].

## 2020-01-22 ENCOUNTER — Ambulatory Visit
Admission: RE | Admit: 2020-01-22 | Discharge: 2020-01-22 | Disposition: A | Discharge status: Home / Self Care | Payer: Medicare Other | Source: Ambulatory Visit | Attending: Internal Medicine | Admitting: Internal Medicine

## 2020-01-22 DIAGNOSIS — Z853 Personal history of malignant neoplasm of breast: Secondary | ICD-10-CM | POA: Diagnosis not present

## 2020-01-22 DIAGNOSIS — Z13820 Encounter for screening for osteoporosis: Secondary | ICD-10-CM | POA: Insufficient documentation

## 2020-01-22 DIAGNOSIS — Z9221 Personal history of antineoplastic chemotherapy: Secondary | ICD-10-CM | POA: Diagnosis not present

## 2020-01-22 DIAGNOSIS — C50812 Malignant neoplasm of overlapping sites of left female breast: Secondary | ICD-10-CM | POA: Diagnosis not present

## 2020-01-22 DIAGNOSIS — Z923 Personal history of irradiation: Secondary | ICD-10-CM | POA: Diagnosis not present

## 2020-01-22 DIAGNOSIS — Z780 Asymptomatic menopausal state: Secondary | ICD-10-CM | POA: Diagnosis not present

## 2020-01-22 DIAGNOSIS — M8588 Other specified disorders of bone density and structure, other site: Secondary | ICD-10-CM | POA: Diagnosis not present

## 2020-01-22 DIAGNOSIS — Z170 Estrogen receptor positive status [ER+]: Secondary | ICD-10-CM | POA: Diagnosis not present

## 2020-01-29 ENCOUNTER — Telehealth: Payer: Self-pay | Admitting: Internal Medicine

## 2020-01-29 NOTE — Telephone Encounter (Signed)
On 9/24-spoke to patient regarding results of her bone density-normal; iron studies normal.  Follow-up as planned/no new recommendations.

## 2020-02-15 ENCOUNTER — Ambulatory Visit: Payer: Medicare Other | Admitting: Internal Medicine

## 2020-02-26 ENCOUNTER — Other Ambulatory Visit: Payer: Self-pay | Admitting: Internal Medicine

## 2020-03-26 DIAGNOSIS — C50912 Malignant neoplasm of unspecified site of left female breast: Secondary | ICD-10-CM | POA: Diagnosis not present

## 2020-03-31 ENCOUNTER — Other Ambulatory Visit: Payer: Self-pay | Admitting: *Deleted

## 2020-03-31 ENCOUNTER — Telehealth: Payer: Self-pay

## 2020-03-31 ENCOUNTER — Telehealth: Payer: Self-pay | Admitting: *Deleted

## 2020-03-31 DIAGNOSIS — C50812 Malignant neoplasm of overlapping sites of left female breast: Secondary | ICD-10-CM

## 2020-03-31 NOTE — Telephone Encounter (Signed)
Patient called reporting that she was told that she would need labs checked, but then got a call that everything is alright, so she is asking if she still needs to have labs drawn. Please advise

## 2020-03-31 NOTE — Telephone Encounter (Signed)
Pt would like a call back about breast reduction. I offered pt an appt but she did not want to take one. She wants a call back from PCP.

## 2020-03-31 NOTE — Telephone Encounter (Signed)
Spoke with patient. Discussed that in Sept, Dr. B ordered additional labs to be drawn again in 3 months. Her iron stores were stable in August, but Dr. B would like her cbc and additional labs on her 12/1 apt. Patient gave verbal understanding of the plan of care. She requested that her lab apt be moved to 1:95 pm due to conflict in apt times. Apt times adjusted per request.

## 2020-03-31 NOTE — Telephone Encounter (Signed)
LM to call back. Needs appt to discuss with PCP

## 2020-04-01 ENCOUNTER — Other Ambulatory Visit: Payer: Self-pay | Admitting: *Deleted

## 2020-04-01 MED ORDER — PREGABALIN 100 MG PO CAPS
100.0000 mg | ORAL_CAPSULE | Freq: Two times a day (BID) | ORAL | 1 refills | Status: DC
Start: 2020-04-01 — End: 2020-04-02

## 2020-04-02 ENCOUNTER — Inpatient Hospital Stay: Payer: Medicare Other | Attending: Internal Medicine

## 2020-04-02 ENCOUNTER — Other Ambulatory Visit: Payer: Self-pay | Admitting: *Deleted

## 2020-04-02 ENCOUNTER — Telehealth: Payer: Self-pay | Admitting: *Deleted

## 2020-04-02 DIAGNOSIS — Z170 Estrogen receptor positive status [ER+]: Secondary | ICD-10-CM | POA: Insufficient documentation

## 2020-04-02 DIAGNOSIS — G629 Polyneuropathy, unspecified: Secondary | ICD-10-CM

## 2020-04-02 DIAGNOSIS — R7989 Other specified abnormal findings of blood chemistry: Secondary | ICD-10-CM | POA: Insufficient documentation

## 2020-04-02 DIAGNOSIS — C50812 Malignant neoplasm of overlapping sites of left female breast: Secondary | ICD-10-CM

## 2020-04-02 LAB — CBC WITH DIFFERENTIAL/PLATELET
Abs Immature Granulocytes: 0.02 10*3/uL (ref 0.00–0.07)
Basophils Absolute: 0.1 10*3/uL (ref 0.0–0.1)
Basophils Relative: 1 %
Eosinophils Absolute: 0.3 10*3/uL (ref 0.0–0.5)
Eosinophils Relative: 5 %
HCT: 39.2 % (ref 36.0–46.0)
Hemoglobin: 12.8 g/dL (ref 12.0–15.0)
Immature Granulocytes: 0 %
Lymphocytes Relative: 29 %
Lymphs Abs: 1.8 10*3/uL (ref 0.7–4.0)
MCH: 30.2 pg (ref 26.0–34.0)
MCHC: 32.7 g/dL (ref 30.0–36.0)
MCV: 92.5 fL (ref 80.0–100.0)
Monocytes Absolute: 0.5 10*3/uL (ref 0.1–1.0)
Monocytes Relative: 7 %
Neutro Abs: 3.7 10*3/uL (ref 1.7–7.7)
Neutrophils Relative %: 58 %
Platelets: 198 10*3/uL (ref 150–400)
RBC: 4.24 MIL/uL (ref 3.87–5.11)
RDW: 12.6 % (ref 11.5–15.5)
WBC: 6.4 10*3/uL (ref 4.0–10.5)
nRBC: 0 % (ref 0.0–0.2)

## 2020-04-02 LAB — FOLATE: Folate: 27 ng/mL (ref >5.9)

## 2020-04-02 LAB — LACTATE DEHYDROGENASE: LDH: 162 U/L (ref 98–192)

## 2020-04-02 LAB — VITAMIN B12: Vitamin B-12: 1821 pg/mL — ABNORMAL HIGH (ref 180–914)

## 2020-04-02 MED ORDER — PREGABALIN 100 MG PO CAPS: 100.0000 mg | ORAL_CAPSULE | Freq: Two times a day (BID) | ORAL | 2 refills | Status: DC

## 2020-04-02 NOTE — Telephone Encounter (Signed)
Scheduled

## 2020-04-02 NOTE — Telephone Encounter (Signed)
lyrica prescription Faxed to [facility] - for [employer] RF

## 2020-04-04 ENCOUNTER — Other Ambulatory Visit: Payer: Self-pay | Admitting: *Deleted

## 2020-04-04 ENCOUNTER — Other Ambulatory Visit: Payer: Self-pay | Admitting: Internal Medicine

## 2020-04-04 DIAGNOSIS — G629 Polyneuropathy, unspecified: Secondary | ICD-10-CM

## 2020-04-04 MED ORDER — PREGABALIN 100 MG PO CAPS: 100.0000 mg | ORAL_CAPSULE | Freq: Two times a day (BID) | ORAL | 2 refills | Status: DC

## 2020-04-04 NOTE — Telephone Encounter (Signed)
Pharmacy did not receive prescription that was faxed for her Lyrica. I confirmed with pharmacy that they did not receive it

## 2020-04-08 ENCOUNTER — Encounter: Payer: Self-pay | Admitting: Internal Medicine

## 2020-04-08 ENCOUNTER — Telehealth (INDEPENDENT_AMBULATORY_CARE_PROVIDER_SITE_OTHER): Payer: Medicare Other | Admitting: Internal Medicine

## 2020-04-08 ENCOUNTER — Other Ambulatory Visit: Payer: Self-pay

## 2020-04-08 DIAGNOSIS — C50812 Malignant neoplasm of overlapping sites of left female breast: Secondary | ICD-10-CM

## 2020-04-08 DIAGNOSIS — Z9109 Other allergy status, other than to drugs and biological substances: Secondary | ICD-10-CM | POA: Diagnosis not present

## 2020-04-08 DIAGNOSIS — R739 Hyperglycemia, unspecified: Secondary | ICD-10-CM | POA: Diagnosis not present

## 2020-04-08 DIAGNOSIS — K219 Gastro-esophageal reflux disease without esophagitis: Secondary | ICD-10-CM

## 2020-04-08 DIAGNOSIS — J4521 Mild intermittent asthma with (acute) exacerbation: Secondary | ICD-10-CM

## 2020-04-08 DIAGNOSIS — N6489 Other specified disorders of breast: Secondary | ICD-10-CM

## 2020-04-08 DIAGNOSIS — E785 Hyperlipidemia, unspecified: Secondary | ICD-10-CM | POA: Diagnosis not present

## 2020-04-08 DIAGNOSIS — Z170 Estrogen receptor positive status [ER+]: Secondary | ICD-10-CM

## 2020-04-08 NOTE — Progress Notes (Signed)
Patient ID: Robin Hill, female   DOB: 02/02/1951, 69 y.o.   MRN: [id number]   Virtual Visit via telephone Note  This visit type was conducted due to national recommendations for restrictions regarding the COVID-19 pandemic (e.g. social distancing).  This format is felt to be most appropriate for this patient at this time.  All issues noted in this document were discussed and addressed.  No physical exam was performed (except for noted visual exam findings with Video Visits).   I connected with Iantha Fallen  telephone and verified that I am speaking with the correct person using two identifiers. Location patient: home Location provider: work Persons participating in the telephone visit: patient, provider  The limitations, risks, security and privacy concerns of performing an evaluation and management service by telephone and the availability of in person appointments have been discussed.  It has also been discussed with the patient that there may be a patient responsible charge related to this service. The patient expressed understanding and agreed to proceed.   Reason for visit: work in appt  HPI: Work in to discuss possible breast reduction.  She reports - large breasts - feel heavy.  Feels affects her posture and balance.  Was interested in possibly seeing a surgeon to discuss breast reduction.  She is trying to stay active.  She is back to the gym - in water.  Did have a recent fall approximately three weeks ago.  She was stooping and hit a sharp garden tool - went into leg - right calf.  No head injury.  Wrapped and taking tylenol.  Is better.  Does not feel needs to be evaluated.  Taking her allergy medications.  Stable.  No chest pain or sob reported. No abdominal pain or bowel change reported.     ROS: See pertinent positives and negatives per HPI.  Past Medical History:  Diagnosis Date  . Allergic rhinitis   . Allergy   . Asthma   . Breast cancer ([place]) 2012   mastectomy with  chemo and rad tx  . Diverticulosis   . Dysmenorrhea   . Hypercholesterolemia     Past Surgical History:  Procedure Laterality Date  . BREAST BIOPSY Right 2012   benign  . BREAST SURGERY  2011   breast cancer  . CESAREAN SECTION  1970  . [place]  . CESAREAN SECTION  1987  . COLONOSCOPY WITH PROPOFOL N/A 10/03/2015   Procedure: COLONOSCOPY WITH PROPOFOL;  Surgeon: Manya Silvas, MD;  Location: [facility] ENDOSCOPY;  Service: Endoscopy;  Laterality: N/A;  . MASTECTOMY Left 2012   with chemo and rad tx  . NASAL SINUS SURGERY  07/2001    Family History  Problem Relation Age of Onset  . Cirrhosis Brother        liver  . Breast cancer Paternal Aunt        2 aunts    SOCIAL HX: reviewed.    Current Outpatient Medications:  .  Albuterol Sulfate (PROAIR HFA IN), 90 mcg. 2 puffs using inhaler four times a day as needed., Disp: , Rfl:  .  anastrozole (ARIMIDEX) 1 MG tablet, TAKE 1 TABLET BY MOUTH  DAILY, Disp: 90 tablet, Rfl: 3 .  azelastine (ASTELIN) 0.1 % nasal spray, Place 2 sprays into both nostrils 2 (two) times daily. 137 mcg. Spray 2 sprays into both nostrils twice a day, Disp: 90 mL, Rfl: 1 .  b complex vitamins capsule, Take 1 capsule by mouth daily., Disp: ,  Rfl:  .  budesonide-formoterol (SYMBICORT) 160-4.5 MCG/ACT inhaler, USE 2 INHALATIONS BY MOUTH  TWICE DAILY, Disp: 30.6 g, Rfl: 3 .  cholecalciferol (VITAMIN D) 400 UNITS TABS, Take 400 Units by mouth daily. Chewable, Disp: , Rfl:  .  Cyanocobalamin (VITAMIN B12 PO), Take 500 mcg by mouth daily. Administer 1 tablet by mouth once a day, Disp: , Rfl:  .  fluticasone (FLONASE) 50 MCG/ACT nasal spray, USE 1 SPRAY INTO EACH  NOSTRIL TWICE A DAY, Disp: 48 g, Rfl: 3 .  levalbuterol (XOPENEX HFA) 45 MCG/ACT inhaler, Inhale 2 puffs four times a day as needed, Disp: , Rfl:  .  montelukast (SINGULAIR) 10 MG tablet, Take 1 tablet (10 mg total) by mouth at bedtime. 1 tablet once a day, Disp: 90 tablet, Rfl: 3 .  omeprazole  (PRILOSEC) 10 MG capsule, TAKE 1 CAPSULE BY MOUTH  DAILY, Disp: 90 capsule, Rfl: 3 .  pregabalin (LYRICA) 100 MG capsule, Take 1 capsule (100 mg total) by mouth 2 (two) times daily., Disp: 180 capsule, Rfl: 2 .  rosuvastatin (CRESTOR) 20 MG tablet, TAKE 1 TABLET BY MOUTH  DAILY, Disp: 90 tablet, Rfl: 3 .  vitamin E 400 UNIT capsule, Take 400 Units by mouth daily., Disp: , Rfl:   EXAM:  GENERAL: alert.  Sounds to be in no acute distress.  Answering questions appropriately.   PSYCH/NEURO: pleasant and cooperative, no obvious depression or anxiety, speech and thought processing grossly intact  ASSESSMENT AND PLAN:  Discussed the following assessment and plan:  Problem List Items Addressed This Visit    Pendulous breast    Reports breasts - heavy.  Feels is affecting her posture and balance. Discussed.  Expressed interest in possible breast reduction.  She will notify me if wants to pursue surgery referral.       Hyperglycemia    Low carb diet and exercise.  Follow met b and a1c.       HLD (hyperlipidemia)    On crestor.  Low cholesterol diet and exercise.  Follow lipid panel and liver function tests.        GERD (gastroesophageal reflux disease)    Controlled on omeprazole.       Environmental allergies    Continue singulair.  Controlled.       Carcinoma of overlapping sites of left breast in female, estrogen receptor positive ([place])    Followed by oncology.  Continue arimidex.        Asthma    Breathing stable.  Continue singulair.  xopenex prn.  Follow.           I discussed the assessment and treatment plan with the patient. The patient was provided an opportunity to ask questions and all were answered. The patient agreed with the plan and demonstrated an understanding of the instructions.   The patient was advised to call back or seek an in-person evaluation if the symptoms worsen or if the condition fails to improve as anticipated.  I provided 23 minutes of  non-face-to-face time during this encounter.   Einar Pheasant, MD

## 2020-04-13 ENCOUNTER — Encounter: Payer: Self-pay | Admitting: Internal Medicine

## 2020-04-13 DIAGNOSIS — N6489 Other specified disorders of breast: Secondary | ICD-10-CM | POA: Insufficient documentation

## 2020-04-13 NOTE — Assessment & Plan Note (Signed)
Controlled on omeprazole.   

## 2020-04-13 NOTE — Assessment & Plan Note (Signed)
On crestor.  Low cholesterol diet and exercise.  Follow lipid panel and liver function tests.   

## 2020-04-13 NOTE — Assessment & Plan Note (Signed)
Reports breasts - heavy.  Feels is affecting her posture and balance. Discussed.  Expressed interest in possible breast reduction.  She will notify me if wants to pursue surgery referral.

## 2020-04-13 NOTE — Assessment & Plan Note (Signed)
Low carb diet and exercise.  Follow met b and a1c.  

## 2020-04-13 NOTE — Assessment & Plan Note (Signed)
Followed by oncology.  Continue arimidex.   

## 2020-04-13 NOTE — Assessment & Plan Note (Signed)
Continue singulair.  Controlled.

## 2020-04-13 NOTE — Assessment & Plan Note (Addendum)
Breathing stable.  Continue singulair.  xopenex prn.  Follow.

## 2020-05-13 ENCOUNTER — Ambulatory Visit (INDEPENDENT_AMBULATORY_CARE_PROVIDER_SITE_OTHER): Payer: Medicare Other

## 2020-05-13 VITALS — Ht 64.0 in | Wt 223.0 lb

## 2020-05-13 DIAGNOSIS — Z0000 Encounter for general adult medical examination without abnormal findings: Secondary | ICD-10-CM

## 2020-05-13 NOTE — Progress Notes (Signed)
Subjective:   Robin Hill is a 70 y.o. female who presents for Medicare Annual (Subsequent) preventive examination.  Review of Systems    No ROS.  Medicare Wellness Virtual Visit.     Cardiac Risk Factors include: advanced age (>32men, >92 women)     Objective:    Today's Vitals   05/13/20 1301  Weight: 223 lb (101.2 kg)  Height: 5\' 4"  (1.626 m)   Body mass index is 38.28 kg/m.  Advanced Directives 05/13/2020 12/29/2018 06/05/2018 12/28/2017 06/02/2017 12/27/2016 06/28/2016  Does Patient Have a Medical Advance Directive? Yes Yes No Yes Yes Yes Yes  Type of Paramedic of [place];Living will [place];Living will - Living will;Healthcare Power of [place];Living will [place];Living will [place]  Does patient want to make changes to medical advance directive? No - Patient declined No - Patient declined - No - Patient declined No - Patient declined - -  Copy of [place] in Chart? Yes - validated most recent copy scanned in chart (See row information) No - copy requested - Yes Yes - Yes  Would patient like information on creating a medical advance directive? - No - Patient declined No - Patient declined - - - -    Current Medications (verified) Outpatient Encounter Medications as of 05/13/2020  Medication Sig  . Albuterol Sulfate (PROAIR HFA IN) 90 mcg. 2 puffs using inhaler four times a day as needed.  Marland Kitchen anastrozole (ARIMIDEX) 1 MG tablet TAKE 1 TABLET BY MOUTH  DAILY  . azelastine (ASTELIN) 0.1 % nasal spray Place 2 sprays into both nostrils 2 (two) times daily. 137 mcg. Spray 2 sprays into both nostrils twice a day  . b complex vitamins capsule Take 1 capsule by mouth daily.  . budesonide-formoterol (SYMBICORT) 160-4.5 MCG/ACT inhaler USE 2 INHALATIONS BY MOUTH  TWICE DAILY  . cholecalciferol (VITAMIN D) 400 UNITS TABS Take 400 Units by mouth daily.  Chewable  . Cyanocobalamin (VITAMIN B12 PO) Take 500 mcg by mouth daily. Administer 1 tablet by mouth once a day  . fluticasone (FLONASE) 50 MCG/ACT nasal spray USE 1 SPRAY INTO EACH  NOSTRIL TWICE A DAY  . levalbuterol (XOPENEX HFA) 45 MCG/ACT inhaler Inhale 2 puffs four times a day as needed  . montelukast (SINGULAIR) 10 MG tablet Take 1 tablet (10 mg total) by mouth at bedtime. 1 tablet once a day  . omeprazole (PRILOSEC) 10 MG capsule TAKE 1 CAPSULE BY MOUTH  DAILY  . pregabalin (LYRICA) 100 MG capsule Take 1 capsule (100 mg total) by mouth 2 (two) times daily.  . rosuvastatin (CRESTOR) 20 MG tablet TAKE 1 TABLET BY MOUTH  DAILY  . vitamin E 400 UNIT capsule Take 400 Units by mouth daily.   No facility-administered encounter medications on file as of 05/13/2020.    Allergies (verified) Patient has no known allergies.   History: Past Medical History:  Diagnosis Date  . Allergic rhinitis   . Allergy   . Asthma   . Breast cancer ([place]) 2012   mastectomy with chemo and rad tx  . Diverticulosis   . Dysmenorrhea   . Hypercholesterolemia    Past Surgical History:  Procedure Laterality Date  . BREAST BIOPSY Right 2012   benign  . BREAST SURGERY  2011   breast cancer  . CESAREAN SECTION  1970  . [place]  . CESAREAN SECTION  1987  . COLONOSCOPY  WITH PROPOFOL N/A 10/03/2015   Procedure: COLONOSCOPY WITH PROPOFOL;  Surgeon: Manya Silvas, MD;  Location: [facility] ENDOSCOPY;  Service: Endoscopy;  Laterality: N/A;  . MASTECTOMY Left 2012   with chemo and rad tx  . NASAL SINUS SURGERY  07/2001   Family History  Problem Relation Age of Onset  . Cirrhosis Brother        liver  . Breast cancer Paternal Aunt        2 aunts   Social History   Socioeconomic History  . Marital status: Divorced    Spouse name: Not on file  . Number of children: Not on file  . Years of education: Not on file  . Highest education level: Not on file  Occupational History  . Not on file   Tobacco Use  . Smoking status: Former Smoker    Types: Cigarettes  . Smokeless tobacco: Never Used  Vaping Use  . Vaping Use: Never used  Substance and Sexual Activity  . Alcohol use: Yes    Alcohol/week: 1.0 standard drink    Types: 1 Glasses of wine per week    Comment: OCC  . Drug use: No  . Sexual activity: Never  Other Topics Concern  . Not on file  Social History Narrative  . Not on file   Social Determinants of Health   Financial Resource Strain: Low Risk   . Difficulty of Paying Living Expenses: Not hard at all  Food Insecurity: No Food Insecurity  . Worried About Charity fundraiser in the Last Year: Never true  . Ran Out of Food in the Last Year: Never true  Transportation Needs: No Transportation Needs  . Lack of Transportation (Medical): No  . Lack of Transportation (Non-Medical): No  Physical Activity: Not on file  Stress: No Stress Concern Present  . Feeling of Stress : Not at all  Social Connections: Not on file    Tobacco Counseling Counseling given: Not Answered   Clinical Intake:  Pre-visit preparation completed: Yes        Diabetes: No  How often do you need to have someone help you when you read instructions, pamphlets, or other written materials from your doctor or pharmacy?: 1 - Never  Interpreter Needed?: No      Activities of Daily Living In your present state of health, do you have any difficulty performing the following activities: 05/13/2020  Hearing? N  Vision? N  Difficulty concentrating or making decisions? N  Walking or climbing stairs? N  Dressing or bathing? N  Doing errands, shopping? N  Preparing Food and eating ? N  Using the Toilet? N  In the past six months, have you accidently leaked urine? N  Do you have problems with loss of bowel control? N  Managing your Medications? N  Managing your Finances? N  Housekeeping or managing your Housekeeping? N  Some recent data might be hidden    Patient Care  Team: Einar Pheasant, MD as PCP - General (Internal Medicine) Cammie Sickle, MD as Consulting Physician (Hematology and Oncology)  Indicate any recent Medical Services you may have received from other than [facility] providers in the past year (date may be approximate).     Assessment:   This is a routine wellness examination for Robin Hill.  I connected with Robin Hill today by telephone and verified that I am speaking with the correct person using two identifiers. Location patient: home Location provider: work Persons participating in the virtual visit: patient, Marine scientist.  I discussed the limitations, risks, security and privacy concerns of performing an evaluation and management service by telephone and the availability of in person appointments. The patient expressed understanding and verbally consented to this telephonic visit.    Interactive audio and video telecommunications were attempted between this provider and patient, however failed, due to patient having technical difficulties OR patient did not have access to video capability.  We continued and completed visit with audio only.  Some vital signs may be absent or patient reported.   Hearing/Vision screen  Hearing Screening   125Hz  250Hz  500Hz  1000Hz  2000Hz  3000Hz  4000Hz  6000Hz  8000Hz   Right ear:           Left ear:           Comments: Patient is able to hear conversational tones without difficulty.  No issues reported.  Vision Screening Comments: Followed by [facility]  Wears corrective lenses  Visual acuity not assessed per patient preference since they have regular follow up with the ophthalmologist  Dietary issues and exercise activities discussed: Current Exercise Habits: Home exercise routine, Type of exercise: calisthenics (water aerobics 2 days), Time (Minutes): 45, Frequency (Times/Week): 3, Weekly Exercise (Minutes/Week): 135, Intensity: Mild  Regular diet Good water intake  Goals      Patient Stated    .  Weight (lb) < 200 lb (90.7 kg) (pt-stated)      Depression Screen PHQ 2/9 Scores 05/13/2020 11/08/2019 06/08/2019 06/05/2018 06/02/2017 05/28/2016 02/17/2016  PHQ - 2 Score 0 0 0 0 0 0 0    Fall Risk Fall Risk  05/13/2020 10/26/2019 06/08/2019 06/05/2018 06/02/2017  Falls in the past year? 1 0 0 0 No  Number falls in past yr: 0 0 - - -  Injury with Fall? - 0 - - -  Follow up Falls evaluation completed Falls evaluation completed Falls evaluation completed - -    FALL RISK PREVENTION PERTAINING TO THE HOME: Handrails in use when climbing stairs? Yes Home free of loose throw rugs in walkways, pet beds, electrical cords, etc? Yes  Adequate lighting in your home to reduce risk of falls? Yes   ASSISTIVE DEVICES UTILIZED TO PREVENT FALLS: Use of a cane, walker or w/c? No   TIMED UP AND GO: Was the test performed? No . Virtual visit.   Cognitive Function: MMSE - Mini Mental State Exam 06/05/2018 06/02/2017  Orientation to time 5 5  Orientation to Place 5 5  Registration 3 3  Attention/ Calculation 4 5  Recall 2 3  Language- name 2 objects 2 2  Language- repeat 1 1  Language- follow 3 step command 3 3  Language- read & follow direction 1 1  Write a sentence 1 1  Copy design 1 1  Total score 28 30     6CIT Screen 05/28/2016  What Year? 0 points  What month? 0 points  What time? 0 points  Count back from 20 0 points  Months in reverse 0 points  Repeat phrase 0 points  Total Score 0    Immunizations Immunization History  Administered Date(s) Administered  . Influenza Split 02/22/2014  . Influenza Whole 02/17/2017  . Influenza, High Dose Seasonal PF 01/10/2018, 12/29/2018  . Influenza-Unspecified 02/24/2015, 12/09/2015, 01/02/2020  . PFIZER SARS-COV-2 Vaccination 06/26/2019, 07/17/2019, 02/21/2020  . Pneumococcal Conjugate-13 06/05/2016  . Pneumococcal Polysaccharide-23 02/24/2015  . Tdap 12/09/2015  . Zoster Recombinat (Shingrix) 08/13/2016, 12/29/2018   Health  Maintenance Health Maintenance  Topic Date Due  . PNA vac Low  Risk Adult (2 of 2 - PPSV23) 05/13/2021 (Originally 02/24/2020)  . COVID-19 Vaccine (4 - Booster for Pfizer series) 08/21/2020  . MAMMOGRAM  11/18/2020  . COLONOSCOPY (Pts 45-53yrs Insurance coverage will need to be confirmed)  10/02/2025  . TETANUS/TDAP  12/08/2025  . INFLUENZA VACCINE  Completed  . DEXA SCAN  Completed  . Hepatitis C Screening  Completed    Colorectal cancer screening: Type of screening: Colonoscopy. Completed 10/03/15. Repeat every 10 years  Mammogram status: Completed 11/19/19. Repeat every year   Bone Density status: Completed 01/22/20. Results reflect: Bone density results: NORMAL. Repeat every 5 years.  Lung Cancer Screening: (Low Dose CT Chest recommended if Age 38-80 years, 30 pack-year currently smoking OR have quit w/in 15years.) does not qualify.   Hepatitis C Screening: Completed 06/02/17.  Vision Screening: Recommended annual ophthalmology exams for early detection of glaucoma and other disorders of the eye. Is the patient up to date with their annual eye exam?  Yes  Who is the provider or what is the name of the office in which the patient attends annual eye exams? [facility]. Considering [facility].   Dental Screening: Recommended annual dental exams for proper oral hygiene.  Community Resource Referral / Chronic Care Management: CRR required this visit?  No   CCM required this visit?  No      Plan:   Keep all routine maintenance appointments.   Next scheduled lab 06/10/20 @ 8:30   Follow up 06/12/20 @ 2:30  I have personally reviewed and noted the following in the patient's chart:   . Medical and social history . Use of alcohol, tobacco or illicit drugs  . Current medications and supplements . Functional ability and status . Nutritional status . Physical activity . Advanced directives . List of other physicians . Hospitalizations, surgeries, and ER  visits in previous 12 months . Vitals . Screenings to include cognitive, depression, and falls . Referrals and appointments  In addition, I have reviewed and discussed with patient certain preventive protocols, quality metrics, and best practice recommendations. A written personalized care plan for preventive services as well as general preventive health recommendations were provided to patient via mychart.     Varney Biles, LPN   [id number]

## 2020-05-13 NOTE — Patient Instructions (Addendum)
[name] , Thank you for taking time to come for your Medicare Wellness Visit. I appreciate your ongoing commitment to your health goals. Please review the following plan we discussed and let me know if I can assist you in the future.   These are the goals we discussed: Goals      Patient Stated   .  Weight (lb) < 200 lb (90.7 kg) (pt-stated)       This is a list of the screening recommended for you and due dates:  Health Maintenance  Topic Date Due  . Pneumonia vaccines (2 of 2 - PPSV23) 05/13/2021*  . COVID-19 Vaccine (4 - Booster for Pfizer series) 08/21/2020  . Mammogram  11/18/2020  . Colon Cancer Screening  10/02/2025  . Tetanus Vaccine  12/08/2025  . Flu Shot  Completed  . DEXA scan (bone density measurement)  Completed  .  Hepatitis C: One time screening is recommended by Center for Disease Control  (CDC) for  adults born from [birth year] through 1966.   Completed  *Topic was postponed. The date shown is not the original due date.   Immunizations Immunization History  Administered Date(s) Administered  . Influenza Split 02/22/2014  . Influenza Whole 02/17/2017  . Influenza, High Dose Seasonal PF 01/10/2018, 12/29/2018  . Influenza-Unspecified 02/24/2015, 12/09/2015, 01/02/2020  . PFIZER SARS-COV-2 Vaccination 06/26/2019, 07/17/2019  . Pneumococcal Conjugate-13 06/05/2016  . Pneumococcal Polysaccharide-23 02/24/2015  . Tdap 12/09/2015   Keep all routine maintenance appointments.   Next scheduled lab 06/10/20 @ 8:30   Follow up 06/12/20 @ 2:30  Advanced directives: on file  Conditions/risks identified: none new  Follow up in one year for your annual wellness visit.   Preventive Care 34 Years and Older, Female Preventive care refers to lifestyle choices and visits with your health care provider that can promote health and wellness. What does preventive care include?  A yearly physical exam. This is also called an annual well check.  Dental exams once or twice a  year.  Routine eye exams. Ask your health care provider how often you should have your eyes checked.  Personal lifestyle choices, including:  Daily care of your teeth and gums.  Regular physical activity.  Eating a healthy diet.  Avoiding tobacco and drug use.  Limiting alcohol use.  Practicing safe sex.  Taking low-dose aspirin every day.  Taking vitamin and mineral supplements as recommended by your health care provider. What happens during an annual well check? The services and screenings done by your health care provider during your annual well check will depend on your age, overall health, lifestyle risk factors, and family history of disease. Counseling  Your health care provider may ask you questions about your:  Alcohol use.  Tobacco use.  Drug use.  Emotional well-being.  Home and relationship well-being.  Sexual activity.  Eating habits.  History of falls.  Memory and ability to understand (cognition).  Work and work Statistician.  Reproductive health. Screening  You may have the following tests or measurements:  Height, weight, and BMI.  Blood pressure.  Lipid and cholesterol levels. These may be checked every 5 years, or more frequently if you are over 83 years old.  Skin check.  Lung cancer screening. You may have this screening every year starting at age 63 if you have a 30-pack-year history of smoking and currently smoke or have quit within the past 15 years.  Fecal occult blood test (FOBT) of the stool. You may have this test every  year starting at age 43.  Flexible sigmoidoscopy or colonoscopy. You may have a sigmoidoscopy every 5 years or a colonoscopy every 10 years starting at age 25.  Hepatitis C blood test.  Hepatitis B blood test.  Sexually transmitted disease (STD) testing.  Diabetes screening. This is done by checking your blood sugar (glucose) after you have not eaten for a while (fasting). You may have this done every 1-3  years.  Bone density scan. This is done to screen for osteoporosis. You may have this done starting at age 54.  Mammogram. This may be done every 1-2 years. Talk to your health care provider about how often you should have regular mammograms. Talk with your health care provider about your test results, treatment options, and if necessary, the need for more tests. Vaccines  Your health care provider may recommend certain vaccines, such as:  Influenza vaccine. This is recommended every year.  Tetanus, diphtheria, and acellular pertussis (Tdap, Td) vaccine. You may need a Td booster every 10 years.  Zoster vaccine. You may need this after age 43.  Pneumococcal 13-valent conjugate (PCV13) vaccine. One dose is recommended after age 49.  Pneumococcal polysaccharide (PPSV23) vaccine. One dose is recommended after age 71. Talk to your health care provider about which screenings and vaccines you need and how often you need them. This information is not intended to replace advice given to you by your health care provider. Make sure you discuss any questions you have with your health care provider. Document Released: 05/16/2015 Document Revised: 01/07/2016 Document Reviewed: 02/18/2015 Elsevier Interactive Patient Education  2017 [employer] Prevention in the Home Falls can cause injuries. They can happen to people of all ages. There are many things you can do to make your home safe and to help prevent falls. What can I do on the outside of my home?  Regularly fix the edges of walkways and driveways and fix any cracks.  Remove anything that might make you trip as you walk through a door, such as a raised step or threshold.  Trim any bushes or trees on the path to your home.  Use bright outdoor lighting.  Clear any walking paths of anything that might make someone trip, such as rocks or tools.  Regularly check to see if handrails are loose or broken. Make sure that both sides of any  steps have handrails.  Any raised decks and porches should have guardrails on the edges.  Have any leaves, snow, or ice cleared regularly.  Use sand or salt on walking paths during winter.  Clean up any spills in your garage right away. This includes oil or grease spills. What can I do in the bathroom?  Use night lights.  Install grab bars by the toilet and in the tub and shower. Do not use towel bars as grab bars.  Use non-skid mats or decals in the tub or shower.  If you need to sit down in the shower, use a plastic, non-slip stool.  Keep the floor dry. Clean up any water that spills on the floor as soon as it happens.  Remove soap buildup in the tub or shower regularly.  Attach bath mats securely with double-sided non-slip rug tape.  Do not have throw rugs and other things on the floor that can make you trip. What can I do in the bedroom?  Use night lights.  Make sure that you have a light by your bed that is easy to reach.  Do  not use any sheets or blankets that are too big for your bed. They should not hang down onto the floor.  Have a firm chair that has side arms. You can use this for support while you get dressed.  Do not have throw rugs and other things on the floor that can make you trip. What can I do in the kitchen?  Clean up any spills right away.  Avoid walking on wet floors.  Keep items that you use a lot in easy-to-reach places.  If you need to reach something above you, use a strong step stool that has a grab bar.  Keep electrical cords out of the way.  Do not use floor polish or wax that makes floors slippery. If you must use wax, use non-skid floor wax.  Do not have throw rugs and other things on the floor that can make you trip. What can I do with my stairs?  Do not leave any items on the stairs.  Make sure that there are handrails on both sides of the stairs and use them. Fix handrails that are broken or loose. Make sure that handrails are  as long as the stairways.  Check any carpeting to make sure that it is firmly attached to the stairs. Fix any carpet that is loose or worn.  Avoid having throw rugs at the top or bottom of the stairs. If you do have throw rugs, attach them to the floor with carpet tape.  Make sure that you have a light switch at the top of the stairs and the bottom of the stairs. If you do not have them, ask someone to add them for you. What else can I do to help prevent falls?  Wear shoes that:  Do not have high heels.  Have rubber bottoms.  Are comfortable and fit you well.  Are closed at the toe. Do not wear sandals.  If you use a stepladder:  Make sure that it is fully opened. Do not climb a closed stepladder.  Make sure that both sides of the stepladder are locked into place.  Ask someone to hold it for you, if possible.  Clearly mark and make sure that you can see:  Any grab bars or handrails.  First and last steps.  Where the edge of each step is.  Use tools that help you move around (mobility aids) if they are needed. These include:  Canes.  Walkers.  Scooters.  Crutches.  Turn on the lights when you go into a dark area. Replace any light bulbs as soon as they burn out.  Set up your furniture so you have a clear path. Avoid moving your furniture around.  If any of your floors are uneven, fix them.  If there are any pets around you, be aware of where they are.  Review your medicines with your doctor. Some medicines can make you feel dizzy. This can increase your chance of falling. Ask your doctor what other things that you can do to help prevent falls. This information is not intended to replace advice given to you by your health care provider. Make sure you discuss any questions you have with your health care provider. Document Released: 02/13/2009 Document Revised: 09/25/2015 Document Reviewed: 05/24/2014 Elsevier Interactive Patient Education  2017 [employer].

## 2020-05-21 ENCOUNTER — Other Ambulatory Visit: Payer: Self-pay | Admitting: Internal Medicine

## 2020-06-06 ENCOUNTER — Other Ambulatory Visit: Payer: Medicare Other

## 2020-06-09 ENCOUNTER — Other Ambulatory Visit: Payer: Self-pay

## 2020-06-09 ENCOUNTER — Other Ambulatory Visit (INDEPENDENT_AMBULATORY_CARE_PROVIDER_SITE_OTHER): Payer: Medicare Other

## 2020-06-09 DIAGNOSIS — E785 Hyperlipidemia, unspecified: Secondary | ICD-10-CM | POA: Diagnosis not present

## 2020-06-09 DIAGNOSIS — R739 Hyperglycemia, unspecified: Secondary | ICD-10-CM

## 2020-06-09 LAB — HEPATIC FUNCTION PANEL
ALT: 16 U/L (ref 0–35)
AST: 18 U/L (ref 0–37)
Albumin: 4.3 g/dL (ref 3.5–5.2)
Alkaline Phosphatase: 63 U/L (ref 39–117)
Bilirubin, Direct: 0.1 mg/dL (ref 0.0–0.3)
Total Bilirubin: 0.5 mg/dL (ref 0.2–1.2)
Total Protein: 6.9 g/dL (ref 6.0–8.3)

## 2020-06-09 LAB — LIPID PANEL
Cholesterol: 202 mg/dL — ABNORMAL HIGH (ref 0–200)
HDL: 77.1 mg/dL (ref >39.00)
LDL Cholesterol: 111 mg/dL — ABNORMAL HIGH (ref 0–99)
NonHDL: 125.22
Total CHOL/HDL Ratio: 3
Triglycerides: 73 mg/dL (ref 0.0–149.0)
VLDL: 14.6 mg/dL (ref 0.0–40.0)

## 2020-06-09 LAB — BASIC METABOLIC PANEL
BUN: 14 mg/dL (ref 6–23)
CO2: 34 mEq/L — ABNORMAL HIGH (ref 19–32)
Calcium: 9.7 mg/dL (ref 8.4–10.5)
Chloride: 103 mEq/L (ref 96–112)
Creatinine, Ser: 0.93 mg/dL (ref 0.40–1.20)
GFR: 62.37 mL/min (ref >60.00)
Glucose, Bld: 113 mg/dL — ABNORMAL HIGH (ref 70–99)
Potassium: 4.2 mEq/L (ref 3.5–5.1)
Sodium: 141 mEq/L (ref 135–145)

## 2020-06-09 LAB — HEMOGLOBIN A1C: Hgb A1c MFr Bld: 6.2 % (ref 4.6–6.5)

## 2020-06-10 ENCOUNTER — Other Ambulatory Visit: Payer: Medicare Other

## 2020-06-12 ENCOUNTER — Ambulatory Visit (INDEPENDENT_AMBULATORY_CARE_PROVIDER_SITE_OTHER): Payer: Medicare Other | Admitting: Internal Medicine

## 2020-06-12 ENCOUNTER — Other Ambulatory Visit: Payer: Self-pay

## 2020-06-12 ENCOUNTER — Encounter: Payer: Self-pay | Admitting: Internal Medicine

## 2020-06-12 VITALS — BP 130/70 | HR 97 | Temp 98.1°F | Ht 64.0 in | Wt 229.4 lb

## 2020-06-12 DIAGNOSIS — K219 Gastro-esophageal reflux disease without esophagitis: Secondary | ICD-10-CM | POA: Diagnosis not present

## 2020-06-12 DIAGNOSIS — Z170 Estrogen receptor positive status [ER+]: Secondary | ICD-10-CM

## 2020-06-12 DIAGNOSIS — J4521 Mild intermittent asthma with (acute) exacerbation: Secondary | ICD-10-CM | POA: Diagnosis not present

## 2020-06-12 DIAGNOSIS — C50812 Malignant neoplasm of overlapping sites of left female breast: Secondary | ICD-10-CM | POA: Diagnosis not present

## 2020-06-12 DIAGNOSIS — R739 Hyperglycemia, unspecified: Secondary | ICD-10-CM

## 2020-06-12 DIAGNOSIS — Z9109 Other allergy status, other than to drugs and biological substances: Secondary | ICD-10-CM | POA: Diagnosis not present

## 2020-06-12 DIAGNOSIS — E785 Hyperlipidemia, unspecified: Secondary | ICD-10-CM

## 2020-06-12 MED ORDER — ROSUVASTATIN CALCIUM 40 MG PO TABS: 40.0000 mg | ORAL_TABLET | Freq: Every day | ORAL | 3 refills | Status: DC

## 2020-06-12 NOTE — Progress Notes (Signed)
Patient ID: Robin Hill, female   DOB: 15-Mar-1951, 70 y.o.   MRN: [id number]   Subjective:    Patient ID: Robin Hill, female    DOB: 04/24/1951, 70 y.o.   MRN: [id number]  HPI This visit occurred during the SARS-CoV-2 public health emergency.  Safety protocols were in place, including screening questions prior to the visit, additional usage of staff PPE, and extensive cleaning of exam room while observing appropriate contact time as indicated for disinfecting solutions.  Patient here for a scheduled follow up. Here to follow up regarding her cholesterol, asthma and blood sugar.    Past Medical History:  Diagnosis Date  . Allergic rhinitis   . Allergy   . Asthma   . Breast cancer (HCC) 2012   mastectomy with chemo and rad tx  . Diverticulosis   . Dysmenorrhea   . Hypercholesterolemia    Past Surgical History:  Procedure Laterality Date  . BREAST BIOPSY Right 2012   benign  . BREAST SURGERY  2011   breast cancer  . CESAREAN SECTION  1970  . CESAREAN SECTION  1973  . CESAREAN SECTION  1987  . COLONOSCOPY WITH PROPOFOL N/A 10/03/2015   Procedure: COLONOSCOPY WITH PROPOFOL;  Surgeon: Scot Jun, MD;  Location: [facility] ENDOSCOPY;  Service: Endoscopy;  Laterality: N/A;  . MASTECTOMY Left 2012   with chemo and rad tx  . NASAL SINUS SURGERY  07/2001   Family History  Problem Relation Age of Onset  . Cirrhosis Brother        liver  . Breast cancer Paternal Aunt        2 aunts   Social History   Socioeconomic History  . Marital status: Divorced    Spouse name: Not on file  . Number of children: Not on file  . Years of education: Not on file  . Highest education level: Not on file  Occupational History  . Not on file  Tobacco Use  . Smoking status: Former Smoker    Types: Cigarettes  . Smokeless tobacco: Never Used  Vaping Use  . Vaping Use: Never used  Substance and Sexual Activity  . Alcohol use: Yes    Alcohol/week: 1.0 standard drink    Types: 1 Glasses  of wine per week    Comment: OCC  . Drug use: No  . Sexual activity: Never  Other Topics Concern  . Not on file  Social History Narrative  . Not on file   Social Determinants of Health   Financial Resource Strain: Low Risk   . Difficulty of Paying Living Expenses: Not hard at all  Food Insecurity: No Food Insecurity  . Worried About Programme researcher, broadcasting/film/video in the Last Year: Never true  . Ran Out of Food in the Last Year: Never true  Transportation Needs: No Transportation Needs  . Lack of Transportation (Medical): No  . Lack of Transportation (Non-Medical): No  Physical Activity: Not on file  Stress: No Stress Concern Present  . Feeling of Stress : Not at all  Social Connections: Not on file    Outpatient Encounter Medications as of 06/12/2020  Medication Sig  . Albuterol Sulfate (PROAIR HFA IN) 90 mcg. 2 puffs using inhaler four times a day as needed.  Marland Kitchen anastrozole (ARIMIDEX) 1 MG tablet TAKE 1 TABLET BY MOUTH  DAILY  . azelastine (ASTELIN) 0.1 % nasal spray Place 2 sprays into both nostrils 2 (two) times daily. 137 mcg. Spray 2 sprays into both nostrils  twice a day  . b complex vitamins capsule Take 1 capsule by mouth daily.  . budesonide-formoterol (SYMBICORT) 160-4.5 MCG/ACT inhaler USE 2 INHALATIONS BY MOUTH  TWICE DAILY  . cholecalciferol (VITAMIN D) 400 UNITS TABS Take 400 Units by mouth daily. Chewable  . Cyanocobalamin (VITAMIN B12 PO) Take 500 mcg by mouth daily. Administer 1 tablet by mouth once a day  . fluticasone (FLONASE) 50 MCG/ACT nasal spray USE 1 SPRAY INTO EACH  NOSTRIL TWICE A DAY  . levalbuterol (XOPENEX HFA) 45 MCG/ACT inhaler Inhale 2 puffs four times a day as needed  . montelukast (SINGULAIR) 10 MG tablet Take 1 tablet (10 mg total) by mouth at bedtime. 1 tablet once a day  . omeprazole (PRILOSEC) 10 MG capsule TAKE 1 CAPSULE BY MOUTH  DAILY  . pregabalin (LYRICA) 100 MG capsule Take 1 capsule (100 mg total) by mouth 2 (two) times daily.  . vitamin E 400  UNIT capsule Take 400 Units by mouth daily.  . [DISCONTINUED] rosuvastatin (CRESTOR) 20 MG tablet TAKE 1 TABLET BY MOUTH  DAILY  . [DISCONTINUED] rosuvastatin (CRESTOR) 40 MG tablet Take 1 tablet (40 mg total) by mouth daily.  . rosuvastatin (CRESTOR) 40 MG tablet Take 1 tablet (40 mg total) by mouth daily.   No facility-administered encounter medications on file as of 06/12/2020.    Review of Systems     Objective:    Physical Exam  BP 130/70   Pulse 97   Temp 98.1 F (36.7 C) (Oral)   Ht $R'5\' 4"'RX$  (1.626 m)   Wt 229 lb 6.4 oz (104.1 kg)   SpO2 96%   BMI 39.38 kg/m  Wt Readings from Last 3 Encounters:  06/12/20 229 lb 6.4 oz (104.1 kg)  05/13/20 223 lb (101.2 kg)  04/08/20 223 lb (101.2 kg)     Lab Results  Component Value Date   WBC 6.4 04/02/2020   HGB 12.8 04/02/2020   HCT 39.2 04/02/2020   PLT 198 04/02/2020   GLUCOSE 113 (H) 06/09/2020   CHOL 202 (H) 06/09/2020   TRIG 73.0 06/09/2020   HDL 77.10 06/09/2020   LDLDIRECT 126.2 05/09/2013   LDLCALC 111 (H) 06/09/2020   ALT 16 06/09/2020   AST 18 06/09/2020   NA 141 06/09/2020   K 4.2 06/09/2020   CL 103 06/09/2020   CREATININE 0.93 06/09/2020   BUN 14 06/09/2020   CO2 34 (H) 06/09/2020   TSH 3.79 06/29/2019   HGBA1C 6.2 06/09/2020   MICROALBUR <0.7 09/09/2014    DG Bone Density  Result Date: 01/22/2020 EXAM: DUAL X-RAY ABSORPTIOMETRY (DXA) FOR BONE MINERAL DENSITY IMPRESSION: Your patient Robin Hill completed a BMD test on 01/22/2020 using the [employer] (software version: 14.10) manufactured by [employer]. The following summarizes the results of our evaluation. Technologist: [facility] PATIENT BIOGRAPHICAL: Name: Robin Hill Patient ID: [id number] Birth Date: 1950/08/31 Height: 64.0 in. Gender: Female Exam Date: 01/22/2020 Weight: 225.0 lbs. Indications: Breast CA, History of Breast Cancer, History of Chemo, History of Radiation, Postmenopausal Fractures: Treatments: Vitamin D, Calcium,  Anastrozole, Multi-Vitamin with calcium DENSITOMETRY RESULTS: Site      Region     Measured Date Measured Age WHO Classification Young Adult T-score BMD         %Change vs. Previous Significant Change (*) AP Spine L1-L4 (L3) 01/22/2020 69.9 Normal -1.0 1.060 g/cm2 -6.2% Yes AP Spine L1-L4 (L3) 12/20/2016 66.8 Normal -0.4 1.130 g/cm2 -6.1% Yes AP Spine L1-L4 (L3) 12/26/2012 62.8 Normal  0.2 1.203 g/cm2 - - DualFemur Neck Right 01/22/2020 69.9 Normal 0.1 1.053 g/cm2 -4.7% Yes DualFemur Neck Right 12/20/2016 66.8 Normal 0.5 1.105 g/cm2 4.5% - DualFemur Neck Right 12/26/2012 62.8 Normal 0.1 1.057 g/cm2 - - DualFemur Total Mean 01/22/2020 69.9 Normal 1.1 1.152 g/cm2 0.6% - DualFemur Total Mean 12/20/2016 66.8 Normal 1.1 1.145 g/cm2 0.5% - DualFemur Total Mean 12/26/2012 62.8 Normal 1.0 1.139 g/cm2 - - ASSESSMENT: The BMD measured at AP Spine L1-L4 (L3) is 1.060 g/cm2 with a T-score of -1.0. This patient is considered normal according to [facility]) criteria. The scan quality is good. L-3 was excluded due to degenerative changes. Compared with prior study, there has been significant decrease in the spine. Compared with prior study, there has been no significant change in the total hip. World Pharmacologist [facility]) criteria for post-menopausal, Caucasian Women: Normal:                   T-score at or above -1 SD Osteopenia/low bone mass: T-score between -1 and -2.5 SD Osteoporosis:             T-score at or below -2.5 SD RECOMMENDATIONS: 1. All patients should optimize calcium and vitamin D intake. 2. Consider FDA-approved medical therapies in postmenopausal women and men aged 54 years and older, based on the following: a. A hip or vertebral(clinical or morphometric) fracture b. T-score < -2.5 at the femoral neck or spine after appropriate evaluation to exclude secondary causes c. Low bone mass (T-score between -1.0 and -2.5 at the femoral neck or spine) and a 10-year probability of a hip fracture >  3% or a 10-year probability of a major osteoporosis-related fracture > 20% based on the [place]-adapted WHO algorithm 3. Clinician judgment and/or patient preferences may indicate treatment for people with 10-year fracture probabilities above or below these levels FOLLOW-UP: People with diagnosed cases of osteoporosis or at high risk for fracture should have regular bone mineral density tests. For patients eligible for Medicare, routine testing is allowed once every 2 years. The testing frequency can be increased to one year for patients who have rapidly progressing disease, those who are receiving or discontinuing medical therapy to restore bone mass, or have additional risk factors. I have reviewed this report, and agree with the above findings. [facility] Electronically Signed   By: Lowella Grip III M.D.   On: 01/22/2020 10:33       Assessment & Plan:   Problem List Items Addressed This Visit    Asthma    Continue singulair and symbicort.   Has xopenex if needed.  Breathing stable.       Relevant Orders   AMB Referral to [facility]   Carcinoma of overlapping sites of left breast in female, estrogen receptor positive ([place])    Followed by oncology.  Continue arimidex.        Relevant Orders   AMB Referral to Community Care Coordinaton   Environmental allergies    Continue singulair.  Stable.  Follow.        GERD (gastroesophageal reflux disease)    No upper symptoms reported.  Controlled on omeprazole.       HLD (hyperlipidemia) - Primary    Reviewed cholesterol labs and discussed with pt.  LDL 111.  Goal 70.  Increase crestor to $RemoveBe'40mg'CamUFJinC$  q day.  Follow lipid panel and liver function tests.  Discussed diet and exercise.        Relevant Medications   rosuvastatin (  CRESTOR) 40 MG tablet   Other Relevant Orders   Hepatic function panel   TSH   Lipid panel   Basic metabolic panel   Hyperglycemia    Low carb diet and exercise.  Follow met b and a1c.   Recent check 6.2.        Relevant Orders   Hemoglobin X5T   Basic metabolic panel       Einar Pheasant, MD

## 2020-06-14 ENCOUNTER — Encounter: Payer: Self-pay | Admitting: Internal Medicine

## 2020-06-14 NOTE — Assessment & Plan Note (Signed)
Continue singulair and symbicort.   Has xopenex if needed.  Breathing stable.

## 2020-06-14 NOTE — Assessment & Plan Note (Signed)
Reviewed cholesterol labs and discussed with pt.  LDL 111.  Goal 70.  Increase crestor to 40mg  q day.  Follow lipid panel and liver function tests.  Discussed diet and exercise.

## 2020-06-14 NOTE — Assessment & Plan Note (Signed)
Followed by oncology.  Continue arimidex.

## 2020-06-14 NOTE — Assessment & Plan Note (Signed)
No upper symptoms reported.  Controlled on omeprazole.

## 2020-06-14 NOTE — Assessment & Plan Note (Signed)
Low carb diet and exercise.  Follow met b and a1c.  Recent check 6.2.

## 2020-06-14 NOTE — Assessment & Plan Note (Signed)
Continue singulair.  Stable.  Follow.

## 2020-06-16 ENCOUNTER — Telehealth: Payer: Self-pay

## 2020-06-16 NOTE — Chronic Care Management (AMB) (Signed)
  Chronic Care Management   Outreach Note  06/16/2020 Name: [name] MRN: [id number] DOB: 1951/01/17  [name] is a 70 y.o. year old female who is a primary care patient of Einar Pheasant, MD. I reached out to [name] by phone today in response to a referral sent by Ms. Emonee A Reddington's PCP, Dr. Nicki Reaper     An unsuccessful telephone outreach was attempted today. The patient was referred to the case management team for assistance with care management and care coordination.   Follow Up Plan: A HIPAA compliant phone message was left for the patient providing contact information and requesting a return call.  The care management team will reach out to the patient again over the next 4 days.  If patient returns call to provider office, please advise to call [facility]  at [address], [place], [place], [place] [address] Direct Dial: [phone] [email or web address] Website: [email or web address]

## 2020-06-17 NOTE — Chronic Care Management (AMB) (Signed)
  Chronic Care Management   Note  06/17/2020 Name: LARUEN RISSER MRN: [id number] DOB: [birth year]  [name] is a 70 y.o. year old female who is a primary care patient of Einar Pheasant, MD. I reached out to [name] by phone today in response to a referral sent by Ms. Yemariam A Saxer's PCP, Dr.Scott     Ms. Gough was given information about Chronic Care Management services today including:  1. CCM service includes personalized support from designated clinical staff supervised by her physician, including individualized plan of care and coordination with other care providers 2. 24/7 contact phone numbers for assistance for urgent and routine care needs. 3. Service will only be billed when office clinical staff spend 20 minutes or more in a month to coordinate care. 4. Only one practitioner may furnish and bill the service in a calendar month. 5. The patient may stop CCM services at any time (effective at the end of the month) by phone call to the office staff. 6. The patient will be responsible for cost sharing (co-pay) of up to 20% of the service fee (after annual deductible is met).  Patient agreed to services and verbal consent obtained.   Follow up plan: Telephone appointment with care management team member scheduled for:07/04/2020  Noreene Larsson, [address], [place], [place] [address] Direct Dial: [phone] [email or web address]

## 2020-06-18 ENCOUNTER — Telehealth: Payer: Self-pay | Admitting: Internal Medicine

## 2020-06-18 NOTE — Telephone Encounter (Signed)
Pt called since starting the rosuvastatin (CRESTOR) 40 MG tablet she has been having a headache and wanted to discuss

## 2020-06-19 NOTE — Telephone Encounter (Signed)
Called patient this morning and patient is feeling better and would like to continue trying the medication and will let me know if she has another head ache.

## 2020-06-30 ENCOUNTER — Other Ambulatory Visit: Payer: Self-pay | Admitting: *Deleted

## 2020-06-30 DIAGNOSIS — Z170 Estrogen receptor positive status [ER+]: Secondary | ICD-10-CM

## 2020-07-04 ENCOUNTER — Ambulatory Visit (INDEPENDENT_AMBULATORY_CARE_PROVIDER_SITE_OTHER): Payer: Medicare Other | Admitting: Pharmacist

## 2020-07-04 DIAGNOSIS — J4521 Mild intermittent asthma with (acute) exacerbation: Secondary | ICD-10-CM

## 2020-07-04 DIAGNOSIS — K219 Gastro-esophageal reflux disease without esophagitis: Secondary | ICD-10-CM

## 2020-07-04 DIAGNOSIS — C50812 Malignant neoplasm of overlapping sites of left female breast: Secondary | ICD-10-CM

## 2020-07-04 DIAGNOSIS — Z170 Estrogen receptor positive status [ER+]: Secondary | ICD-10-CM | POA: Diagnosis not present

## 2020-07-04 DIAGNOSIS — E785 Hyperlipidemia, unspecified: Secondary | ICD-10-CM | POA: Diagnosis not present

## 2020-07-04 DIAGNOSIS — R739 Hyperglycemia, unspecified: Secondary | ICD-10-CM

## 2020-07-04 NOTE — Patient Instructions (Signed)
Visit Information  PATIENT GOALS:  Goals Addressed              This Visit's Progress     Patient Stated   .  Medication Monitoring (pt-stated)        Patient Goals/Self-Care Activities . Over the next 90 days, patient Robin Hill:  - take medications as prescribed collaborate with provider on medication access solutions Fill medications for 90 day supplies        Consent to CCM Services: Robin Hill was given information about Chronic Care Management services today including:  1. CCM service includes personalized support from designated clinical staff supervised by her physician, including individualized plan of care and coordination with other care providers 2. 24/7 contact phone numbers for assistance for urgent and routine care needs. 3. Service Robin Hill only be billed when office clinical staff spend 20 minutes or more in a month to coordinate care. 4. Only one practitioner may furnish and bill the service in a calendar month. 5. The patient may stop CCM services at any time (effective at the end of the month) by phone call to the office staff. 6. The patient Robin Hill be responsible for cost sharing (co-pay) of up to 20% of the service fee (after annual deductible is met).  Patient agreed to services and verbal consent obtained.   The patient verbalized understanding of instructions, educational materials, and care plan provided today and declined offer to receive copy of patient instructions, educational materials, and care plan.   Patient Goals/Self-Care Activities . Over the next 90 days, patient Robin Hill:  - take medications as prescribed collaborate with provider on medication access solutions Fill medications for 90 day supplies   CLINICAL CARE PLAN: Patient Care Plan: Medication Management    Problem Identified: Asthma, hx breast cancer, peripheral neuropathy, HLD     Long-Range Goal: Disease Progression Prevention   Start Date: 07/04/2020  This Visit's Progress: On track   Priority: High  Note:   Current Barriers:  . Unable to independently afford treatment regimen  Pharmacist Clinical Goal(s):  Robin Hill Over the next 90 days, patient Robin Hill ability to afford treatment regimen through collaboration with PharmD and provider.   Interventions: . 1:1 collaboration with Robin Pheasant, MD regarding development and update of comprehensive plan of care as evidenced by provider attestation and co-signature . Inter-disciplinary care team collaboration (see longitudinal plan of care) . Comprehensive medication review performed; medication list updated in electronic medical record  SDOH: . Through med review, realized that patient has Medicare Extra Help based upon the fact that she does not have a monthly premium and copays are $3.95 for generics and $9.85 for brand name medications for 90 day supply.  . Notes that she is having a difficult time with the cost of dental work. Care Guide referral palced   Hyperlipidemia: . Uncontrolled, though anticipated to be improved; current treatment: rosuvastatin 40 mg daily (just increased) . Confirmed resolution of headache that she had reported when she first increased rosuvastatin dose . Continue current regimen as above. F/u lipids with next PCP appt  Asthma/Allergies: . Controlled; current treatment: Symbicort 160/4.5 mcg 2 puffs BID, occasional (very infrequent) use of albuterol HFA; fluticasone nasal spray twice daily. . Reviewed copay for Symbicort. Cost is appropriate at $9.85/90 days supply.  . Continue current regimen at this time  Hx breast cancer, peripheral neuropathy: . Appropriately managed; anastrazole 1 mg daily; reports she may be able to discontinue this medication at her next f/u as she has  been on for 10 years.  . Peripheral neuropathy: pregabalin 100 mg BID; had historically been receiving through a patient assistance program since when Lyrica was brand name. Asks today about re-enrolling. We reviewed  that since she has Medicare Extra Help, she would not be eligible for any assistance programs. She had not tried to fill pregabalin on her insurance since it went generic, so she assumed it was much more expensive than it is. She reviewed her insurance app, the cost for pregabalin Robin Hill be $3.95/90 day supply . Robin Hill contact Dr. B to ask to send refill on pregabalin to [facility].  . Continue current regimen as above.   GERD: . Controlled per patient report; omeprazole 10 mg daily . Continue current regimen at this time  Supplement: Robin Hill Vitamin D, calcium citrate, Vitamin B12 every 3 days, Vitamin E . Reviewed that she reduced her vitamin B12 intake since her last Vitamin B level . Reviewed to continue to take citrate formulation of calcium due to concurrent PPI therapy. Patient verbalized understanding.    Patient Goals/Self-Care Activities . Over the next 90 days, patient Robin Hill:  - take medications as prescribed collaborate with provider on medication access solutions Fill medications for 90 day supplies   Follow Up Plan: Telephone follow up appointment with care management team member scheduled for: ~ 6 weeks

## 2020-07-04 NOTE — Chronic Care Management (AMB) (Signed)
Chronic Care Management Pharmacy Note  07/04/2020 Name:  Robin Hill MRN:  [id number] DOB:  October 16, 1950  Subjective: Robin Hill is an 70 y.o. year old female who is a primary patient of Einar Pheasant, MD.  The CCM team was consulted for assistance with disease management and care coordination needs.    Engaged with patient by telephone for initial visit in response to provider referral for pharmacy case management and/or care coordination services.   Consent to Services:  The patient was given the following information about Chronic Care Management services today, agreed to services, and gave verbal consent: 1. CCM service includes personalized support from designated clinical staff supervised by the primary care provider, including individualized plan of care and coordination with other care providers 2. 24/7 contact phone numbers for assistance for urgent and routine care needs. 3. Service will only be billed when office clinical staff spend 20 minutes or more in a month to coordinate care. 4. Only one practitioner may furnish and bill the service in a calendar month. 5.The patient may stop CCM services at any time (effective at the end of the month) by phone call to the office staff. 6. The patient will be responsible for cost sharing (co-pay) of up to 20% of the service fee (after annual deductible is met). Patient agreed to services and consent obtained.  Patient Care Team: Einar Pheasant, MD as PCP - General (Internal Medicine) Cammie Sickle, MD as Consulting Physician (Hematology and Oncology) De Hollingshead, RPH-CPP (Pharmacist)  Recent office visits:  2/10 - PCP visit - rosuvastatin increased d/t LDL not at goal  Recent consult visits: None recently  Hospital visits: None in previous 6 months  Objective:  Lab Results  Component Value Date   CREATININE 0.93 06/09/2020   BUN 14 06/09/2020   GFR 62.37 06/09/2020   GFRNONAA >60 12/31/2019   GFRAA >60  12/31/2019   NA 141 06/09/2020   K 4.2 06/09/2020   CALCIUM 9.7 06/09/2020   CO2 34 (H) 06/09/2020    Lab Results  Component Value Date/Time   HGBA1C 6.2 06/09/2020 08:41 AM   HGBA1C 6.0 06/29/2019 08:58 AM   GFR 62.37 06/09/2020 08:41 AM   GFR 82.39 06/29/2019 08:58 AM   MICROALBUR <0.7 09/09/2014 09:23 AM   MICROALBUR 0.2 05/09/2013 09:15 AM    Last diabetic Eye exam: No results found for: HMDIABEYEEXA  Last diabetic Foot exam: No results found for: HMDIABFOOTEX   Lab Results  Component Value Date   CHOL 202 (H) 06/09/2020   HDL 77.10 06/09/2020   LDLCALC 111 (H) 06/09/2020   LDLDIRECT 126.2 05/09/2013   TRIG 73.0 06/09/2020   CHOLHDL 3 06/09/2020    Hepatic Function Latest Ref Rng & Units 06/09/2020 12/31/2019 06/29/2019  Total Protein 6.0 - 8.3 g/dL 6.9 7.0 7.2  Albumin 3.5 - 5.2 g/dL 4.3 4.0 4.0  AST 0 - 37 U/L _0 ALT 0 - 35 U/L _1 Alk Phosphatase 39 - 117 U/L 63 61 62  Total Bilirubin 0.2 - 1.2 mg/dL 0.5 0.5 0.6  Bilirubin, Direct 0.0 - 0.3 mg/dL 0.1 - 0.1    Lab Results  Component Value Date/Time   TSH 3.79 06/29/2019 08:58 AM   TSH 2.40 09/01/2017 08:24 AM    CBC Latest Ref Rng & Units 04/02/2020 12/31/2019 06/29/2019  WBC 4.0 - 10.5 K/uL 6.4 6.8 4.9  Hemoglobin 12.0 - 15.0 g/dL 12.8 11.8(L) 12.7  Hematocrit 36.0 - 46.0 %  39.2 34.4(L) 37.8  Platelets 150 - 400 K/uL 198 166 177.0    No results found for: VD25OH  Clinical ASCVD: No  The 10-year ASCVD risk score Mikey Bussing DC Jr., et al., 2013) is: 12.5%   Values used to calculate the score:     Age: 76 years     Sex: Female     Is Non-Hispanic African American: Yes     Diabetic: No     Tobacco smoker: No     Systolic Blood Pressure: 962 mmHg     Is BP treated: No     HDL Cholesterol: 77.1 mg/dL     Total Cholesterol: 202 mg/dL    Depression screen [facility] 2/9 05/13/2020 11/08/2019 06/08/2019  Decreased Interest 0 0 0  Down, Depressed, Hopeless 0 0 0  PHQ - 2 Score 0 0 0      Social History    Tobacco Use  Smoking Status Former Smoker  . Types: Cigarettes  Smokeless Tobacco Never Used   BP Readings from Last 3 Encounters:  06/12/20 130/70  12/31/19 124/68  11/08/19 126/72   Pulse Readings from Last 3 Encounters:  06/12/20 97  12/31/19 71  11/08/19 78   Wt Readings from Last 3 Encounters:  06/12/20 229 lb 6.4 oz (104.1 kg)  05/13/20 223 lb (101.2 kg)  04/08/20 223 lb (101.2 kg)    Assessment/Interventions: Review of patient past medical history, allergies, medications, health status, including review of consultants reports, laboratory and other test data, was performed as part of comprehensive evaluation and provision of chronic care management services.   SDOH:  (Social Determinants of Health) assessments and interventions performed: Yes SDOH Interventions   Flowsheet Row Most Recent Value  SDOH Interventions   Financial Strain Interventions Other (Comment)  [Care Guide referral placed]      CCM Care Plan  No Known Allergies  Medications Reviewed Today    Reviewed by De Hollingshead, RPH-CPP (Pharmacist) on 07/04/20 at [place] List Status: <None>  Medication Order Taking? Sig Documenting Provider Last Dose Status Informant  Albuterol Sulfate (PROAIR HFA IN) [id number] No 90 mcg. 2 puffs using inhaler four times a day as needed.  Patient not taking: Reported on 07/04/2020   [provider] Not Taking Active   anastrozole (ARIMIDEX) 1 MG tablet [id number] Yes TAKE 1 TABLET BY MOUTH  DAILY Cammie Sickle, MD Taking Active   b complex vitamins capsule [id number] Yes Take 1 capsule by mouth daily. [provider] Taking Active   budesonide-formoterol (SYMBICORT) 160-4.5 MCG/ACT inhaler [id number] Yes USE 2 INHALATIONS BY MOUTH  TWICE DAILY Einar Pheasant, MD Taking Active   cholecalciferol (VITAMIN D) 400 UNITS TABS [id number] Yes Take 400 Units by mouth daily. Chewable [provider] Taking Active   Cyanocobalamin (VITAMIN B12  PO) [id number] Yes Take 500 mcg by mouth daily. Administer 1 tablet by mouth once a day [provider] Taking Active   fluticasone (FLONASE) 50 MCG/ACT nasal spray [id number] Yes USE 1 SPRAY INTO EACH  NOSTRIL TWICE A Lemont Fillers, MD Taking Active   omeprazole (PRILOSEC) 10 MG capsule [id number] Yes TAKE 1 CAPSULE BY MOUTH  DAILY Einar Pheasant, MD Taking Active   pregabalin (LYRICA) 100 MG capsule [id number] Yes Take 1 capsule (100 mg total) by mouth 2 (two) times daily. Cammie Sickle, MD Taking Active   rosuvastatin (CRESTOR) 40 MG tablet [id number] Yes Take 1 tablet (40 mg total) by mouth daily. Einar Pheasant, MD Taking Active  vitamin E 400 UNIT capsule [id number] Yes Take 400 Units by mouth daily. [provider] Taking Active           Patient Active Problem List   Diagnosis Date Noted  . Pendulous breast 04/13/2020  . Vaginal discharge 11/13/2019  . Neuropathy of right thigh 10/26/2019  . Nodule of groin 10/01/2019  . Leg erythema 10/08/2018  . Chest tightness 02/26/2018  . Shoulder pain, right 07/07/2017  . Carcinoma of overlapping sites of left breast in female, estrogen receptor positive ([place]) 12/26/2015  . History of colonic polyps 10/08/2015  . Head pain 03/02/2015  . Health care maintenance 09/08/2014  . Hyperglycemia 09/08/2014  . Acute bronchitis 07/31/2014  . Asthma 07/31/2014  . BMI 34.0-34.9,adult 10/27/2013  . Nail fungus 05/16/2013  . Laryngitis 04/02/2013  . HLD (hyperlipidemia) 10/02/2012  . GERD (gastroesophageal reflux disease) 10/02/2012  . Environmental allergies 10/02/2012  . Diverticulosis 10/02/2012    Immunization History  Administered Date(s) Administered  . Influenza Split 02/22/2014  . Influenza Whole 02/17/2017  . Influenza, High Dose Seasonal PF 01/10/2018, 12/29/2018  . Influenza-Unspecified 02/24/2015, 12/09/2015, 01/02/2020  . PFIZER(Purple Top)SARS-COV-2 Vaccination 06/26/2019, 07/17/2019, 02/21/2020  .  Pneumococcal Conjugate-13 06/05/2016  . Pneumococcal Polysaccharide-23 02/24/2015  . Tdap 12/09/2015  . Zoster Recombinat (Shingrix) 08/13/2016, 12/29/2018    Conditions to be addressed/monitored:  Diabetes, Asthma and hx breast cancer  Care Plan : Medication Management  Updates made by De Hollingshead, RPH-CPP since 07/04/2020 12:00 AM    Problem: Asthma, hx breast cancer, peripheral neuropathy, HLD     Long-Range Goal: Disease Progression Prevention   Start Date: 07/04/2020  This Visit's Progress: On track  Priority: High  Note:   Current Barriers:  . Unable to independently afford treatment regimen  Pharmacist Clinical Goal(s):  Marland Kitchen Over the next 90 days, patient will verbalize ability to afford treatment regimen through collaboration with PharmD and provider.   Interventions: . 1:1 collaboration with Einar Pheasant, MD regarding development and update of comprehensive plan of care as evidenced by provider attestation and co-signature . Inter-disciplinary care team collaboration (see longitudinal plan of care) . Comprehensive medication review performed; medication list updated in electronic medical record  SDOH: . Through med review, realized that patient has Medicare Extra Help based upon the fact that she does not have a monthly premium and copays are $3.95 for generics and $9.85 for brand name medications for 90 day supply.  . Notes that she is having a difficult time with the cost of dental work. Care Guide referral palced   Hyperlipidemia: . Uncontrolled, though anticipated to be improved; current treatment: rosuvastatin 40 mg daily (just increased) . Confirmed resolution of headache that she had reported when she first increased rosuvastatin dose . Continue current regimen as above. F/u lipids with next PCP appt  Asthma/Allergies: . Controlled; current treatment: Symbicort 160/4.5 mcg 2 puffs BID, occasional (very infrequent) use of albuterol HFA; fluticasone nasal  spray twice daily. . Reviewed copay for Symbicort. Cost is appropriate at $9.85/90 days supply.  . Continue current regimen at this time  Hx breast cancer, peripheral neuropathy: . Appropriately managed; anastrazole 1 mg daily; reports she may be able to discontinue this medication at her next f/u as she has been on for 10 years.  . Peripheral neuropathy: pregabalin 100 mg BID; had historically been receiving through a patient assistance program since when Lyrica was brand name. Asks today about re-enrolling. We reviewed that since she has Medicare Extra Help, she would not be  eligible for any assistance programs. She had not tried to fill pregabalin on her insurance since it went generic, so she assumed it was much more expensive than it is. She reviewed her insurance app, the cost for pregabalin will be $3.95/90 day supply . Will contact Dr. B to ask to send refill on pregabalin to [facility].  . Continue current regimen as above.   GERD: . Controlled per patient report; omeprazole 10 mg daily . Continue current regimen at this time  Supplement: Marland Kitchen Vitamin D, calcium citrate, Vitamin B12 every 3 days, Vitamin E . Reviewed that she reduced her vitamin B12 intake since her last Vitamin B level . Reviewed to continue to take citrate formulation of calcium due to concurrent PPI therapy. Patient verbalized understanding.    Patient Goals/Self-Care Activities . Over the next 90 days, patient will:  - take medications as prescribed collaborate with provider on medication access solutions Fill medications for 90 day supplies   Follow Up Plan: Telephone follow up appointment with care management team member scheduled for: ~ 6 weeks       Medication Assistance: None required.  Patient affirms current coverage meets needs.  Patient's preferred pharmacy is:  [facility] [address]), [place] - [place] ([place]) [place] [address] Phone:  [phone] Fax: [phone]  [facility], [address], [address] [place], [place] [address] Phone: [phone] Fax: [place], [address] [place] [address] Phone: [phone] Fax: [phone]  Care Plan and Follow Up Patient Decision:  Patient agrees to Care Plan and Follow-up.  Plan: Telephone follow up appointment with care management team member scheduled for:  ~ 6 weeks  Catie Darnelle Maffucci, PharmD, [place], [place] Clinical Pharmacist [employer] at [employer] [phone]

## 2020-07-08 ENCOUNTER — Ambulatory Visit: Payer: Medicare Other | Admitting: Internal Medicine

## 2020-07-09 ENCOUNTER — Telehealth: Payer: Self-pay | Admitting: *Deleted

## 2020-07-09 NOTE — Telephone Encounter (Signed)
   Telephone encounter was:  Successful.  07/09/2020 Name: Robin Hill MRN: [id number] DOB: [birth year]  [name] is a 70 y.o. year old female who is a primary care patient of Einar Pheasant, MD . The community resource team was consulted for assistance with Help paying for crowns   Care guide performed the following interventions: Patient provided with information about care guide support team and interviewed to confirm resource needs.  Follow Up Plan:  No further follow up planned at this time. The patient has been provided with needed resources. [place], Care Management  [phone] [address] , [place] [address] Email : [email or web address]

## 2020-07-16 DIAGNOSIS — H2513 Age-related nuclear cataract, bilateral: Secondary | ICD-10-CM | POA: Diagnosis not present

## 2020-07-22 ENCOUNTER — Other Ambulatory Visit: Payer: Self-pay | Admitting: Internal Medicine

## 2020-08-21 ENCOUNTER — Telehealth: Payer: Self-pay | Admitting: Pharmacist

## 2020-08-21 ENCOUNTER — Ambulatory Visit: Payer: Medicare Other | Admitting: Pharmacist

## 2020-08-21 DIAGNOSIS — G5791 Unspecified mononeuropathy of right lower limb: Secondary | ICD-10-CM

## 2020-08-21 DIAGNOSIS — E785 Hyperlipidemia, unspecified: Secondary | ICD-10-CM

## 2020-08-21 DIAGNOSIS — J4521 Mild intermittent asthma with (acute) exacerbation: Secondary | ICD-10-CM

## 2020-08-21 DIAGNOSIS — C50812 Malignant neoplasm of overlapping sites of left female breast: Secondary | ICD-10-CM

## 2020-08-21 NOTE — Telephone Encounter (Signed)
Patient returned call. Disregard need for rescheduling.

## 2020-08-21 NOTE — Chronic Care Management (AMB) (Signed)
Chronic Care Management Pharmacy Note  08/21/2020 Name:  Robin Hill MRN:  [id number] DOB:  1950-12-22  Subjective: Robin Hill is an 70 y.o. year old female who is a primary patient of Einar Pheasant, MD.  The CCM team was consulted for assistance with disease management and care coordination needs.    Engaged with patient by telephone for follow up visit in response to provider referral for pharmacy case management and/or care coordination services.   Consent to Services:  The patient was given information about Chronic Care Management services, agreed to services, and gave verbal consent prior to initiation of services.  Please see initial visit note for detailed documentation.   Patient Care Team: Einar Pheasant, MD as PCP - General (Internal Medicine) Cammie Sickle, MD as Consulting Physician (Hematology and Oncology) De Hollingshead, RPH-CPP (Pharmacist)  Recent office visits: None since our last visit  Recent consult visits: None since our last visit  Hospital visits: None in previous 6 months  Objective:  Lab Results  Component Value Date   CREATININE 0.93 06/09/2020   CREATININE 0.80 12/31/2019   CREATININE 0.83 06/29/2019    Lab Results  Component Value Date   HGBA1C 6.2 06/09/2020   Last diabetic Eye exam: No results found for: HMDIABEYEEXA  Last diabetic Foot exam: No results found for: HMDIABFOOTEX      Component Value Date/Time   CHOL 202 (H) 06/09/2020 0841   TRIG 73.0 06/09/2020 0841   HDL 77.10 06/09/2020 0841   CHOLHDL 3 06/09/2020 0841   VLDL 14.6 06/09/2020 0841   LDLCALC 111 (H) 06/09/2020 0841   LDLDIRECT 126.2 05/09/2013 0915    Hepatic Function Latest Ref Rng & Units 06/09/2020 12/31/2019 06/29/2019  Total Protein 6.0 - 8.3 g/dL 6.9 7.0 7.2  Albumin 3.5 - 5.2 g/dL 4.3 4.0 4.0  AST 0 - 37 U/L _0 ALT 0 - 35 U/L _1 Alk Phosphatase 39 - 117 U/L 63 61 62  Total Bilirubin 0.2 - 1.2 mg/dL 0.5 0.5 0.6   Bilirubin, Direct 0.0 - 0.3 mg/dL 0.1 - 0.1    Lab Results  Component Value Date/Time   TSH 3.79 06/29/2019 08:58 AM   TSH 2.40 09/01/2017 08:24 AM    CBC Latest Ref Rng & Units 04/02/2020 12/31/2019 06/29/2019  WBC 4.0 - 10.5 K/uL 6.4 6.8 4.9  Hemoglobin 12.0 - 15.0 g/dL 12.8 11.8(L) 12.7  Hematocrit 36.0 - 46.0 % 39.2 34.4(L) 37.8  Platelets 150 - 400 K/uL 198 166 177.0   Clinical ASCVD: No  The 10-year ASCVD risk score Mikey Bussing DC Jr., et al., 2013) is: 12.5%   Values used to calculate the score:     Age: 77 years     Sex: Female     Is Non-Hispanic African American: Yes     Diabetic: No     Tobacco smoker: No     Systolic Blood Pressure: 277 mmHg     Is BP treated: No     HDL Cholesterol: 77.1 mg/dL     Total Cholesterol: 202 mg/dL     Social History   Tobacco Use  Smoking Status Former Smoker  . Types: Cigarettes  Smokeless Tobacco Never Used   BP Readings from Last 3 Encounters:  06/12/20 130/70  12/31/19 124/68  11/08/19 126/72   Pulse Readings from Last 3 Encounters:  06/12/20 97  12/31/19 71  11/08/19 78   Wt Readings from Last 3 Encounters:  06/12/20 229  lb 6.4 oz (104.1 kg)  05/13/20 223 lb (101.2 kg)  04/08/20 223 lb (101.2 kg)    Assessment: Review of patient past medical history, allergies, medications, health status, including review of consultants reports, laboratory and other test data, was performed as part of comprehensive evaluation and provision of chronic care management services.   SDOH:  (Social Determinants of Health) assessments and interventions performed:  SDOH Interventions   Flowsheet Row Most Recent Value  SDOH Interventions   Financial Strain Interventions Intervention Not Indicated      CCM Care Plan  No Known Allergies  Medications Reviewed Today    Reviewed by Travis, Catherine E, RPH-CPP (Pharmacist) on 08/21/20 at 1617  Med List Status: <None>  Medication Order Taking? Sig Documenting Provider Last Dose Status  Informant  Albuterol Sulfate (PROAIR HFA IN) [id number]  90 mcg. 2 puffs using inhaler four times a day as needed.  Patient not taking: Reported on 07/04/2020   [provider]  Active   anastrozole (ARIMIDEX) 1 MG tablet [id number] Yes TAKE 1 TABLET BY MOUTH  DAILY Brahmanday, Govinda R, MD Taking Active   b complex vitamins capsule [id number] Yes Take 1 capsule by mouth daily. [provider] Taking Active   budesonide-formoterol (SYMBICORT) 160-4.5 MCG/ACT inhaler [id number] Yes USE 2 INHALATIONS BY MOUTH  TWICE DAILY Scott, Charlene, MD Taking Active   CALCIUM CITRATE PO [id number] Yes Take by mouth. [provider] Taking Active   cholecalciferol (VITAMIN D) 400 UNITS TABS [id number] Yes Take 400 Units by mouth daily. Chewable [provider] Taking Active   Cyanocobalamin (VITAMIN B12 PO) [id number] Yes Take 500 mcg by mouth daily. Administer 1 tablet by mouth once a day [provider] Taking Active   fluticasone (FLONASE) 50 MCG/ACT nasal spray [id number] Yes USE 1 SPRAY INTO EACH  NOSTRIL TWICE A DAY Scott, Charlene, MD Taking Active   omeprazole (PRILOSEC) 10 MG capsule [id number] Yes TAKE 1 CAPSULE BY MOUTH  DAILY Scott, Charlene, MD Taking Active   pregabalin (LYRICA) 100 MG capsule [id number] Yes Take 1 capsule (100 mg total) by mouth 2 (two) times daily. Brahmanday, Govinda R, MD Taking Active   rosuvastatin (CRESTOR) 40 MG tablet [id number] Yes Take 1 tablet (40 mg total) by mouth daily. Scott, Charlene, MD Taking Active   vitamin E 400 UNIT capsule [id number] Yes Take 400 Units by mouth daily. [provider] Taking Active           Patient Active Problem List   Diagnosis Date Noted  . Pendulous breast 04/13/2020  . Vaginal discharge 11/13/2019  . Neuropathy of right thigh 10/26/2019  . Nodule of groin 10/01/2019  . Leg erythema 10/08/2018  . Chest tightness 02/26/2018  . Shoulder pain, right 07/07/2017  . Carcinoma of overlapping sites  of left breast in female, estrogen receptor positive (HCC) 12/26/2015  . History of colonic polyps 10/08/2015  . Head pain 03/02/2015  . Health care maintenance 09/08/2014  . Hyperglycemia 09/08/2014  . Acute bronchitis 07/31/2014  . Asthma 07/31/2014  . BMI 34.0-34.9,adult 10/27/2013  . Nail fungus 05/16/2013  . Laryngitis 04/02/2013  . HLD (hyperlipidemia) 10/02/2012  . GERD (gastroesophageal reflux disease) 10/02/2012  . Environmental allergies 10/02/2012  . Diverticulosis 10/02/2012    Immunization History  Administered Date(s) Administered  . Influenza Split 02/22/2014  . Influenza Whole 02/17/2017  . Influenza, High Dose Seasonal PF 01/10/2018, 12/29/2018  . Influenza-Unspecified 02/24/2015, 12/09/2015, 01/02/2020  . PFIZER(Purple Top)SARS-COV-2 Vaccination 06/26/2019, 07/17/2019, 02/21/2020  . Pneumococcal Conjugate-13 06/05/2016  .   Pneumococcal Polysaccharide-23 02/24/2015  . Tdap 12/09/2015  . Zoster Recombinat (Shingrix) 08/13/2016, 12/29/2018    Conditions to be addressed/monitored: Pulmonary Disease and hx breast cancer, HLD  Care Plan : Medication Management  Updates made by De Hollingshead, RPH-CPP since 08/21/2020 12:00 AM  Completed 08/21/2020  Problem: Asthma, hx breast cancer, peripheral neuropathy, HLD Resolved 08/21/2020    Long-Range Goal: Disease Progression Prevention Completed 08/21/2020  Start Date: 07/04/2020  This Visit's Progress: On track  Recent Progress: On track  Priority: High  Note:   Current Barriers:  . Unable to independently afford treatment regimen  Pharmacist Clinical Goal(s):  Marland Kitchen Over the next 90 days, patient will verbalize ability to afford treatment regimen through collaboration with PharmD and provider.   Interventions: . 1:1 collaboration with Einar Pheasant, MD regarding development and update of comprehensive plan of care as evidenced by provider attestation and co-signature . Inter-disciplinary care team collaboration  (see longitudinal plan of care) . Comprehensive medication review performed; medication list updated in electronic medical record  SDOH: . Patient has Medicare Extra Help based upon the fact that she does not have a monthly premium and copays are $3.95 for generics and $9.85 for brand name medications for 90 day supply.  . Care Guide provided support with dental resources  Hyperlipidemia: . Uncontrolled, though anticipated to be improved; current treatment: rosuvastatin 40 mg daily (just increased) . Confirmed resolution of headache that she had reported when she first increased rosuvastatin dose . Continue current regimen as above. F/u lipids with next PCP appt  Asthma/Allergies: . Controlled; current treatment: Symbicort 160/4.5 mcg 2 puffs BID, occasional (very infrequent) use of albuterol HFA; fluticasone nasal spray twice daily. . No affordability or therapy concerns. Recommend to continue current regimen at this time  Hx breast cancer, peripheral neuropathy: . Appropriately managed; anastrazole 1 mg daily; reports she may be able to discontinue this medication at her next f/u as she has been on for 10 years.  . Peripheral neuropathy: pregabalin 100 mg BID; previously receiving from patient assistance program, but the program ended. At last visit, we reviewed that copay would be affordable now with patient's Medicare Extra Help.  Marland Kitchen She will collaborate with Dr. Rogue Bussing to have refills sent to appropriate pharmacy.   GERD: . Controlled per patient report; omeprazole 10 mg daily . Recommend to continue current regimen at this time  Supplement: Marland Kitchen Vitamin D, calcium citrate, Vitamin B12 every 3 days, Vitamin E   Patient Goals/Self-Care Activities . Over the next 90 days, patient will:  - take medications as prescribed collaborate with provider on medication access solutions Fill medications for 90 day supplies   Follow Up Plan: Goals of therapy met. Closing CCM case.        Medication Assistance: None required.  Patient affirms current coverage meets needs.  Patient's preferred pharmacy is:  [facility] [address] [place]) [place] [address] Phone: [phone] Fax: [phone]  [facility], [address], [address] [place], [place] [address] Phone: [phone] Fax: [place], [address] [place] [address] Phone: [phone] Fax: [phone]   Follow Up:  Patient requests no follow-up at this time.  Plan: Goals of care met. Closing CCM case at this time.  Catie Darnelle Maffucci, PharmD, BCACP, CPP Clinical Pharmacist  [facility] at [place] [phone]    

## 2020-08-21 NOTE — Patient Instructions (Signed)
Visit Information  PATIENT GOALS: Goals Addressed              This Visit's Progress     Patient Stated   .  COMPLETED: Medication Monitoring (pt-stated)        Patient Goals/Self-Care Activities . Over the next 90 days, patient will:  - take medications as prescribed collaborate with provider on medication access solutions Fill medications for 90 day supplies        The patient verbalized understanding of instructions, educational materials, and care plan provided today and declined offer to receive copy of patient instructions, educational materials, and care plan.  Plan: Goals of care met. Closing CCM case at this time.  Catie Darnelle Maffucci, PharmD, [place], [place] Clinical Pharmacist [employer] at [place]

## 2020-08-21 NOTE — Telephone Encounter (Signed)
  Chronic Care Management   Note  08/21/2020 Name: Robin Hill MRN: [id number] DOB: 29-Oct-1950   Attempted to contact patient for scheduled appointment for medication management support. Left HIPAA compliant message for patient to return my call at their convenience.    Plan: - If I do not hear back from the patient by end of business today, will collaborate with Care Guide to outreach to schedule follow up with me   Catie Darnelle Maffucci, PharmD, [place], [place] Pharmacist [employer] at [employer] [phone]

## 2020-09-08 ENCOUNTER — Other Ambulatory Visit: Payer: Self-pay

## 2020-09-08 ENCOUNTER — Other Ambulatory Visit (INDEPENDENT_AMBULATORY_CARE_PROVIDER_SITE_OTHER): Payer: Medicare Other

## 2020-09-08 DIAGNOSIS — R739 Hyperglycemia, unspecified: Secondary | ICD-10-CM | POA: Diagnosis not present

## 2020-09-08 DIAGNOSIS — E785 Hyperlipidemia, unspecified: Secondary | ICD-10-CM

## 2020-09-08 LAB — BASIC METABOLIC PANEL
BUN: 14 mg/dL (ref 6–23)
CO2: 31 mEq/L (ref 19–32)
Calcium: 9.9 mg/dL (ref 8.4–10.5)
Chloride: 105 mEq/L (ref 96–112)
Creatinine, Ser: 0.89 mg/dL (ref 0.40–1.20)
GFR: 65.64 mL/min (ref >60.00)
Glucose, Bld: 115 mg/dL — ABNORMAL HIGH (ref 70–99)
Potassium: 4 mEq/L (ref 3.5–5.1)
Sodium: 142 mEq/L (ref 135–145)

## 2020-09-08 LAB — HEPATIC FUNCTION PANEL
ALT: 19 U/L (ref 0–35)
AST: 23 U/L (ref 0–37)
Albumin: 4.3 g/dL (ref 3.5–5.2)
Alkaline Phosphatase: 64 U/L (ref 39–117)
Bilirubin, Direct: 0.1 mg/dL (ref 0.0–0.3)
Total Bilirubin: 0.6 mg/dL (ref 0.2–1.2)
Total Protein: 6.9 g/dL (ref 6.0–8.3)

## 2020-09-08 LAB — HEMOGLOBIN A1C: Hgb A1c MFr Bld: 6.3 % (ref 4.6–6.5)

## 2020-09-08 LAB — LIPID PANEL
Cholesterol: 157 mg/dL (ref 0–200)
HDL: 66.6 mg/dL (ref >39.00)
LDL Cholesterol: 77 mg/dL (ref 0–99)
NonHDL: 90.27
Total CHOL/HDL Ratio: 2
Triglycerides: 65 mg/dL (ref 0.0–149.0)
VLDL: 13 mg/dL (ref 0.0–40.0)

## 2020-09-08 LAB — TSH: TSH: 3.35 u[IU]/mL (ref 0.35–4.50)

## 2020-09-11 ENCOUNTER — Ambulatory Visit (INDEPENDENT_AMBULATORY_CARE_PROVIDER_SITE_OTHER): Payer: Medicare Other | Admitting: Internal Medicine

## 2020-09-11 ENCOUNTER — Encounter: Payer: Self-pay | Admitting: Internal Medicine

## 2020-09-11 ENCOUNTER — Other Ambulatory Visit: Payer: Self-pay

## 2020-09-11 DIAGNOSIS — E785 Hyperlipidemia, unspecified: Secondary | ICD-10-CM | POA: Diagnosis not present

## 2020-09-11 DIAGNOSIS — Z6838 Body mass index (BMI) 38.0-38.9, adult: Secondary | ICD-10-CM

## 2020-09-11 DIAGNOSIS — R739 Hyperglycemia, unspecified: Secondary | ICD-10-CM

## 2020-09-11 DIAGNOSIS — B351 Tinea unguium: Secondary | ICD-10-CM | POA: Diagnosis not present

## 2020-09-11 DIAGNOSIS — K219 Gastro-esophageal reflux disease without esophagitis: Secondary | ICD-10-CM

## 2020-09-11 DIAGNOSIS — J4521 Mild intermittent asthma with (acute) exacerbation: Secondary | ICD-10-CM

## 2020-09-11 DIAGNOSIS — Z9109 Other allergy status, other than to drugs and biological substances: Secondary | ICD-10-CM | POA: Diagnosis not present

## 2020-09-11 NOTE — Progress Notes (Signed)
Patient ID: Robin Hill, female   DOB: 1950/11/25, 70 y.o.   MRN: [id number]   Subjective:    Patient ID: Robin Hill, female    DOB: 29-Mar-1951, 70 y.o.   MRN: [id number]  HPI This visit occurred during the SARS-CoV-2 public health emergency.  Safety protocols were in place, including screening questions prior to the visit, additional usage of staff PPE, and extensive cleaning of exam room while observing appropriate contact time as indicated for disinfecting solutions.  Patient here for a scheduled follow up.  Here to follow up regarding her cholesterol, asthma and blood sugar. Taking singulair and using symbicort.  Breathing stable.  No increased cough or congestion.  No chest pain or sob reported.  No abdominal pain or bowel change.  Recent bone density normal.  Blood pressure doing well.  Persistent problems with right great toenail.     Past Medical History:  Diagnosis Date  . Allergic rhinitis   . Allergy   . Asthma   . Breast cancer ([place]) 2012   mastectomy with chemo and rad tx  . Diverticulosis   . Dysmenorrhea   . Hypercholesterolemia    Past Surgical History:  Procedure Laterality Date  . BREAST BIOPSY Right 2012   benign  . BREAST SURGERY  2011   breast cancer  . CESAREAN SECTION  1970  . [place]  . CESAREAN SECTION  1987  . COLONOSCOPY WITH PROPOFOL N/A 10/03/2015   Procedure: COLONOSCOPY WITH PROPOFOL;  Surgeon: Manya Silvas, MD;  Location: [facility] ENDOSCOPY;  Service: Endoscopy;  Laterality: N/A;  . MASTECTOMY Left 2012   with chemo and rad tx  . NASAL SINUS SURGERY  07/2001   Family History  Problem Relation Age of Onset  . Cirrhosis Brother        liver  . Breast cancer Paternal Aunt        2 aunts   Social History   Socioeconomic History  . Marital status: Divorced    Spouse name: Not on file  . Number of children: Not on file  . Years of education: Not on file  . Highest education level: Not on file  Occupational History  .  Not on file  Tobacco Use  . Smoking status: Former Smoker    Types: Cigarettes  . Smokeless tobacco: Never Used  Vaping Use  . Vaping Use: Never used  Substance and Sexual Activity  . Alcohol use: Yes    Alcohol/week: 1.0 standard drink    Types: 1 Glasses of wine per week    Comment: OCC  . Drug use: No  . Sexual activity: Never  Other Topics Concern  . Not on file  Social History Narrative  . Not on file   Social Determinants of Health   Financial Resource Strain: Low Risk   . Difficulty of Paying Living Expenses: Not hard at all  Food Insecurity: No Food Insecurity  . Worried About Charity fundraiser in the Last Year: Never true  . Ran Out of Food in the Last Year: Never true  Transportation Needs: No Transportation Needs  . Lack of Transportation (Medical): No  . Lack of Transportation (Non-Medical): No  Physical Activity: Not on file  Stress: No Stress Concern Present  . Feeling of Stress : Not at all  Social Connections: Not on file    Outpatient Encounter Medications as of 09/11/2020  Medication Sig  . Omega-3 Fatty Acids (FISH OIL PO) Take 1 capsule by  mouth daily.  Marland Kitchen anastrozole (ARIMIDEX) 1 MG tablet TAKE 1 TABLET BY MOUTH  DAILY  . b complex vitamins capsule Take 1 capsule by mouth daily.  . budesonide-formoterol (SYMBICORT) 160-4.5 MCG/ACT inhaler USE 2 INHALATIONS BY MOUTH  TWICE DAILY  . CALCIUM CITRATE PO Take by mouth.  . cholecalciferol (VITAMIN D) 400 UNITS TABS Take 400 Units by mouth daily. Chewable  . fluticasone (FLONASE) 50 MCG/ACT nasal spray USE 1 SPRAY INTO EACH  NOSTRIL TWICE A DAY  . omeprazole (PRILOSEC) 10 MG capsule TAKE 1 CAPSULE BY MOUTH  DAILY  . pregabalin (LYRICA) 100 MG capsule Take 1 capsule (100 mg total) by mouth 2 (two) times daily.  . rosuvastatin (CRESTOR) 40 MG tablet Take 1 tablet (40 mg total) by mouth daily.  . vitamin E 400 UNIT capsule Take 400 Units by mouth daily.  . [DISCONTINUED] Albuterol Sulfate (PROAIR HFA IN) 90  mcg. 2 puffs using inhaler four times a day as needed. (Patient not taking: Reported on 07/04/2020)  . [DISCONTINUED] Cyanocobalamin (VITAMIN B12 PO) Take 500 mcg by mouth daily. Administer 1 tablet by mouth once a day (Patient not taking: Reported on 09/11/2020)   No facility-administered encounter medications on file as of 09/11/2020.    Review of Systems  Constitutional: Negative for appetite change and unexpected weight change.  HENT: Negative for congestion and sinus pressure.   Respiratory: Negative for cough, chest tightness and shortness of breath.   Cardiovascular: Negative for chest pain, palpitations and leg swelling.  Gastrointestinal: Negative for abdominal pain, diarrhea, nausea and vomiting.  Genitourinary: Negative for difficulty urinating and dysuria.  Musculoskeletal: Negative for joint swelling and myalgias.  Skin: Negative for color change and rash.  Neurological: Negative for dizziness, light-headedness and headaches.  Psychiatric/Behavioral: Negative for agitation and dysphoric mood.       Objective:    Physical Exam Vitals reviewed.  Constitutional:      General: She is not in acute distress.    Appearance: Normal appearance.  HENT:     Head: Normocephalic and atraumatic.     Right Ear: External ear normal.     Left Ear: External ear normal.  Eyes:     General: No scleral icterus.       Right eye: No discharge.        Left eye: No discharge.     Conjunctiva/sclera: Conjunctivae normal.  Neck:     Thyroid: No thyromegaly.  Cardiovascular:     Rate and Rhythm: Normal rate and regular rhythm.  Pulmonary:     Effort: No respiratory distress.     Breath sounds: Normal breath sounds. No wheezing.  Abdominal:     General: Bowel sounds are normal.     Palpations: Abdomen is soft.     Tenderness: There is no abdominal tenderness.  Musculoskeletal:        General: No swelling or tenderness.     Cervical back: Neck supple. No tenderness.  Lymphadenopathy:      Cervical: No cervical adenopathy.  Skin:    Findings: No erythema or rash.  Neurological:     Mental Status: She is alert.  Psychiatric:        Mood and Affect: Mood normal.        Behavior: Behavior normal.     BP 122/64   Pulse 90   Temp (!) 96.4 F (35.8 C) (Oral)   Resp 16   Ht _0  (1.626 m)   Wt 224 lb (101.6 kg)  SpO2 98%   BMI 38.45 kg/m  Wt Readings from Last 3 Encounters:  09/11/20 224 lb (101.6 kg)  06/12/20 229 lb 6.4 oz (104.1 kg)  05/13/20 223 lb (101.2 kg)     Lab Results  Component Value Date   WBC 6.4 04/02/2020   HGB 12.8 04/02/2020   HCT 39.2 04/02/2020   PLT 198 04/02/2020   GLUCOSE 115 (H) 09/08/2020   CHOL 157 09/08/2020   TRIG 65.0 09/08/2020   HDL 66.60 09/08/2020   LDLDIRECT 126.2 05/09/2013   LDLCALC 77 09/08/2020   ALT 19 09/08/2020   AST 23 09/08/2020   NA 142 09/08/2020   K 4.0 09/08/2020   CL 105 09/08/2020   CREATININE 0.89 09/08/2020   BUN 14 09/08/2020   CO2 31 09/08/2020   TSH 3.35 09/08/2020   HGBA1C 6.3 09/08/2020   MICROALBUR <0.7 09/09/2014    DG Bone Density  Result Date: 01/22/2020 EXAM: DUAL X-RAY ABSORPTIOMETRY (DXA) FOR BONE MINERAL DENSITY IMPRESSION: Your patient Letha Mirabal completed a BMD test on 01/22/2020 using the [place] (software version: 14.10) manufactured by [employer]. The following summarizes the results of our evaluation. Technologist: [facility] PATIENT BIOGRAPHICAL: Name: Natina, [place] Patient ID: [id number] Birth Date: [birth year] Height: 64.0 in. Gender: Female Exam Date: 01/22/2020 Weight: 225.0 lbs. Indications: Breast CA, History of Breast Cancer, History of Chemo, History of Radiation, Postmenopausal Fractures: Treatments: Vitamin D, Calcium, Anastrozole, Multi-Vitamin with calcium DENSITOMETRY RESULTS: Site      Region     Measured Date Measured Age WHO Classification Young Adult T-score BMD         %Change vs. Previous Significant Change (*) AP Spine L1-L4 (L3)  01[age]22/2020 69.9 Normal -1.0 1.060 g/cm2 -6.2% Yes AP Spine L1-L4 (L3) 12/20/2016 66.8 Normal -0.4 1.130 g/cm2 -6.1% Yes AP Spine L1-L4 (L3) 12/26/2012 62.8 Normal 0.2 1.203 g/cm2 - - DualFemur Neck Right 01/22/2020 69.9 Normal 0.1 1.053 g/cm2 -4.7% Yes DualFemur Neck Right 12/20/2016 66.8 Normal 0.5 1.105 g/cm2 4.5% - DualFemur Neck Right 12/26/2012 62.8 Normal 0.1 1.057 g/cm2 - - DualFemur Total Mean 01/22/2020 69.9 Normal 1.1 1.152 g/cm2 0.6% - DualFemur Total Mean 12/20/2016 66.8 Normal 1.1 1.145 g/cm2 0.5% - DualFemur Total Mean 12/26/2012 62.8 Normal 1.0 1.139 g/cm2 - - ASSESSMENT: The BMD measured at AP Spine L1-L4 (L3) is 1.060 g/cm2 with a T-score of -1.0. This patient is considered normal according to [facility]) criteria. The scan quality is good. L-3 was excluded due to degenerative changes. Compared with prior study, there has been significant decrease in the spine. Compared with prior study, there has been no significant change in the total hip. World Pharmacologist [facility]) criteria for post-menopausal, Caucasian Women: Normal:                   T-score at or above -1 SD Osteopenia/low bone mass: T-score between -1 and -2.5 SD Osteoporosis:             T-score at or below -2.5 SD RECOMMENDATIONS: 1. All patients should optimize calcium and vitamin D intake. 2. Consider FDA-approved medical therapies in postmenopausal women and men aged 71 years and older, based on the following: a. A hip or vertebral(clinical or morphometric) fracture b. T-score < -2.5 at the femoral neck or spine after appropriate evaluation to exclude secondary causes c. Low bone mass (T-score between -1.0 and -2.5 at the femoral neck or spine) and a 10-year probability of a hip fracture > 3%  or a 10-year probability of a major osteoporosis-related fracture > 20% based on the [place]-adapted WHO algorithm 3. Clinician judgment and/or patient preferences may indicate treatment for people with 10-year fracture  probabilities above or below these levels FOLLOW-UP: People with diagnosed cases of osteoporosis or at high risk for fracture should have regular bone mineral density tests. For patients eligible for Medicare, routine testing is allowed once every 2 years. The testing frequency can be increased to one year for patients who have rapidly progressing disease, those who are receiving or discontinuing medical therapy to restore bone mass, or have additional risk factors. I have reviewed this report, and agree with the above findings. [facility] Electronically Signed   By: Lowella Grip III M.D.   On: 01/22/2020 10:33       Assessment & Plan:   Problem List Items Addressed This Visit    Asthma    Continue singulair and symbicort.  Has xopenex if needed.  Breathing stable.  Follow.       BMI 38.0-38.9,adult    Discussed diet and exercise.  Follow.        Environmental allergies    Continue singulair.  Stable.       GERD (gastroesophageal reflux disease)    Controlled on omeprazole.        HLD (hyperlipidemia)    Continue crestor.  Low cholesterol diet and exercise.  Follow lipid panel and liver function tests.   Lab Results  Component Value Date   CHOL 157 09/08/2020   HDL 66.60 09/08/2020   LDLCALC 77 09/08/2020   LDLDIRECT 126.2 05/09/2013   TRIG 65.0 09/08/2020   CHOLHDL 2 09/08/2020        Hyperglycemia    Low carb diet and exercise.  Follow met b and a1c.       Nail fungus    Has had issues with nail fungus.  Persistent changes right great toe.  Request referral to podiatry.        Relevant Orders   Ambulatory referral to Podiatry       Einar Pheasant, MD

## 2020-09-13 ENCOUNTER — Encounter: Payer: Self-pay | Admitting: Internal Medicine

## 2020-09-13 NOTE — Assessment & Plan Note (Signed)
Continue singulair and symbicort.  Has xopenex if needed.  Breathing stable.  Follow.

## 2020-09-13 NOTE — Assessment & Plan Note (Signed)
Low carb diet and exercise.  Follow met b and a1c.  

## 2020-09-13 NOTE — Assessment & Plan Note (Signed)
Controlled on omeprazole.   

## 2020-09-13 NOTE — Assessment & Plan Note (Signed)
Has had issues with nail fungus.  Persistent changes right great toe.  Request referral to podiatry.

## 2020-09-13 NOTE — Assessment & Plan Note (Signed)
Discussed diet and exercise.  Follow.  

## 2020-09-13 NOTE — Assessment & Plan Note (Signed)
Continue singulair.  Stable.  

## 2020-09-13 NOTE — Assessment & Plan Note (Signed)
Continue crestor.  Low cholesterol diet and exercise.  Follow lipid panel and liver function tests.   Lab Results  Component Value Date   CHOL 157 09/08/2020   HDL 66.60 09/08/2020   LDLCALC 77 09/08/2020   LDLDIRECT 126.2 05/09/2013   TRIG 65.0 09/08/2020   CHOLHDL 2 09/08/2020

## 2020-10-01 ENCOUNTER — Encounter: Payer: Self-pay | Admitting: Podiatry

## 2020-10-01 ENCOUNTER — Ambulatory Visit: Payer: Medicare Other | Admitting: Podiatry

## 2020-10-01 ENCOUNTER — Other Ambulatory Visit: Payer: Self-pay

## 2020-10-01 DIAGNOSIS — L600 Ingrowing nail: Secondary | ICD-10-CM | POA: Diagnosis not present

## 2020-10-01 DIAGNOSIS — C50919 Malignant neoplasm of unspecified site of unspecified female breast: Secondary | ICD-10-CM | POA: Insufficient documentation

## 2020-10-01 DIAGNOSIS — Z853 Personal history of malignant neoplasm of breast: Secondary | ICD-10-CM | POA: Insufficient documentation

## 2020-10-01 NOTE — Patient Instructions (Signed)

## 2020-10-02 NOTE — Progress Notes (Signed)
  Subjective:  Patient ID: Robin Hill, female    DOB: [birth year],  MRN: [id number]  Chief Complaint  Patient presents with  . Nail Problem    (NP)nail fungus    70 y.o. female presents with the above complaint. History confirmed with patient.  Corner of the right great toenail digs in and hurts  Objective:  Physical Exam: warm, good capillary refill, no trophic changes or ulcerative lesions, normal DP and PT pulses and normal sensory exam.  Right Foot: Ingrowing nail without paronychia right hallux  Radiographs: X-ray of the right foot: Ingrowing nail without paronychia right hallux Assessment:   1. Ingrowing right great toenail      Plan:  Patient was evaluated and treated and all questions answered.    Ingrown Nail, right -Patient elects to proceed with minor surgery to remove ingrown toenail today. Consent reviewed and signed by patient. -Ingrown nail excised. See procedure note. -Educated on post-procedure care including soaking. Written instructions provided and reviewed.   Procedure: Excision of Ingrown Toenail Location: Right 1st toe lateral nail borders. Anesthesia: Lidocaine 1% plain; 1.5 mL and Marcaine 0.5% plain; 1.5 mL, digital block. Skin Prep: Betadine. Dressing: Silvadene; telfa; dry, sterile, compression dressing. Technique: Following skin prep, the toe was exsanguinated and a tourniquet was secured at the base of the toe. The affected nail border was freed, split with a nail splitter, and excised.  She preferred not to do chemical matricectomy so we will let this regrow and see if it is bothersome.  If it recurs will plan for matricectomy in the future.  The tourniquet was then removed and sterile dressing applied. Disposition: Patient tolerated procedure well.   Return if symptoms worsen or fail to improve.

## 2020-11-05 ENCOUNTER — Telehealth: Payer: Self-pay | Admitting: *Deleted

## 2020-11-05 DIAGNOSIS — G629 Polyneuropathy, unspecified: Secondary | ICD-10-CM

## 2020-11-05 MED ORDER — PREGABALIN 100 MG PO CAPS: 100.0000 mg | ORAL_CAPSULE | Freq: Two times a day (BID) | ORAL | 2 refills | Status: DC

## 2020-11-05 NOTE — Telephone Encounter (Signed)
Rx for lyrica requested from pharmacy

## 2020-11-06 NOTE — Telephone Encounter (Signed)
Lyrica script faxed to [facility]

## 2020-12-10 ENCOUNTER — Telehealth: Payer: Self-pay | Admitting: *Deleted

## 2020-12-10 DIAGNOSIS — E785 Hyperlipidemia, unspecified: Secondary | ICD-10-CM

## 2020-12-10 DIAGNOSIS — R739 Hyperglycemia, unspecified: Secondary | ICD-10-CM

## 2020-12-10 NOTE — Telephone Encounter (Signed)
Please place future orders for lab appt.  

## 2020-12-11 ENCOUNTER — Other Ambulatory Visit: Payer: Self-pay

## 2020-12-11 ENCOUNTER — Other Ambulatory Visit (INDEPENDENT_AMBULATORY_CARE_PROVIDER_SITE_OTHER): Payer: Medicare Other

## 2020-12-11 DIAGNOSIS — R739 Hyperglycemia, unspecified: Secondary | ICD-10-CM

## 2020-12-11 DIAGNOSIS — E785 Hyperlipidemia, unspecified: Secondary | ICD-10-CM | POA: Diagnosis not present

## 2020-12-11 LAB — LIPID PANEL
Cholesterol: 167 mg/dL (ref 0–200)
HDL: 67.2 mg/dL (ref >39.00)
LDL Cholesterol: 86 mg/dL (ref 0–99)
NonHDL: 100.04
Total CHOL/HDL Ratio: 2
Triglycerides: 68 mg/dL (ref 0.0–149.0)
VLDL: 13.6 mg/dL (ref 0.0–40.0)

## 2020-12-11 LAB — BASIC METABOLIC PANEL
BUN: 15 mg/dL (ref 6–23)
CO2: 30 mEq/L (ref 19–32)
Calcium: 9.5 mg/dL (ref 8.4–10.5)
Chloride: 105 mEq/L (ref 96–112)
Creatinine, Ser: 0.92 mg/dL (ref 0.40–1.20)
GFR: 62.96 mL/min (ref >60.00)
Glucose, Bld: 111 mg/dL — ABNORMAL HIGH (ref 70–99)
Potassium: 4.5 mEq/L (ref 3.5–5.1)
Sodium: 142 mEq/L (ref 135–145)

## 2020-12-11 LAB — HEPATIC FUNCTION PANEL
ALT: 20 U/L (ref 0–35)
AST: 24 U/L (ref 0–37)
Albumin: 4.1 g/dL (ref 3.5–5.2)
Alkaline Phosphatase: 61 U/L (ref 39–117)
Bilirubin, Direct: 0.1 mg/dL (ref 0.0–0.3)
Total Bilirubin: 0.6 mg/dL (ref 0.2–1.2)
Total Protein: 6.6 g/dL (ref 6.0–8.3)

## 2020-12-11 LAB — HEMOGLOBIN A1C: Hgb A1c MFr Bld: 6.3 % (ref 4.6–6.5)

## 2020-12-11 NOTE — Telephone Encounter (Signed)
Orders placed for labs

## 2020-12-16 ENCOUNTER — Ambulatory Visit (INDEPENDENT_AMBULATORY_CARE_PROVIDER_SITE_OTHER): Payer: Medicare Other | Admitting: Internal Medicine

## 2020-12-16 ENCOUNTER — Other Ambulatory Visit: Payer: Self-pay

## 2020-12-16 VITALS — BP 126/76 | HR 76 | Temp 97.5°F | Resp 16 | Ht 64.0 in | Wt 217.6 lb

## 2020-12-16 DIAGNOSIS — T451X5A Adverse effect of antineoplastic and immunosuppressive drugs, initial encounter: Secondary | ICD-10-CM | POA: Diagnosis not present

## 2020-12-16 DIAGNOSIS — C50919 Malignant neoplasm of unspecified site of unspecified female breast: Secondary | ICD-10-CM | POA: Diagnosis not present

## 2020-12-16 DIAGNOSIS — J4521 Mild intermittent asthma with (acute) exacerbation: Secondary | ICD-10-CM

## 2020-12-16 DIAGNOSIS — Z9109 Other allergy status, other than to drugs and biological substances: Secondary | ICD-10-CM | POA: Diagnosis not present

## 2020-12-16 DIAGNOSIS — Z0000 Encounter for general adult medical examination without abnormal findings: Secondary | ICD-10-CM

## 2020-12-16 DIAGNOSIS — K219 Gastro-esophageal reflux disease without esophagitis: Secondary | ICD-10-CM

## 2020-12-16 DIAGNOSIS — G620 Drug-induced polyneuropathy: Secondary | ICD-10-CM | POA: Diagnosis not present

## 2020-12-16 DIAGNOSIS — R739 Hyperglycemia, unspecified: Secondary | ICD-10-CM

## 2020-12-16 DIAGNOSIS — E785 Hyperlipidemia, unspecified: Secondary | ICD-10-CM

## 2020-12-16 NOTE — Progress Notes (Signed)
Subjective:    Patient ID: Robin Hill, female    DOB: [birth year], 70 y.o.   MRN: [id number]  HPI This visit occurred during the SARS-CoV-2 public health emergency.  Safety protocols were in place, including screening questions prior to the visit, additional usage of staff PPE, and extensive cleaning of exam room while observing appropriate contact time as indicated for disinfecting solutions.   Patient here for her physical exam.  She reports she is doing relatively well.  Saw podiatry 10/01/20 - ingrown toe nail.  Breathing stable.  Taking singulair and using symbicort.  No chest pain.  No acid reflux reported.  No abdominal pain.  Bowels moving.  Has adjusted her diet.  Weight is down. Does report tightness - tissue - s/p surgery.  When stretches - improves.  Bra can aggravate.   Past Medical History:  Diagnosis Date   Allergic rhinitis    Allergy    Asthma    Breast cancer ([place]) 2012   mastectomy with chemo and rad tx   Diverticulosis    Dysmenorrhea    Hypercholesterolemia    Past Surgical History:  Procedure Laterality Date   BREAST BIOPSY Right 2012   benign   BREAST SURGERY  2011   breast cancer   CESAREAN SECTION  [place]   COLONOSCOPY WITH PROPOFOL N/A 10/03/2015   Procedure: COLONOSCOPY WITH PROPOFOL;  Surgeon: Manya Silvas, MD;  Location: [facility];  Service: Endoscopy;  Laterality: N/A;   MASTECTOMY Left 2012   with chemo and rad tx   NASAL SINUS SURGERY  07/2001   Family History  Problem Relation Age of Onset   Cirrhosis Brother        liver   Breast cancer Paternal Aunt        2 aunts   Social History   Socioeconomic History   Marital status: Divorced    Spouse name: Not on file   Number of children: Not on file   Years of education: Not on file   Highest education level: Not on file  Occupational History   Not on file  Tobacco Use   Smoking status: Former    Types: Cigarettes   Smokeless  tobacco: Never  Vaping Use   Vaping Use: Never used  Substance and Sexual Activity   Alcohol use: Yes    Alcohol/week: 1.0 standard drink    Types: 1 Glasses of wine per week    Comment: OCC   Drug use: No   Sexual activity: Never  Other Topics Concern   Not on file  Social History Narrative   Not on file   Social Determinants of Health   Financial Resource Strain: Low Risk    Difficulty of Paying Living Expenses: Not hard at all  Food Insecurity: No Food Insecurity   Worried About Charity fundraiser in the Last Year: Never true   [place] in the Last Year: Never true  Transportation Needs: No Transportation Needs   Lack of Transportation (Medical): No   Lack of Transportation (Non-Medical): No  Physical Activity: Not on file  Stress: No Stress Concern Present   Feeling of Stress : Not at all  Social Connections: Not on file     Review of Systems  Constitutional:  Negative for fatigue and unexpected weight change.  HENT:  Negative for congestion, sinus pressure and sore throat.   Eyes:  Negative for pain  and visual disturbance.  Respiratory:  Negative for cough, chest tightness and shortness of breath.   Cardiovascular:  Negative for chest pain and palpitations.  Gastrointestinal:  Negative for abdominal pain, constipation and diarrhea.  Genitourinary:  Negative for difficulty urinating and frequency.  Musculoskeletal:  Negative for back pain and joint swelling.  Skin:  Negative for color change and rash.  Neurological:  Negative for dizziness and headaches.  Hematological:  Negative for adenopathy. Does not bruise/bleed easily.  Psychiatric/Behavioral:  Negative for decreased concentration and dysphoric mood.       Objective:    Physical Exam Vitals reviewed.  Constitutional:      General: She is not in acute distress.    Appearance: Normal appearance. She is well-developed.  HENT:     Head: Normocephalic and atraumatic.     Right Ear: External ear  normal.     Left Ear: External ear normal.  Eyes:     General: No scleral icterus.       Right eye: No discharge.        Left eye: No discharge.     Conjunctiva/sclera: Conjunctivae normal.  Neck:     Thyroid: No thyromegaly.  Cardiovascular:     Rate and Rhythm: Normal rate and regular rhythm.  Pulmonary:     Effort: No tachypnea, accessory muscle usage or respiratory distress.     Breath sounds: Normal breath sounds. No decreased breath sounds or wheezing.  Chest:  Breasts:    Right: No inverted nipple, mass, nipple discharge or tenderness (no axillary adenopathy).     Left: No inverted nipple, mass, nipple discharge or tenderness (no axilarry adenopathy).  Abdominal:     General: Bowel sounds are normal.     Palpations: Abdomen is soft.     Tenderness: There is no abdominal tenderness.  Musculoskeletal:        General: No swelling or tenderness.     Cervical back: Neck supple.  Lymphadenopathy:     Cervical: No cervical adenopathy.  Skin:    Findings: No erythema or rash.  Neurological:     Mental Status: She is alert and oriented to person, place, and time.  Psychiatric:        Mood and Affect: Mood normal.        Behavior: Behavior normal.    BP 126/76   Pulse 76   Temp (!) 97.5 F (36.4 C)   Resp 16   Ht 5' 4" (1.626 m)   Wt 217 lb 9.6 oz (98.7 kg)   SpO2 98%   BMI 37.35 kg/m  Wt Readings from Last 3 Encounters:  12/16/20 217 lb 9.6 oz (98.7 kg)  09/11/20 224 lb (101.6 kg)  06/12/20 229 lb 6.4 oz (104.1 kg)    Outpatient Encounter Medications as of 12/16/2020  Medication Sig   BIOTIN 5000 PO Take 500 mg by mouth Robin Hill.   vitamin B-12 (CYANOCOBALAMIN) 1000 MCG tablet Take 1,000 mcg by mouth Robin Hill.   anastrozole (ARIMIDEX) 1 MG tablet TAKE 1 TABLET BY MOUTH  Robin Hill   budesonide-formoterol (SYMBICORT) 160-4.5 MCG/ACT inhaler USE 2 INHALATIONS BY MOUTH  TWICE Robin Hill   CALCIUM CITRATE PO Take by mouth.   cholecalciferol (VITAMIN D) 400 UNITS TABS Take 400  Units by mouth Robin Hill. Chewable   Omega-3 Fatty Acids (FISH OIL PO) Take 1 capsule by mouth Robin Hill.   omeprazole (PRILOSEC) 10 MG capsule TAKE 1 CAPSULE BY MOUTH  Robin Hill   pregabalin (LYRICA) 100 MG capsule Take 1 capsule (100  mg total) by mouth 2 (two) times Robin Hill.   rosuvastatin (CRESTOR) 40 MG tablet Take 1 tablet (40 mg total) by mouth Robin Hill.   vitamin E 400 UNIT capsule Take 400 Units by mouth Robin Hill.   [DISCONTINUED] b complex vitamins capsule Take 1 capsule by mouth Robin Hill. (Patient not taking: Reported on 12/16/2020)   [DISCONTINUED] fluticasone (FLONASE) 50 MCG/ACT nasal spray USE 1 SPRAY INTO EACH  NOSTRIL TWICE A DAY (Patient not taking: Reported on 12/16/2020)   No facility-administered encounter medications on file as of 12/16/2020.     Lab Results  Component Value Date   WBC 6.4 04/02/2020   HGB 12.8 04/02/2020   HCT 39.2 04/02/2020   PLT 198 04/02/2020   GLUCOSE 111 (H) 12/11/2020   CHOL 167 12/11/2020   TRIG 68.0 12/11/2020   HDL 67.20 12/11/2020   LDLDIRECT 126.2 05/09/2013   LDLCALC 86 12/11/2020   ALT 20 12/11/2020   AST 24 12/11/2020   NA 142 12/11/2020   K 4.5 12/11/2020   CL 105 12/11/2020   CREATININE 0.92 12/11/2020   BUN 15 12/11/2020   CO2 30 12/11/2020   TSH 3.35 09/08/2020   HGBA1C 6.3 12/11/2020   MICROALBUR <0.7 09/09/2014    DG Bone Density  Result Date: 01/22/2020 EXAM: DUAL X-RAY ABSORPTIOMETRY (DXA) FOR BONE MINERAL DENSITY IMPRESSION: Your patient Robin Hill completed a BMD test on 01/22/2020 using the [employer] (software version: 14.10) manufactured by [employer]. The following summarizes the results of our evaluation. Technologist: [facility] PATIENT BIOGRAPHICAL: Name: Robin Hill, Robin Hill Patient ID: [id number] Birth Date: [birth year] Height: 64.0 in. Gender: Female Exam Date: 01/22/2020 Weight: 225.0 lbs. Indications: Breast CA, History of Breast Cancer, History of Chemo, History of Radiation, Postmenopausal Fractures: Treatments:  Vitamin D, Calcium, Anastrozole, Multi-Vitamin with calcium DENSITOMETRY RESULTS: Site      Region     Measured Date Measured Age WHO Classification Young Adult T-score BMD         %Change vs. Previous Significant Change (*) AP Spine L1-L4 (L3) 01/22/[age] 69.9 Normal -1.0 1.060 g/cm2 -6.2% Yes AP Spine L1-L4 (L3) 12/20/2016 66.8 Normal -0.4 1.130 g/cm2 -6.1% Yes AP Spine L1-L4 (L3) 12[age]26/2012 62.8 Normal 0.2 1.203 g/cm2 - - DualFemur Neck Right 01/22/2020 69.9 Normal 0.1 1.053 g/cm2 -4.7% Yes DualFemur Neck Right 12/20/2016 66.8 Normal 0.5 1.105 g/cm2 4.5% - DualFemur Neck Right 12/26/2012 62.8 Normal 0.1 1.057 g/cm2 - - DualFemur Total Mean 01/22/2020 69.9 Normal 1.1 1.152 g/cm2 0.6% - DualFemur Total Mean 12/20/2016 66.8 Normal 1.1 1.145 g/cm2 0.5% - DualFemur Total Mean 12/26/2012 62.8 Normal 1.0 1.139 g/cm2 - - ASSESSMENT: The BMD measured at AP Spine L1-L4 (L3) is 1.060 g/cm2 with a T-score of -1.0. This patient is considered normal according to [facility] criteria. The scan quality is good. L-3 was excluded due to degenerative changes. Compared with prior study, there has been significant decrease in the spine. Compared with prior study, there has been no significant change in the total hip. [facility] Pharmacologist [facility]) criteria for post-menopausal, Caucasian Women: Normal:                   T-score at or above -1 SD Osteopenia/low bone mass: T-score between -1 and -2.5 SD Osteoporosis:             T-score at or below -2.5 SD RECOMMENDATIONS: 1. All patients should optimize calcium and vitamin D intake. 2. Consider FDA-approved medical therapies in postmenopausal women and men aged  61 years and older, based on the following: a. A hip or vertebral(clinical or morphometric) fracture b. T-score < -2.5 at the femoral neck or spine after appropriate evaluation to exclude secondary causes c. Low bone mass (T-score between -1.0 and -2.5 at the femoral neck or spine) and a 10-year probability  of a hip fracture > 3% or a 10-year probability of a major osteoporosis-related fracture > 20% based on the [place]-adapted WHO algorithm 3. Clinician judgment and/or patient preferences may indicate treatment for people with 10-year fracture probabilities above or below these levels FOLLOW-UP: People with diagnosed cases of osteoporosis or at high risk for fracture should have regular bone mineral density tests. For patients eligible for Medicare, routine testing is allowed once every 2 years. The testing frequency can be increased to one year for patients who have rapidly progressing disease, those who are receiving or discontinuing medical therapy to restore bone mass, or have additional risk factors. I have reviewed this report, and agree with the above findings. [facility] Electronically Signed   By: Lowella Grip III M.D.   On: 01/22/2020 10:33       Assessment & Plan:   Problem List Items Addressed This Visit     Asthma    Continue singulair and symbicort.  Breathing stable.  Follow.       Chemotherapy-induced neuropathy ([place])    Continue lyrica.        Environmental allergies    Continue singulair.  Stable.       GERD (gastroesophageal reflux disease)    Controlled on omeprazole.       Health care maintenance    Physical today 12/16/20.  PAP 08/24/17.  Mammogram scheduled 12/17/20.  Colonoscopy 10/2015.       HLD (hyperlipidemia)    Continue crestor.  Low cholesterol diet and exercise.  Follow lipid panel and liver function tests.   Lab Results  Component Value Date   CHOL 167 12/11/2020   HDL 67.20 12/11/2020   LDLCALC 86 12/11/2020   LDLDIRECT 126.2 05/09/2013   TRIG 68.0 12/11/2020   CHOLHDL 2 12/11/2020       Relevant Orders   Basic metabolic panel   CBC with Differential/Platelet   Hepatic function panel   Lipid panel   TSH   Hyperglycemia    Low carb diet and exercise.  Follow met b and a1c.       Relevant Orders   Hemoglobin A1c    Malignant neoplasm of breast (HCC)    Continue arimidex.  Followed by oncology.       Other Visit Diagnoses     Routine general medical examination at a health care facility    -  Primary        Einar Pheasant, MD

## 2020-12-17 ENCOUNTER — Ambulatory Visit
Admission: RE | Admit: 2020-12-17 | Discharge: 2020-12-17 | Disposition: A | Discharge status: Home / Self Care | Payer: Medicare Other | Source: Ambulatory Visit | Attending: Internal Medicine | Admitting: Internal Medicine

## 2020-12-17 DIAGNOSIS — Z170 Estrogen receptor positive status [ER+]: Secondary | ICD-10-CM | POA: Insufficient documentation

## 2020-12-17 DIAGNOSIS — Z1231 Encounter for screening mammogram for malignant neoplasm of breast: Secondary | ICD-10-CM | POA: Diagnosis not present

## 2020-12-17 DIAGNOSIS — C50812 Malignant neoplasm of overlapping sites of left female breast: Secondary | ICD-10-CM | POA: Insufficient documentation

## 2020-12-21 ENCOUNTER — Encounter: Payer: Self-pay | Admitting: Internal Medicine

## 2020-12-21 NOTE — Assessment & Plan Note (Signed)
Continue crestor.  Low cholesterol diet and exercise.  Follow lipid panel and liver function tests.   Lab Results  Component Value Date   CHOL 167 12/11/2020   HDL 67.20 12/11/2020   LDLCALC 86 12/11/2020   LDLDIRECT 126.2 05/09/2013   TRIG 68.0 12/11/2020   CHOLHDL 2 12/11/2020

## 2020-12-21 NOTE — Assessment & Plan Note (Signed)
Continue singulair and symbicort.  Breathing stable.  Follow.  

## 2020-12-21 NOTE — Assessment & Plan Note (Signed)
Physical today 12/16/20.  PAP 08/24/17.  Mammogram scheduled 12/17/20.  Colonoscopy 10/2015.

## 2020-12-21 NOTE — Assessment & Plan Note (Signed)
Continue singulair.  Stable.  

## 2020-12-21 NOTE — Assessment & Plan Note (Signed)
Low carb diet and exercise.  Follow met b and a1c.  

## 2020-12-21 NOTE — Assessment & Plan Note (Signed)
Continue arimidex.  Followed by oncology.

## 2020-12-21 NOTE — Assessment & Plan Note (Signed)
Continue lyrica 

## 2020-12-21 NOTE — Assessment & Plan Note (Signed)
Controlled on omeprazole.   

## 2020-12-30 ENCOUNTER — Inpatient Hospital Stay: Payer: Medicare Other | Admitting: Oncology

## 2020-12-30 ENCOUNTER — Other Ambulatory Visit: Payer: Self-pay

## 2020-12-30 ENCOUNTER — Inpatient Hospital Stay: Payer: Medicare Other | Attending: Internal Medicine

## 2020-12-30 ENCOUNTER — Encounter: Payer: Self-pay | Admitting: Oncology

## 2020-12-30 VITALS — BP 144/76 | HR 69 | Temp 97.5°F | Resp 18 | Wt 218.6 lb

## 2020-12-30 DIAGNOSIS — C50812 Malignant neoplasm of overlapping sites of left female breast: Secondary | ICD-10-CM | POA: Diagnosis not present

## 2020-12-30 DIAGNOSIS — Z170 Estrogen receptor positive status [ER+]: Secondary | ICD-10-CM | POA: Diagnosis not present

## 2020-12-30 LAB — CBC WITH DIFFERENTIAL/PLATELET
Abs Immature Granulocytes: 0.01 10*3/uL (ref 0.00–0.07)
Basophils Absolute: 0 10*3/uL (ref 0.0–0.1)
Basophils Relative: 1 %
Eosinophils Absolute: 0.4 10*3/uL (ref 0.0–0.5)
Eosinophils Relative: 7 %
HCT: 36.1 % (ref 36.0–46.0)
Hemoglobin: 11.8 g/dL — ABNORMAL LOW (ref 12.0–15.0)
Immature Granulocytes: 0 %
Lymphocytes Relative: 32 %
Lymphs Abs: 1.7 10*3/uL (ref 0.7–4.0)
MCH: 30.2 pg (ref 26.0–34.0)
MCHC: 32.7 g/dL (ref 30.0–36.0)
MCV: 92.3 fL (ref 80.0–100.0)
Monocytes Absolute: 0.5 10*3/uL (ref 0.1–1.0)
Monocytes Relative: 10 %
Neutro Abs: 2.7 10*3/uL (ref 1.7–7.7)
Neutrophils Relative %: 50 %
Platelets: 172 10*3/uL (ref 150–400)
RBC: 3.91 MIL/uL (ref 3.87–5.11)
RDW: 12.4 % (ref 11.5–15.5)
WBC: 5.3 10*3/uL (ref 4.0–10.5)
nRBC: 0 % (ref 0.0–0.2)

## 2020-12-30 LAB — COMPREHENSIVE METABOLIC PANEL
ALT: 21 U/L (ref 0–44)
AST: 26 U/L (ref 15–41)
Albumin: 4 g/dL (ref 3.5–5.0)
Alkaline Phosphatase: 58 U/L (ref 38–126)
Anion gap: 6 (ref 5–15)
BUN: 11 mg/dL (ref 8–23)
CO2: 29 mmol/L (ref 22–32)
Calcium: 9.2 mg/dL (ref 8.9–10.3)
Chloride: 103 mmol/L (ref 98–111)
Creatinine, Ser: 0.84 mg/dL (ref 0.44–1.00)
GFR, Estimated: >60 mL/min (ref >60)
Glucose, Bld: 120 mg/dL — ABNORMAL HIGH (ref 70–99)
Potassium: 4.2 mmol/L (ref 3.5–5.1)
Sodium: 138 mmol/L (ref 135–145)
Total Bilirubin: 0.5 mg/dL (ref 0.3–1.2)
Total Protein: 6.9 g/dL (ref 6.5–8.1)

## 2020-12-30 NOTE — Progress Notes (Signed)
Pt here for Breast Ca follow up. Pt thinks she is gonna have her AI stopped at this visit as she is 10 years out. Feels well.

## 2020-12-30 NOTE — Progress Notes (Signed)
[facility] OFFICE PROGRESS NOTE  Patient Care Team: Einar Pheasant, MD as PCP - General (Internal Medicine) Cammie Sickle, MD as Consulting Physician (Hematology and Oncology)  Cancer Staging No matching staging information was found for the patient.   Oncology History Overview Note  # 2011- LEFT BREAST [Dr.Smith; Dr.Pandit] Stage I IDC with lobular features pT1c (1.9cm) pSNmi-s/p Lumpec & RT; AC- Taxol x10 [sec to PN]; ER/PR-Pos; Her 2 NEU. [facility]- Aug 2017- Low risk of recurrence 1.3%; however- benefit from extended AI [node pos pt]  # 2012- RIGHT BREAST DCIS s/p mastectomy- on AI'  # MAY 2018- BMD- Normal.   # PN- G2- on Lyrica  # JEWOHA'S WITNESS -----------------------------------------------------------------  DIAGNOSIS: LEFT BREAST CA  STAGE:   I      ;GOALS: curative  CURRENT/MOST RECENT THERAPY: Arimidex    Carcinoma of overlapping sites of left breast in female, estrogen receptor positive ([place])     INTERVAL HISTORY:  Robin Hill 70 y.o.  female with past medical history significant for asthma, GERD, diverticulosis, hyperlipidemia, obesity and left breast cancer who is followed by Dr. Rogue Bussing.  She is currently on Arimidex and will complete 10 years of extended treatment this month.   She had mammogram on 12/17/2020 which was BI-RADS Category 1.  Bone density from 01/22/2020 shows a T score of -1.0.  She reports doing well since her last visit.  She lives at home alone.  Most of her free time is spent in her garden planting flowers and produce.  She has been growing pineapples.  She reports occasional discomfort in her left axilla especially when her bra strap touches her scar site.  She is back at the gym and goes daily.  Otherwise, she has been doing well.  Denies any recent hospitalizations.  Review of Systems  Constitutional:  Negative for chills, diaphoresis, fever, malaise/fatigue and weight loss.       Left axillary discomfort   HENT:  Negative for nosebleeds and sore throat.   Eyes:  Negative for double vision.  Respiratory:  Negative for cough, hemoptysis, sputum production, shortness of breath and wheezing.   Cardiovascular:  Negative for chest pain, palpitations, orthopnea and leg swelling.  Gastrointestinal:  Negative for abdominal pain, blood in stool, constipation, diarrhea, heartburn, melena, nausea and vomiting.  Genitourinary:  Negative for dysuria, frequency and urgency.  Musculoskeletal:  Negative for back pain and joint pain.  Skin: Negative.  Negative for itching and rash.  Neurological:  Negative for dizziness, tingling, focal weakness, weakness and headaches.  Endo/Heme/Allergies:  Does not bruise/bleed easily.  Psychiatric/Behavioral:  Negative for depression. The patient is not nervous/anxious and does not have insomnia.    PAST MEDICAL HISTORY :  Past Medical History:  Diagnosis Date   Allergic rhinitis    Allergy    Asthma    Breast cancer ([place]) 2012   mastectomy with chemo and rad tx   Diverticulosis    Dysmenorrhea    Hypercholesterolemia     PAST SURGICAL HISTORY :   Past Surgical History:  Procedure Laterality Date   BREAST BIOPSY Right 2012   benign   BREAST SURGERY  2011   breast cancer   [place]   COLONOSCOPY WITH PROPOFOL N/A 10/03/2015   Procedure: COLONOSCOPY WITH PROPOFOL;  Surgeon: Manya Silvas, MD;  Location: [facility] ENDOSCOPY;  Service: Endoscopy;  Laterality: N/A;   MASTECTOMY Left 2012  with chemo and rad tx   NASAL SINUS SURGERY  07/2001    FAMILY HISTORY :   Family History  Problem Relation Age of Onset   Cirrhosis Brother        liver   Breast cancer Paternal Aunt        2 aunts    SOCIAL HISTORY:   Social History   Tobacco Use   Smoking status: Former    Types: Cigarettes   Smokeless tobacco: Never  Vaping Use   Vaping Use: Never used  Substance Use Topics   Alcohol use: Yes     Alcohol/week: 1.0 standard drink    Types: 1 Glasses of wine per week    Comment: OCC   Drug use: No    ALLERGIES:  has No Known Allergies.  MEDICATIONS:  Current Outpatient Medications  Medication Sig Dispense Refill   anastrozole (ARIMIDEX) 1 MG tablet TAKE 1 TABLET BY MOUTH  DAILY 90 tablet 2   BIOTIN 5000 PO Take 500 mg by mouth daily.     budesonide-formoterol (SYMBICORT) 160-4.5 MCG/ACT inhaler USE 2 INHALATIONS BY MOUTH  TWICE DAILY 30.6 g 3   CALCIUM CITRATE PO Take by mouth.     cholecalciferol (VITAMIN D) 400 UNITS TABS Take 400 Units by mouth daily. Chewable     Omega-3 Fatty Acids (FISH OIL PO) Take 1 capsule by mouth daily.     omeprazole (PRILOSEC) 10 MG capsule TAKE 1 CAPSULE BY MOUTH  DAILY 90 capsule 3   pregabalin (LYRICA) 100 MG capsule Take 1 capsule (100 mg total) by mouth 2 (two) times daily. 180 capsule 2   rosuvastatin (CRESTOR) 40 MG tablet Take 1 tablet (40 mg total) by mouth daily. 90 tablet 3   vitamin B-12 (CYANOCOBALAMIN) 1000 MCG tablet Take 1,000 mcg by mouth daily.     vitamin E 400 UNIT capsule Take 400 Units by mouth daily.     No current facility-administered medications for this visit.    PHYSICAL EXAMINATION: ECOG PERFORMANCE STATUS: 0 - Asymptomatic  BP (!) 144/76 (BP Location: Right Arm, Patient Position: Sitting)   Pulse 69   Temp (!) 97.5 F (36.4 C) (Tympanic)   Resp 18   Wt 218 lb 9.6 oz (99.2 kg)   BMI 37.52 kg/m   Filed Weights   12/30/20 1312  Weight: 218 lb 9.6 oz (99.2 kg)    Physical Exam Constitutional:      Appearance: Normal appearance.  HENT:     Head: Normocephalic and atraumatic.  Eyes:     Pupils: Pupils are equal, round, and reactive to light.  Cardiovascular:     Rate and Rhythm: Normal rate and regular rhythm.     Heart sounds: Normal heart sounds. No murmur heard. Pulmonary:     Effort: Pulmonary effort is normal.     Breath sounds: Normal breath sounds. No wheezing.  Chest:  Breasts:    Right:  Normal.     Left: Absent.     Comments: Right breast-NED Left axilla- NED  Abdominal:     General: Bowel sounds are normal. There is no distension.     Palpations: Abdomen is soft.     Tenderness: There is no abdominal tenderness.  Musculoskeletal:        General: Normal range of motion.     Cervical back: Normal range of motion.  Skin:    General: Skin is warm and dry.     Findings: No rash.  Neurological:  Mental Status: She is alert and oriented to person, place, and time.     Gait: Gait is intact.  Psychiatric:        Mood and Affect: Mood and affect normal.        Cognition and Memory: Memory normal.        Judgment: Judgment normal.    LABORATORY DATA:  I have reviewed the data as listed    Component Value Date/Time   NA 142 12/11/2020 0828   NA 142 11/03/2011 0000   K 4.5 12/11/2020 0828   CL 105 12/11/2020 0828   CO2 30 12/11/2020 0828   GLUCOSE 111 (H) 12/11/2020 0828   BUN 15 12/11/2020 0828   BUN 14 11/03/2011 0000   CREATININE 0.92 12/11/2020 0828   CREATININE 1.04 12/18/2012 1406   CALCIUM 9.5 12/11/2020 0828   PROT 6.6 12/11/2020 0828   PROT 7.0 12/18/2012 1406   ALBUMIN 4.1 12/11/2020 0828   ALBUMIN 3.7 12/18/2012 1406   AST 24 12/11/2020 0828   AST 23 12/18/2012 1406   ALT 20 12/11/2020 0828   ALT 32 12/18/2012 1406   ALKPHOS 61 12/11/2020 0828   ALKPHOS 91 12/18/2012 1406   BILITOT 0.6 12/11/2020 0828   BILITOT 0.6 12/18/2012 1406   GFRNONAA >60 12/31/2019 1339   GFRNONAA 58 (L) 12/18/2012 1406   GFRAA >60 12/31/2019 1339   GFRAA >60 12/18/2012 1406    No results found for: SPEP, UPEP  Lab Results  Component Value Date   WBC 5.3 12/30/2020   NEUTROABS 2.7 12/30/2020   HGB 11.8 (L) 12/30/2020   HCT 36.1 12/30/2020   MCV 92.3 12/30/2020   PLT 172 12/30/2020      Chemistry      Component Value Date/Time   NA 142 12/11/2020 0828   NA 142 11/03/2011 0000   K 4.5 12/11/2020 0828   CL 105 12/11/2020 0828   CO2 30 12/11/2020 0828    BUN 15 12/11/2020 0828   BUN 14 11/03/2011 0000   CREATININE 0.92 12/11/2020 0828   CREATININE 1.04 12/18/2012 1406   GLU 95 11/03/2011 0000      Component Value Date/Time   CALCIUM 9.5 12/11/2020 0828   ALKPHOS 61 12/11/2020 0828   ALKPHOS 91 12/18/2012 1406   AST 24 12/11/2020 0828   AST 23 12/18/2012 1406   ALT 20 12/11/2020 0828   ALT 32 12/18/2012 1406   BILITOT 0.6 12/11/2020 0828   BILITOT 0.6 12/18/2012 1406       RADIOGRAPHIC STUDIES: I have personally reviewed the radiological images as listed and agreed with the findings in the report. No results found.   ASSESSMENT & PLAN:  Left breast cancer- She is status post left mastectomy and is currently on adjuvant aromatase inhibitor.  Most recent mammogram from July 2022 was BI-RADS Category 1 negative.  On an extended course of aromatase inhibitor therapy. She will complete 10 years of treatment this month.  Plan is for her to complete the rest of her pillbox and stop at that time.  She estimates approximately 1 to 2 weeks worth left.  She will need to continue annual mammograms.  She likely can be discharged to her PCP.  We will verify with Dr. Rogue Bussing.  Osteopenia- Bone density scan from 01/22/2020 showed a T score of -1.0.  Continue calcium and vitamin D.  She will be completing her aromatase inhibitor this month.  Anemia- Appears to be stable.  Hemoglobin was 11.8 on 12/30/2020.  Labs checked  on 12/31/2019 which showed ferritin of 61, iron saturation 32%.  Folate normal.  Left axillary discomfort- Secondary to lymph node dissection.  Reports improvement after working out the gym.  May need to get new bras that do not reach her axilla.  Disposition- Continue annual mammogram.  Patient likely can be discharged to PCP given she is greater than 10 years out from diagnosis and treatment.  She completed 10 years of aromatase inhibitor.  I spent 25 minutes dedicated to the care of this patient (face-to-face and  non-face-to-face) on the date of the encounter to include what is described in the assessment and plan.  No problem-specific Assessment & Plan notes found for this encounter.   No orders of the defined types were placed in this encounter.  All questions were answered. The patient knows to call the clinic with any problems, questions or concerns.      Robin Hawking, NP 12/30/2020 1:22 PM

## 2021-01-29 ENCOUNTER — Other Ambulatory Visit: Payer: Self-pay | Admitting: Internal Medicine

## 2021-02-11 ENCOUNTER — Telehealth: Payer: Self-pay | Admitting: Internal Medicine

## 2021-02-11 DIAGNOSIS — M6530 Trigger finger, unspecified finger: Secondary | ICD-10-CM

## 2021-02-11 NOTE — Telephone Encounter (Signed)
Patient is requesting a referral to get a shot in her left finger. Patient states she has a trigger finger.

## 2021-02-11 NOTE — Telephone Encounter (Signed)
Ok to refer patient? If so, where does this need to go?

## 2021-02-12 NOTE — Telephone Encounter (Signed)
Orthopedics.  Please confirm if she has a preference of which orthopedist she desires to see.

## 2021-02-13 NOTE — Addendum Note (Signed)
Addended by: Alisa Graff on: 02/13/2021 03:56 PM   Modules accepted: Orders

## 2021-02-13 NOTE — Telephone Encounter (Signed)
Patient does not have preference of orthopedist.

## 2021-02-13 NOTE — Telephone Encounter (Signed)
Order placed for ortho referral.   

## 2021-03-13 DIAGNOSIS — M65312 Trigger thumb, left thumb: Secondary | ICD-10-CM | POA: Diagnosis not present

## 2021-04-14 ENCOUNTER — Telehealth (INDEPENDENT_AMBULATORY_CARE_PROVIDER_SITE_OTHER): Payer: Medicare Other | Admitting: Family

## 2021-04-14 ENCOUNTER — Telehealth: Payer: Self-pay | Admitting: Internal Medicine

## 2021-04-14 DIAGNOSIS — U071 COVID-19: Secondary | ICD-10-CM

## 2021-04-14 DIAGNOSIS — R051 Acute cough: Secondary | ICD-10-CM | POA: Diagnosis not present

## 2021-04-14 MED ORDER — NIRMATRELVIR/RITONAVIR (PAXLOVID)TABLET: 3.0000 | ORAL_TABLET | Freq: Two times a day (BID) | ORAL | 0 refills | Status: AC

## 2021-04-14 NOTE — Progress Notes (Signed)
Virtual Visit via Video   I connected with patient on 04/14/21 at 12:45 PM EST by a video enabled telemedicine application and verified that I am speaking with the correct person using two identifiers.  Location patient: Home Location provider: [employer], Office Persons participating in the virtual visit: Patient, Provider, CMA  I discussed the limitations of evaluation and management by telemedicine and the availability of in person appointments. The patient expressed understanding and agreed to proceed.  Subjective:   HPI:   70 year old female presents via video visit after testing positive for COVID-19 by rapid antigen test at home. She reports symptoms as mild. Has cough with productive clear phlegm, fever, chills. No SOB. She is interested in Paxlovid. Vaccinated and boosted.  Reports having a sister who died of covid a couple years ago.   ROS:   See pertinent positives and negatives per HPI.  Patient Active Problem List   Diagnosis Date Noted   Malignant neoplasm of breast ([place]) 10/01/2020   Pendulous breast 04/13/2020   Chemotherapy-induced neuropathy ([place]) 11/22/2019   Numbness 11/22/2019   Right foot pain 11/22/2019   Tingling 11/22/2019   Vaginal discharge 11/13/2019   Neuropathy of right thigh 10/26/2019   Nodule of groin 10/01/2019   Leg erythema 10/08/2018   Chest tightness 02/26/2018   Shoulder pain, right 07/07/2017   Carcinoma of overlapping sites of left breast in female, estrogen receptor positive ([place]) 12/26/2015   History of colonic polyps 10/08/2015   Head pain 03/02/2015   Health care maintenance 09/08/2014   Hyperglycemia 09/08/2014   Acute bronchitis 07/31/2014   Asthma 07/31/2014   BMI 38.0-38.9,adult 10/27/2013   Nail fungus 05/16/2013   Laryngitis 04/02/2013   HLD (hyperlipidemia) 10/02/2012   GERD (gastroesophageal reflux disease) 10/02/2012   Environmental allergies 10/02/2012   Diverticulosis 10/02/2012    Social History    Tobacco Use   Smoking status: Former    Types: Cigarettes   Smokeless tobacco: Never  Substance Use Topics   Alcohol use: Yes    Alcohol/week: 1.0 standard drink    Types: 1 Glasses of wine per week    Comment: OCC    Current Outpatient Medications:    anastrozole (ARIMIDEX) 1 MG tablet, TAKE 1 TABLET BY MOUTH  DAILY, Disp: 90 tablet, Rfl: 2   BIOTIN 5000 PO, Take 500 mg by mouth daily., Disp: , Rfl:    budesonide-formoterol (SYMBICORT) 160-4.5 MCG/ACT inhaler, USE 2 INHALATIONS BY MOUTH  TWICE DAILY, Disp: 30.6 g, Rfl: 3   CALCIUM CITRATE PO, Take by mouth., Disp: , Rfl:    cholecalciferol (VITAMIN D) 400 UNITS TABS, Take 400 Units by mouth daily. Chewable, Disp: , Rfl:    nirmatrelvir/ritonavir EUA (PAXLOVID) 20 x 150 MG & 10 x 100MG  TABS, Take 3 tablets by mouth 2 (two) times daily for 5 days. (Take nirmatrelvir 150 mg two tablets twice daily for 5 days and ritonavir 100 mg one tablet twice daily for 5 days) Patient GFR is >60, Disp: 30 tablet, Rfl: 0   Omega-3 Fatty Acids (FISH OIL PO), Take 1 capsule by mouth daily., Disp: , Rfl:    omeprazole (PRILOSEC) 10 MG capsule, TAKE 1 CAPSULE BY MOUTH  DAILY, Disp: 90 capsule, Rfl: 3   pregabalin (LYRICA) 100 MG capsule, Take 1 capsule (100 mg total) by mouth 2 (two) times daily., Disp: 180 capsule, Rfl: 2   rosuvastatin (CRESTOR) 40 MG tablet, Take 1 tablet (40 mg total) by mouth daily., Disp: 90 tablet, Rfl: 3  vitamin B-12 (CYANOCOBALAMIN) 1000 MCG tablet, Take 1,000 mcg by mouth daily., Disp: , Rfl:    vitamin E 400 UNIT capsule, Take 400 Units by mouth daily., Disp: , Rfl:   No Known Allergies  Objective:   There were no vitals taken for this visit.  Patient is well-developed, well-nourished in no acute distress.  Resting comfortably  at home.  Head is normocephalic, atraumatic.  No labored breathing.  Speech is clear and coherent with logical content.  Patient is alert and oriented at baseline.    Assessment and Plan:    Allee was seen today for covid positive.  Diagnoses and all orders for this visit:  COVID-19  Acute cough  Other orders -     nirmatrelvir/ritonavir EUA (PAXLOVID) 20 x 150 MG & 10 x 100MG  TABS; Take 3 tablets by mouth 2 (two) times daily for 5 days. (Take nirmatrelvir 150 mg two tablets twice daily for 5 days and ritonavir 100 mg one tablet twice daily for 5 days) Patient GFR is >60     Kennyth Arnold, FNP 04/14/2021

## 2021-04-14 NOTE — Telephone Encounter (Signed)
Pt tested positive to covid last night 12/12. Pt states she is only experiencing a slight sore throat due to drainage. Pt states she doesn't feel like she needs medication or an appt. Pt would like to know exactly what she should be doing.

## 2021-04-14 NOTE — Telephone Encounter (Signed)
Spoke with patient. She states that she is having very mild symptoms. Would like to discuss quarantine process, etc. Scheduled with [place] this PM

## 2021-04-16 ENCOUNTER — Other Ambulatory Visit: Payer: Self-pay | Admitting: Internal Medicine

## 2021-04-16 DIAGNOSIS — E785 Hyperlipidemia, unspecified: Secondary | ICD-10-CM

## 2021-05-14 ENCOUNTER — Ambulatory Visit: Payer: Medicare Other

## 2021-08-12 ENCOUNTER — Telehealth: Payer: Self-pay | Admitting: Internal Medicine

## 2021-08-12 NOTE — Telephone Encounter (Signed)
Left message for patient to schedule Annual Wellness Visit.  Please schedule with Nurse Health Advisor Denisa O'Brien-Blaney, LPN at [facility].  Please call [phone] ask for Juliann Pulse  ?

## 2021-08-12 NOTE — Telephone Encounter (Signed)
Copied from [place] [id number]. Topic: Medicare AWV ?>> Aug 12, 2021 10:50 AM Harris-Coley, Hannah Beat wrote: ?Reason for CRM: Left message for patient to schedule Annual Wellness Visit.  Please schedule with Nurse Health Advisor Denisa O'Brien-Blaney, LPN at [facility].  Please call [phone] ask for Juliann Pulse ?

## 2021-08-14 ENCOUNTER — Telehealth: Payer: Self-pay

## 2021-08-14 ENCOUNTER — Ambulatory Visit (INDEPENDENT_AMBULATORY_CARE_PROVIDER_SITE_OTHER): Payer: Medicare Other

## 2021-08-14 VITALS — Ht 64.0 in | Wt 218.0 lb

## 2021-08-14 DIAGNOSIS — Z0000 Encounter for general adult medical examination without abnormal findings: Secondary | ICD-10-CM | POA: Diagnosis not present

## 2021-08-14 NOTE — Progress Notes (Signed)
Subjective:   Robin Hill is a 71 y.o. female who presents for Medicare Annual (Subsequent) preventive examination.  Review of Systems    No ROS.  Medicare Wellness Virtual Visit.  Visual/audio telehealth visit, UTA vital signs.   See social history for additional risk factors.   Cardiac Risk Factors include: advanced age (>59men, >53 women)     Objective:    Today's Vitals   08/14/21 1355  Weight: 218 lb (98.9 kg)  Height: 5\' 4"  (1.626 m)   Body mass index is 37.42 kg/m.     08/14/2021    2:02 PM 12/30/2020    1:07 PM 05/13/2020    1:19 PM 12/29/2018    1:49 PM 06/05/2018    2:11 PM 12/28/2017    2:39 PM 06/02/2017    3:49 PM  Advanced Directives  Does Patient Have a Medical Advance Directive? Yes Yes Yes Yes No Yes Yes  Type of Estate agent of [place];Living will Healthcare Power of [place];Living will Healthcare Power of [place];Living will Healthcare Power of [place];Living will  Living will;Healthcare Power of [employer] Power of [place];Living will  Does patient want to make changes to medical advance directive? No - Patient declined No - Patient declined No - Patient declined No - Patient declined  No - Patient declined No - Patient declined  Copy of Healthcare Power of Attorney in Chart? Yes - validated most recent copy scanned in chart (See row information) Yes - validated most recent copy scanned in chart (See row information) Yes - validated most recent copy scanned in chart (See row information) No - copy requested  Yes Yes  Would patient like information on creating a medical advance directive?    No - Patient declined No - Patient declined      Current Medications (verified) Outpatient Encounter Medications as of 08/14/2021  Medication Sig   anastrozole (ARIMIDEX) 1 MG tablet TAKE 1 TABLET BY MOUTH  DAILY   BIOTIN 5000 PO Take 500 mg by mouth daily.   budesonide-formoterol (SYMBICORT) 160-4.5 MCG/ACT inhaler USE 2 INHALATIONS  BY MOUTH  TWICE DAILY   CALCIUM CITRATE PO Take by mouth.   cholecalciferol (VITAMIN D) 400 UNITS TABS Take 400 Units by mouth daily. Chewable   Omega-3 Fatty Acids (FISH OIL PO) Take 1 capsule by mouth daily.   omeprazole (PRILOSEC) 10 MG capsule TAKE 1 CAPSULE BY MOUTH  DAILY   pregabalin (LYRICA) 100 MG capsule Take 1 capsule (100 mg total) by mouth 2 (two) times daily.   rosuvastatin (CRESTOR) 40 MG tablet TAKE 1 TABLET BY MOUTH  DAILY   vitamin B-12 (CYANOCOBALAMIN) 1000 MCG tablet Take 1,000 mcg by mouth daily.   vitamin E 400 UNIT capsule Take 400 Units by mouth daily.   No facility-administered encounter medications on file as of 08/14/2021.    Allergies (verified) Patient has no known allergies.   History: Past Medical History:  Diagnosis Date   Allergic rhinitis    Allergy    Asthma    Breast cancer (HCC) 2012   mastectomy with chemo and rad tx   Diverticulosis    Dysmenorrhea    Hypercholesterolemia    Past Surgical History:  Procedure Laterality Date   BREAST BIOPSY Right 2012   benign   BREAST SURGERY  2011   breast cancer   CESAREAN SECTION  1970   CESAREAN SECTION  1973   CESAREAN SECTION  1987   COLONOSCOPY WITH PROPOFOL N/A 10/03/2015   Procedure: COLONOSCOPY  WITH PROPOFOL;  Surgeon: Scot Jun, MD;  Location: [facility] ENDOSCOPY;  Service: Endoscopy;  Laterality: N/A;   MASTECTOMY Left 2012   with chemo and rad tx   NASAL SINUS SURGERY  07/2001   Family History  Problem Relation Age of Onset   Cirrhosis Brother        liver   Breast cancer Paternal Aunt        2 aunts   Social History   Socioeconomic History   Marital status: Divorced    Spouse name: Not on file   Number of children: Not on file   Years of education: Not on file   Highest education level: Not on file  Occupational History   Not on file  Tobacco Use   Smoking status: Former    Types: Cigarettes   Smokeless tobacco: Never  Vaping Use   Vaping Use: Never used  Substance  and Sexual Activity   Alcohol use: Yes    Alcohol/week: 1.0 standard drink    Types: 1 Glasses of wine per week    Comment: OCC   Drug use: No   Sexual activity: Never  Other Topics Concern   Not on file  Social History Narrative   Not on file   Social Determinants of Health   Financial Resource Strain: Low Risk    Difficulty of Paying Living Expenses: Not hard at all  Food Insecurity: No Food Insecurity   Worried About Programme researcher, broadcasting/film/video in the Last Year: Never true   Ran Out of Food in the Last Year: Never true  Transportation Needs: No Transportation Needs   Lack of Transportation (Medical): No   Lack of Transportation (Non-Medical): No  Physical Activity: Sufficiently Active   Days of Exercise per Week: 5 days   Minutes of Exercise per Session: 30 min  Stress: No Stress Concern Present   Feeling of Stress : Not at all  Social Connections: Unknown   Frequency of Communication with Friends and Family: More than three times a week   Frequency of Social Gatherings with Friends and Family: More than three times a week   Attends Religious Services: More than 4 times per year   Active Member of [employer] or Organizations: Not on file   Attends Engineer, structural: More than 4 times per year   Marital Status: Not on file    Tobacco Counseling Counseling given: Not Answered   Clinical Intake:  Pre-visit preparation completed: Yes        Diabetes: No  How often do you need to have someone help you when you read instructions, pamphlets, or other written materials from your doctor or pharmacy?: 1 - Never  Interpreter Needed?: No      Activities of Daily Living    08/14/2021    2:00 PM  In your present state of health, do you have any difficulty performing the following activities:  Hearing? 0  Vision? 0  Difficulty concentrating or making decisions? 0  Walking or climbing stairs? 0  Dressing or bathing? 0  Doing errands, shopping? 0  Preparing Food  and eating ? N  Using the Toilet? N  In the past six months, have you accidently leaked urine? N  Do you have problems with loss of bowel control? N  Managing your Medications? N  Managing your Finances? N  Housekeeping or managing your Housekeeping? N    Patient Care Team: Dale Harrison, MD as PCP - General (Internal Medicine) Earna Coder,  MD as Consulting Physician (Hematology and Oncology)  Indicate any recent Medical Services you may have received from other than [facility] providers in the past year (date may be approximate).     Assessment:   This is a routine wellness examination for Robin Hill.  Virtual Visit via Telephone Note  I connected with  Robin Hill on 08/14/21 at  1:45 PM EDT by telephone and verified that I am speaking with the correct person using two identifiers.  Persons participating in the virtual visit: patient/Nurse Health Advisor   I discussed the limitations of performing an evaluation and management service by telehealth. The patient expressed understanding and agreed to proceed. We continued and completed visit with audio only. Some vital signs may be absent or patient reported.   Hearing/Vision screen Hearing Screening - Comments:: Patient is able to hear conversational tones without difficulty.  No issues reported. Vision Screening - Comments:: Followed by [facility] Wears corrective lenses  They have regular follow up with the ophthalmologist  Dietary issues and exercise activities discussed: Current Exercise Habits: Home exercise routine, Type of exercise: walking (Water aerobics), Time (Minutes): 60, Frequency (Times/Week): 4, Weekly Exercise (Minutes/Week): 240, Intensity: Mild   Goals Addressed               This Visit's Progress     Patient Stated     Weight (lb) < 200 lb (90.7 kg) (pt-stated)   218 lb (98.9 kg)     Stay active Healthy diet       Depression Screen    08/14/2021    1:58 PM 05/13/2020    1:06  PM 11/08/2019    2:03 PM 06/08/2019    1:54 PM 06/05/2018    2:11 PM 06/02/2017    4:12 PM 05/28/2016   11:22 AM  PHQ 2/9 Scores  PHQ - 2 Score 0 0 0 0 0 0 0    Fall Risk    08/14/2021    2:00 PM 09/11/2020    3:15 PM 05/13/2020    1:08 PM 10/26/2019   11:48 AM 06/08/2019    1:54 PM  Fall Risk   Falls in the past year? 0 0 1 0 0  Number falls in past yr: 0  0 0   Injury with Fall?    0   Follow up Falls evaluation completed Falls evaluation completed Falls evaluation completed Falls evaluation completed Falls evaluation completed    FALL RISK PREVENTION PERTAINING TO THE HOME: Home free of loose throw rugs in walkways, pet beds, electrical cords, etc? Yes  Adequate lighting in your home to reduce risk of falls? Yes   ASSISTIVE DEVICES UTILIZED TO PREVENT FALLS: Life alert? No  Use of a cane, walker or w/c? No   TIMED UP AND GO: Was the test performed? No .   Cognitive Function:    06/05/2018    2:24 PM 06/02/2017    4:30 PM  MMSE - Mini Mental State Exam  Orientation to time 5 5  Orientation to Place 5 5  Registration 3 3  Attention/ Calculation 4 5  Recall 2 3  Language- name 2 objects 2 2  Language- repeat 1 1  Language- follow 3 step command 3 3  Language- read & follow direction 1 1  Write a sentence 1 1  Copy design 1 1  Total score 28 30        05/28/2016   11:22 AM  6CIT Screen  What Year? 0 points  What month? 0 points  What time? 0 points  Count back from 20 0 points  Months in reverse 0 points  Repeat phrase 0 points  Total Score 0 points    Immunizations Immunization History  Administered Date(s) Administered   Influenza Split 02/22/2014   Influenza Whole 02/17/2017   Influenza, High Dose Seasonal PF 01/10/2018, 12/29/2018   Influenza-Unspecified 02/24/2015, 12/09/2015, 01/02/2020   PFIZER(Purple Top)SARS-COV-2 Vaccination 06/26/2019, 07/17/2019, 02/21/2020, 09/26/2020   Pneumococcal Conjugate-13 06/05/2016   Pneumococcal Polysaccharide-23  02/24/2015   Tdap 12/09/2015   Zoster Recombinat (Shingrix) 08/13/2016, 12/29/2018   Pneumococcal vaccine status: Due, Education has been provided regarding the importance of this vaccine. Advised may receive this vaccine at local pharmacy or Health Dept. Aware to provide a copy of the vaccination record if obtained from local pharmacy or Health Dept. Verbalized acceptance and understanding.  Screening Tests Health Maintenance  Topic Date Due   COVID-19 Vaccine (5 - Booster for Pfizer series) 08/30/2021 (Originally 11/21/2020)   Pneumonia Vaccine 5+ Years old (3) 12/22/2021 (Originally 02/24/2020)   INFLUENZA VACCINE  12/01/2021   MAMMOGRAM  12/17/2021   COLONOSCOPY (Pts 45-51yrs Insurance coverage will need to be confirmed)  10/02/2025   TETANUS/TDAP  12/08/2025   DEXA SCAN  Completed   Hepatitis C Screening  Completed   Zoster Vaccines- Shingrix  Completed   HPV VACCINES  Aged Out   Health Maintenance There are no preventive care reminders to display for this patient.  Lung Cancer Screening: (Low Dose CT Chest recommended if Age 68-80 years, 30 pack-year currently smoking OR have quit w/in 15years.) does not qualify.   Vision Screening: Recommended annual ophthalmology exams for early detection of glaucoma and other disorders of the eye.  Dental Screening: Recommended annual dental exams for proper oral hygiene  Community Resource Referral / Chronic Care Management: CRR required this visit?  No   CCM required this visit?  No      Plan:   Keep all routine maintenance appointments.   I have personally reviewed and noted the following in the patient's chart:   Medical and social history Use of alcohol, tobacco or illicit drugs  Current medications and supplements including opioid prescriptions.  Functional ability and status Nutritional status Physical activity Advanced directives List of other physicians Hospitalizations, surgeries, and ER visits in previous 12  months Vitals Screenings to include cognitive, depression, and falls Referrals and appointments  In addition, I have reviewed and discussed with patient certain preventive protocols, quality metrics, and best practice recommendations. A written personalized care plan for preventive services as well as general preventive health recommendations were provided to patient.     Ashok Pall, LPN   1/61/0960

## 2021-08-14 NOTE — Patient Instructions (Signed)
?  Robin Hill , ?Thank you for taking time to come for your Medicare Wellness Visit. I appreciate your ongoing commitment to your health goals. Please review the following plan we discussed and let me know if I can assist you in the future.  ? ?These are the goals we discussed: ? Goals   ? ?  ? Patient Stated  ?   Weight (lb) < 200 lb (90.7 kg) (pt-stated)   ?   Stay active ?Healthy diet ?  ? ?  ?  ?This is a list of the screening recommended for you and due dates:  ?Health Maintenance  ?Topic Date Due  ? COVID-19 Vaccine (5 - Booster for Pfizer series) 08/30/2021*  ? Pneumonia Vaccine (3) 12/22/2021*  ? Flu Shot  12/01/2021  ? Mammogram  12/17/2021  ? Colon Cancer Screening  10/02/2025  ? Tetanus Vaccine  12/08/2025  ? DEXA scan (bone density measurement)  Completed  ? Hepatitis C Screening: USPSTF Recommendation to screen - Ages 68-79 yo.  Completed  ? Zoster (Shingles) Vaccine  Completed  ? HPV Vaccine  Aged Out  ?*Topic was postponed. The date shown is not the original due date.  ?  ?

## 2021-08-14 NOTE — Telephone Encounter (Signed)
Unable to reach patient for scheduled AWV. No answer when called. Left message to reschedule.  

## 2021-08-19 DIAGNOSIS — C50912 Malignant neoplasm of unspecified site of left female breast: Secondary | ICD-10-CM | POA: Diagnosis not present

## 2021-09-17 ENCOUNTER — Other Ambulatory Visit: Payer: Self-pay | Admitting: Internal Medicine

## 2021-09-17 DIAGNOSIS — G629 Polyneuropathy, unspecified: Secondary | ICD-10-CM

## 2021-10-14 DIAGNOSIS — C50912 Malignant neoplasm of unspecified site of left female breast: Secondary | ICD-10-CM | POA: Diagnosis not present

## 2021-10-21 ENCOUNTER — Other Ambulatory Visit: Payer: Self-pay | Admitting: Internal Medicine

## 2021-10-22 ENCOUNTER — Other Ambulatory Visit: Payer: Self-pay | Admitting: Internal Medicine

## 2021-10-22 DIAGNOSIS — Z1231 Encounter for screening mammogram for malignant neoplasm of breast: Secondary | ICD-10-CM

## 2021-11-13 ENCOUNTER — Telehealth: Payer: Self-pay | Admitting: Internal Medicine

## 2021-11-13 NOTE — Telephone Encounter (Signed)
Patient called and stated she thought she was discharged form your clinic. She has an appointment to follow up with her PCP after her mammogram and wants to know if she needs to see you. Please advise.

## 2021-11-16 NOTE — Telephone Encounter (Signed)
Detailed message left on pts vm.

## 2021-12-09 DIAGNOSIS — H2513 Age-related nuclear cataract, bilateral: Secondary | ICD-10-CM | POA: Diagnosis not present

## 2021-12-18 ENCOUNTER — Ambulatory Visit
Admission: RE | Admit: 2021-12-18 | Discharge: 2021-12-18 | Disposition: A | Discharge status: Home / Self Care | Payer: Medicare Other | Source: Ambulatory Visit | Attending: Internal Medicine | Admitting: Internal Medicine

## 2021-12-18 DIAGNOSIS — Z1231 Encounter for screening mammogram for malignant neoplasm of breast: Secondary | ICD-10-CM | POA: Diagnosis not present

## 2021-12-21 ENCOUNTER — Telehealth: Payer: Self-pay | Admitting: Internal Medicine

## 2021-12-21 NOTE — Telephone Encounter (Signed)
Opened in error

## 2021-12-24 ENCOUNTER — Encounter: Payer: Self-pay | Admitting: Internal Medicine

## 2021-12-24 ENCOUNTER — Ambulatory Visit (INDEPENDENT_AMBULATORY_CARE_PROVIDER_SITE_OTHER): Payer: Medicare Other | Admitting: Internal Medicine

## 2021-12-24 VITALS — BP 126/80 | HR 79 | Temp 98.4°F | Resp 16 | Ht 64.0 in | Wt 207.8 lb

## 2021-12-24 DIAGNOSIS — G620 Drug-induced polyneuropathy: Secondary | ICD-10-CM | POA: Diagnosis not present

## 2021-12-24 DIAGNOSIS — R739 Hyperglycemia, unspecified: Secondary | ICD-10-CM

## 2021-12-24 DIAGNOSIS — Z853 Personal history of malignant neoplasm of breast: Secondary | ICD-10-CM | POA: Diagnosis not present

## 2021-12-24 DIAGNOSIS — J4521 Mild intermittent asthma with (acute) exacerbation: Secondary | ICD-10-CM

## 2021-12-24 DIAGNOSIS — Z0000 Encounter for general adult medical examination without abnormal findings: Secondary | ICD-10-CM | POA: Diagnosis not present

## 2021-12-24 DIAGNOSIS — E785 Hyperlipidemia, unspecified: Secondary | ICD-10-CM | POA: Diagnosis not present

## 2021-12-24 DIAGNOSIS — T451X5A Adverse effect of antineoplastic and immunosuppressive drugs, initial encounter: Secondary | ICD-10-CM | POA: Diagnosis not present

## 2021-12-24 DIAGNOSIS — K219 Gastro-esophageal reflux disease without esophagitis: Secondary | ICD-10-CM | POA: Diagnosis not present

## 2021-12-24 MED ORDER — ROSUVASTATIN CALCIUM 40 MG PO TABS: 40.0000 mg | ORAL_TABLET | Freq: Every day | ORAL | 3 refills | Status: DC

## 2021-12-24 NOTE — Assessment & Plan Note (Addendum)
Physical today 12/24/21.  PAP 08/2017.  Mammogram 12/18/21 - Birads I. Colonoscopy 2017 - recommended f/u in 10 years.

## 2021-12-24 NOTE — Progress Notes (Signed)
Patient ID: [name], female   DOB: 10-03-1950, 71 y.o.   MRN: [id number]   Subjective:    Patient ID: [name], female    DOB: [birth year], 71 y.o.   MRN: [id number]   Patient here for her physical exam.   Chief Complaint  Patient presents with   Follow-up    Yearly CPE   .   HPI Reports she is doing relatively well.  Followed by cancer center.  Off aromatase inhibitor now.  Just completed 10 years.  Hgb stable 11.8.  saw ortho - s/p injection - trigger finger left thumb.  Still some "arthritis" left hand.  Tries to stay active.  No chest pain.  Breathing stable.  No increased cough or congestion.  No acid reflux.  No abdominal pain or bowel change reported.     Past Medical History:  Diagnosis Date   Allergic rhinitis    Allergy    Asthma    Breast cancer ([place]) 2012   mastectomy with chemo and rad tx   Diverticulosis    Dysmenorrhea    Hypercholesterolemia    Past Surgical History:  Procedure Laterality Date   BREAST BIOPSY Right 2012   benign   BREAST SURGERY  2011   breast cancer   CESAREAN SECTION  [place]   COLONOSCOPY WITH PROPOFOL N/A 10/03/2015   Procedure: COLONOSCOPY WITH PROPOFOL;  Surgeon: Manya Silvas, MD;  Location: [facility];  Service: Endoscopy;  Laterality: N/A;   MASTECTOMY Left 2012   with chemo and rad tx   NASAL SINUS SURGERY  07/2001   Family History  Problem Relation Age of Onset   Cirrhosis Brother        liver   Breast cancer Paternal Aunt        2 aunts   Social History   Socioeconomic History   Marital status: Divorced    Spouse name: Not on file   Number of children: Not on file   Years of education: Not on file   Highest education level: Not on file  Occupational History   Not on file  Tobacco Use   Smoking status: Former    Types: Cigarettes   Smokeless tobacco: Never  Vaping Use   Vaping Use: Never used  Substance and Sexual Activity   Alcohol use: Yes     Alcohol/week: 1.0 standard drink of alcohol    Types: 1 Glasses of wine per week    Comment: OCC   Drug use: No   Sexual activity: Never  Other Topics Concern   Not on file  Social History Narrative   Not on file   Social Determinants of Health   Financial Resource Strain: Low Risk  (08/14/2021)   Overall Financial Resource Strain (CARDIA)    Difficulty of Paying Living Expenses: Not hard at all  Food Insecurity: No Food Insecurity (08/14/2021)   Hunger Vital Sign    Worried About Running Out of Food in the Last Year: Never true    Ran Out of Food in the Last Year: Never true  Transportation Needs: No Transportation Needs (08/14/2021)   PRAPARE - Hydrologist (Medical): No    Lack of Transportation (Non-Medical): No  Physical Activity: Sufficiently Active (08/14/2021)   Exercise Vital Sign    Days of Exercise per Week: 5 days    Minutes of Exercise per Session: 30 min  Stress: No  Stress Concern Present (08/14/2021)   Council    Feeling of Stress : Not at all  Social Connections: Unknown (08/14/2021)   Social Connection and Isolation Panel [NHANES]    Frequency of Communication with Friends and Family: More than three times a week    Frequency of Social Gatherings with Friends and Family: More than three times a week    Attends Religious Services: More than 4 times per year    Active Member of [employer] or Organizations: Not on file    Attends Music therapist: More than 4 times per year    Marital Status: Not on file     Review of Systems  Constitutional:  Negative for appetite change and unexpected weight change.  HENT:  Negative for congestion, sinus pressure and sore throat.   Eyes:  Negative for pain and visual disturbance.  Respiratory:  Negative for cough, chest tightness and shortness of breath.   Cardiovascular:  Negative for chest pain, palpitations and leg  swelling.  Gastrointestinal:  Negative for abdominal pain, diarrhea, nausea and vomiting.  Genitourinary:  Negative for difficulty urinating and dysuria.  Musculoskeletal:  Negative for joint swelling and myalgias.       Left hand - "arthritis".   Skin:  Negative for color change and rash.  Neurological:  Negative for dizziness, light-headedness and headaches.  Hematological:  Negative for adenopathy. Does not bruise/bleed easily.  Psychiatric/Behavioral:  Negative for agitation and dysphoric mood.        Objective:     BP 126/80 (BP Location: Left Arm, Patient Position: Sitting, Cuff Size: Large)   Pulse 79   Temp 98.4 F (36.9 C) (Temporal)   Resp 16   Ht $R'5\' 4"'zz$  (1.626 m)   Wt 207 lb 12.8 oz (94.3 kg)   SpO2 98%   BMI 35.67 kg/m  Wt Readings from Last 3 Encounters:  12/24/21 207 lb 12.8 oz (94.3 kg)  08/14/21 218 lb (98.9 kg)  12/30/20 218 lb 9.6 oz (99.2 kg)    Physical Exam Vitals reviewed.  Constitutional:      General: She is not in acute distress.    Appearance: Normal appearance.  HENT:     Head: Normocephalic and atraumatic.     Right Ear: External ear normal.     Left Ear: External ear normal.  Eyes:     General: No scleral icterus.       Right eye: No discharge.        Left eye: No discharge.     Conjunctiva/sclera: Conjunctivae normal.  Neck:     Thyroid: No thyromegaly.  Cardiovascular:     Rate and Rhythm: Normal rate and regular rhythm.  Pulmonary:     Effort: No respiratory distress.     Breath sounds: Normal breath sounds. No wheezing.     Comments: Breast:  s/p left breast mastectomy.  Well healed incision sites.  No axillary adenopathy appreciated.  Abdominal:     General: Bowel sounds are normal.     Palpations: Abdomen is soft.     Tenderness: There is no abdominal tenderness.  Musculoskeletal:        General: No swelling or tenderness.     Cervical back: Neck supple. No tenderness.  Lymphadenopathy:     Cervical: No cervical  adenopathy.  Skin:    Findings: No erythema or rash.  Neurological:     Mental Status: She is alert.  Psychiatric:  Mood and Affect: Mood normal.        Behavior: Behavior normal.      Outpatient Encounter Medications as of 12/24/2021  Medication Sig   BIOTIN 5000 PO Take 500 mg by mouth daily.   budesonide-formoterol (SYMBICORT) 160-4.5 MCG/ACT inhaler USE 2 INHALATIONS BY MOUTH  TWICE DAILY   CALCIUM CITRATE PO Take by mouth.   cholecalciferol (VITAMIN D) 400 UNITS TABS Take 400 Units by mouth daily. Chewable   Omega-3 Fatty Acids (FISH OIL PO) Take 1 capsule by mouth daily.   omeprazole (PRILOSEC) 10 MG capsule TAKE 1 CAPSULE BY MOUTH  DAILY   pregabalin (LYRICA) 100 MG capsule TAKE 1 CAPSULE BY MOUTH  TWICE DAILY   vitamin B-12 (CYANOCOBALAMIN) 1000 MCG tablet Take 1,000 mcg by mouth daily.   vitamin E 400 UNIT capsule Take 400 Units by mouth daily.   [DISCONTINUED] rosuvastatin (CRESTOR) 40 MG tablet TAKE 1 TABLET BY MOUTH  DAILY   rosuvastatin (CRESTOR) 40 MG tablet Take 1 tablet (40 mg total) by mouth daily.   [DISCONTINUED] anastrozole (ARIMIDEX) 1 MG tablet TAKE 1 TABLET BY MOUTH  DAILY (Patient not taking: Reported on 12/24/2021)   [DISCONTINUED] PREVNAR 20 0.5 ML injection    [DISCONTINUED] rosuvastatin (CRESTOR) 40 MG tablet Take 1 tablet (40 mg total) by mouth daily.   No facility-administered encounter medications on file as of 12/24/2021.     Lab Results  Component Value Date   WBC 6.3 12/24/2021   HGB 12.7 12/24/2021   HCT 37.7 12/24/2021   PLT 165.0 12/24/2021   GLUCOSE 91 12/24/2021   CHOL 179 12/24/2021   TRIG 110.0 12/24/2021   HDL 69.80 12/24/2021   LDLDIRECT 126.2 05/09/2013   LDLCALC 88 12/24/2021   ALT 20 12/24/2021   AST 24 12/24/2021   NA 141 12/24/2021   K 3.8 12/24/2021   CL 103 12/24/2021   CREATININE 0.87 12/24/2021   BUN 15 12/24/2021   CO2 31 12/24/2021   TSH 1.95 12/24/2021   HGBA1C 6.3 12/24/2021   MICROALBUR <0.7 09/09/2014     MM 3D SCREEN BREAST UNI RIGHT  Result Date: 12/21/2021 CLINICAL DATA:  Screening. EXAM: DIGITAL SCREENING UNILATERAL RIGHT MAMMOGRAM WITH CAD AND TOMOSYNTHESIS TECHNIQUE: Right screening digital craniocaudal and mediolateral oblique mammograms were obtained. Right screening digital breast tomosynthesis was performed. The images were evaluated with computer-aided detection. COMPARISON:  Previous exam(s). ACR Breast Density Category b: There are scattered areas of fibroglandular density. FINDINGS: There are no findings suspicious for malignancy. IMPRESSION: No mammographic evidence of malignancy. A result letter of this screening mammogram will be mailed directly to the patient. RECOMMENDATION: Screening mammogram in one year. (Code:SM-B-01Y) BI-RADS CATEGORY  1: Negative. Electronically Signed   By: Amie Portland M.D.   On: 12/21/2021 09:49       Assessment & Plan:   Problem List Items Addressed This Visit     Asthma    Continue singulair and symbicort.  Breathing stable.  Follow.       Chemotherapy-induced neuropathy (HCC)    Continue lyrica.        GERD (gastroesophageal reflux disease)    Controlled on omeprazole.       Health care maintenance    Physical today 12/24/21.  PAP 08/2017.  Mammogram 12/18/21 - Birads I. Colonoscopy 2017 - recommended f/u in 10 years.        History of breast cancer    Completed 10 years of aromatase inhibitor.  Mammogram 12/18/21 - Birads I.  HLD (hyperlipidemia)    Continue crestor.  Low cholesterol diet and exercise.  Follow lipid panel and liver function tests.   Lab Results  Component Value Date   CHOL 179 12/24/2021   HDL 69.80 12/24/2021   LDLCALC 88 12/24/2021   LDLDIRECT 126.2 05/09/2013   TRIG 110.0 12/24/2021   CHOLHDL 3 12/24/2021       Relevant Medications   rosuvastatin (CRESTOR) 40 MG tablet   Other Relevant Orders   CBC w/Diff (Completed)   Lipid Profile (Completed)   Hepatic function panel (Completed)   TSH  (Completed)   Hyperglycemia    Low carb diet and exercise.  Follow met b and a1c.       Relevant Orders   Basic Metabolic Panel (BMET) (Completed)   HgB A1c (Completed)   Other Visit Diagnoses     Routine general medical examination at a health care facility    -  Primary        Einar Pheasant, MD

## 2021-12-25 LAB — HEPATIC FUNCTION PANEL
ALT: 20 U/L (ref 0–35)
AST: 24 U/L (ref 0–37)
Albumin: 4.4 g/dL (ref 3.5–5.2)
Alkaline Phosphatase: 69 U/L (ref 39–117)
Bilirubin, Direct: 0.1 mg/dL (ref 0.0–0.3)
Total Bilirubin: 0.5 mg/dL (ref 0.2–1.2)
Total Protein: 7.1 g/dL (ref 6.0–8.3)

## 2021-12-25 LAB — LIPID PANEL
Cholesterol: 179 mg/dL (ref 0–200)
HDL: 69.8 mg/dL (ref >39.00)
LDL Cholesterol: 88 mg/dL (ref 0–99)
NonHDL: 109.63
Total CHOL/HDL Ratio: 3
Triglycerides: 110 mg/dL (ref 0.0–149.0)
VLDL: 22 mg/dL (ref 0.0–40.0)

## 2021-12-25 LAB — CBC WITH DIFFERENTIAL/PLATELET
Basophils Absolute: 0 10*3/uL (ref 0.0–0.1)
Basophils Relative: 0.7 % (ref 0.0–3.0)
Eosinophils Absolute: 0.5 10*3/uL (ref 0.0–0.7)
Eosinophils Relative: 8.3 % — ABNORMAL HIGH (ref 0.0–5.0)
HCT: 37.7 % (ref 36.0–46.0)
Hemoglobin: 12.7 g/dL (ref 12.0–15.0)
Lymphocytes Relative: 34.2 % (ref 12.0–46.0)
Lymphs Abs: 2.1 10*3/uL (ref 0.7–4.0)
MCHC: 33.7 g/dL (ref 30.0–36.0)
MCV: 90.4 fl (ref 78.0–100.0)
Monocytes Absolute: 0.4 10*3/uL (ref 0.1–1.0)
Monocytes Relative: 6.5 % (ref 3.0–12.0)
Neutro Abs: 3.1 10*3/uL (ref 1.4–7.7)
Neutrophils Relative %: 50.3 % (ref 43.0–77.0)
Platelets: 165 10*3/uL (ref 150.0–400.0)
RBC: 4.17 Mil/uL (ref 3.87–5.11)
RDW: 13.1 % (ref 11.5–15.5)
WBC: 6.3 10*3/uL (ref 4.0–10.5)

## 2021-12-25 LAB — BASIC METABOLIC PANEL
BUN: 15 mg/dL (ref 6–23)
CO2: 31 mEq/L (ref 19–32)
Calcium: 9.8 mg/dL (ref 8.4–10.5)
Chloride: 103 mEq/L (ref 96–112)
Creatinine, Ser: 0.87 mg/dL (ref 0.40–1.20)
GFR: 66.84 mL/min (ref >60.00)
Glucose, Bld: 91 mg/dL (ref 70–99)
Potassium: 3.8 mEq/L (ref 3.5–5.1)
Sodium: 141 mEq/L (ref 135–145)

## 2021-12-25 LAB — HEMOGLOBIN A1C: Hgb A1c MFr Bld: 6.3 % (ref 4.6–6.5)

## 2021-12-25 LAB — TSH: TSH: 1.95 u[IU]/mL (ref 0.35–5.50)

## 2021-12-26 ENCOUNTER — Encounter: Payer: Self-pay | Admitting: Internal Medicine

## 2021-12-26 NOTE — Assessment & Plan Note (Signed)
Controlled on omeprazole.   

## 2021-12-26 NOTE — Assessment & Plan Note (Signed)
Continue lyrica 

## 2021-12-26 NOTE — Assessment & Plan Note (Signed)
Continue singulair and symbicort.  Breathing stable.  Follow.  

## 2021-12-26 NOTE — Assessment & Plan Note (Signed)
Low carb diet and exercise.  Follow met b and a1c.

## 2021-12-26 NOTE — Assessment & Plan Note (Signed)
Completed 10 years of aromatase inhibitor.  Mammogram 12/18/21 - Birads I.   

## 2021-12-26 NOTE — Assessment & Plan Note (Signed)
Continue crestor.  Low cholesterol diet and exercise.  Follow lipid panel and liver function tests.   Lab Results  Component Value Date   CHOL 179 12/24/2021   HDL 69.80 12/24/2021   LDLCALC 88 12/24/2021   LDLDIRECT 126.2 05/09/2013   TRIG 110.0 12/24/2021   CHOLHDL 3 12/24/2021

## 2021-12-28 ENCOUNTER — Other Ambulatory Visit: Payer: Self-pay | Admitting: Internal Medicine

## 2021-12-28 DIAGNOSIS — G629 Polyneuropathy, unspecified: Secondary | ICD-10-CM

## 2022-01-01 ENCOUNTER — Ambulatory Visit: Payer: Medicare Other | Admitting: Internal Medicine

## 2022-01-11 ENCOUNTER — Ambulatory Visit: Payer: Medicare Other | Admitting: Internal Medicine

## 2022-04-22 ENCOUNTER — Encounter: Payer: Self-pay | Admitting: Internal Medicine

## 2022-04-22 ENCOUNTER — Ambulatory Visit (INDEPENDENT_AMBULATORY_CARE_PROVIDER_SITE_OTHER): Payer: Medicare Other | Admitting: Internal Medicine

## 2022-04-22 VITALS — BP 132/72 | HR 83 | Temp 98.4°F | Resp 17 | Ht 64.0 in | Wt 211.8 lb

## 2022-04-22 DIAGNOSIS — Z9109 Other allergy status, other than to drugs and biological substances: Secondary | ICD-10-CM

## 2022-04-22 DIAGNOSIS — E785 Hyperlipidemia, unspecified: Secondary | ICD-10-CM | POA: Diagnosis not present

## 2022-04-22 DIAGNOSIS — Z853 Personal history of malignant neoplasm of breast: Secondary | ICD-10-CM | POA: Diagnosis not present

## 2022-04-22 DIAGNOSIS — T451X5A Adverse effect of antineoplastic and immunosuppressive drugs, initial encounter: Secondary | ICD-10-CM

## 2022-04-22 DIAGNOSIS — J4521 Mild intermittent asthma with (acute) exacerbation: Secondary | ICD-10-CM

## 2022-04-22 DIAGNOSIS — W19XXXA Unspecified fall, initial encounter: Secondary | ICD-10-CM

## 2022-04-22 DIAGNOSIS — R739 Hyperglycemia, unspecified: Secondary | ICD-10-CM

## 2022-04-22 DIAGNOSIS — K219 Gastro-esophageal reflux disease without esophagitis: Secondary | ICD-10-CM

## 2022-04-22 DIAGNOSIS — G620 Drug-induced polyneuropathy: Secondary | ICD-10-CM | POA: Diagnosis not present

## 2022-04-22 NOTE — Progress Notes (Signed)
Subjective:    Patient ID: Robin Hill, female    DOB: 10/01/1950, 71 y.o.   MRN: [id number]  Patient here for  Chief Complaint  Patient presents with   Medical Management of Chronic Issues    HPI Here for a scheduled follow up.  Here to follow up regarding asthma, hypercholesterolemia, GERD and history of breast cancer.  She reports she is doing relatively well.  Tries to stay active.  No chest pain or sob reported.  No abdominal pain or bowel change reported.  Did fall in the parking lot prior to coming in for her appt.  States she fell stepping up on the curb.  No head injury.  Fell forward on hands and knees.  No increased pain reported.     Past Medical History:  Diagnosis Date   Allergic rhinitis    Allergy    Asthma    Breast cancer ([place]) 2012   mastectomy with chemo and rad tx   Diverticulosis    Dysmenorrhea    Hypercholesterolemia    Past Surgical History:  Procedure Laterality Date   BREAST BIOPSY Right 2012   benign   BREAST SURGERY  2011   breast cancer   CESAREAN SECTION  [place]   COLONOSCOPY WITH PROPOFOL N/A 10/03/2015   Procedure: COLONOSCOPY WITH PROPOFOL;  Surgeon: Manya Silvas, MD;  Location: [facility];  Service: Endoscopy;  Laterality: N/A;   MASTECTOMY Left 2012   with chemo and rad tx   NASAL SINUS SURGERY  07/2001   Family History  Problem Relation Age of Onset   Cirrhosis Brother        liver   Breast cancer Paternal Aunt        2 aunts   Social History   Socioeconomic History   Marital status: Divorced    Spouse name: Not on file   Number of children: Not on file   Years of education: Not on file   Highest education level: Not on file  Occupational History   Not on file  Tobacco Use   Smoking status: Former    Types: Cigarettes   Smokeless tobacco: Never  Vaping Use   Vaping Use: Never used  Substance and Sexual Activity   Alcohol use: Yes    Alcohol/week: 1.0  standard drink of alcohol    Types: 1 Glasses of wine per week    Comment: OCC   Drug use: No   Sexual activity: Never  Other Topics Concern   Not on file  Social History Narrative   Not on file   Social Determinants of Health   Financial Resource Strain: Low Risk  (08/14/2021)   Overall Financial Resource Strain (CARDIA)    Difficulty of Paying Living Expenses: Not hard at all  Food Insecurity: No Food Insecurity (08/14/2021)   Hunger Vital Sign    Worried About Running Out of Food in the Last Year: Never true    Ran Out of Food in the Last Year: Never true  Transportation Needs: No Transportation Needs (08/14/2021)   PRAPARE - Hydrologist (Medical): No    Lack of Transportation (Non-Medical): No  Physical Activity: Sufficiently Active (08/14/2021)   Exercise Vital Sign    Days of Exercise per Week: 5 days    Minutes of Exercise per Session: 30 min  Stress: No Stress Concern Present (08/14/2021)   [employer] - Occupational Stress Questionnaire    Feeling of Stress : Not at all  Social Connections: Unknown (08/14/2021)   Social Connection and Isolation Panel [NHANES]    Frequency of Communication with Friends and Family: More than three times a week    Frequency of Social Gatherings with Friends and Family: More than three times a week    Attends Religious Services: More than 4 times per year    Active Member of [employer] or Organizations: Not on file    Attends Music therapist: More than 4 times per year    Marital Status: Not on file     Review of Systems  Constitutional:  Negative for appetite change and unexpected weight change.  HENT:  Negative for congestion and sinus pressure.   Respiratory:  Negative for cough, chest tightness and shortness of breath.   Cardiovascular:  Negative for chest pain, palpitations and leg swelling.  Gastrointestinal:  Negative for abdominal pain, diarrhea, nausea and  vomiting.  Genitourinary:  Negative for difficulty urinating and dysuria.  Musculoskeletal:  Negative for joint swelling and myalgias.       Hands better.    Skin:  Negative for color change and rash.  Neurological:  Negative for dizziness and headaches.  Psychiatric/Behavioral:  Negative for agitation and dysphoric mood.        Objective:     BP 132/72   Pulse 83   Temp 98.4 F (36.9 C) (Temporal)   Resp 17   Ht _0  (1.626 m)   Wt 211 lb 12.8 oz (96.1 kg)   SpO2 98%   BMI 36.36 kg/m  Wt Readings from Last 3 Encounters:  04/22/22 211 lb 12.8 oz (96.1 kg)  12/24/21 207 lb 12.8 oz (94.3 kg)  08/14/21 218 lb (98.9 kg)    Physical Exam Constitutional:      General: She is not in acute distress.    Appearance: Normal appearance.  HENT:     Head: Normocephalic and atraumatic.     Right Ear: External ear normal.     Left Ear: External ear normal.     Nose: Nose normal.     Mouth/Throat:     Pharynx: Oropharynx is clear. No oropharyngeal exudate or posterior oropharyngeal erythema.  Neck:     Thyroid: No thyromegaly.  Cardiovascular:     Rate and Rhythm: Normal rate and regular rhythm.  Pulmonary:     Effort: No respiratory distress.     Breath sounds: Normal breath sounds. No wheezing.  Abdominal:     General: Bowel sounds are normal.     Palpations: Abdomen is soft.     Tenderness: There is no abdominal tenderness.  Musculoskeletal:        General: No swelling or tenderness.     Cervical back: Neck supple.  Lymphadenopathy:     Cervical: No cervical adenopathy.  Skin:    Findings: No erythema or rash.  Neurological:     Mental Status: She is alert.      Outpatient Encounter Medications as of 04/22/2022  Medication Sig   BIOTIN 5000 PO Take 500 mg by mouth daily.   budesonide-formoterol (SYMBICORT) 160-4.5 MCG/ACT inhaler USE 2 INHALATIONS BY MOUTH  TWICE DAILY   CALCIUM CITRATE PO Take by mouth.   cholecalciferol (VITAMIN D) 400 UNITS TABS Take 400  Units by mouth daily. Chewable   glucosamine-chondroitin 500-400 MG tablet Take 1 tablet by mouth 3 (three) times daily.   Multiple Vitamins-Minerals (CENTRUM SILVER 50+WOMEN)  TABS Take by mouth.   Omega-3 Fatty Acids (FISH OIL PO) Take 1 capsule by mouth daily.   omeprazole (PRILOSEC) 10 MG capsule TAKE 1 CAPSULE BY MOUTH  DAILY   pregabalin (LYRICA) 100 MG capsule TAKE 1 CAPSULE BY MOUTH TWICE  DAILY   rosuvastatin (CRESTOR) 40 MG tablet Take 1 tablet (40 mg total) by mouth daily.   vitamin B-12 (CYANOCOBALAMIN) 1000 MCG tablet Take 1,000 mcg by mouth daily.   vitamin E 400 UNIT capsule Take 400 Units by mouth daily.   Zinc 50 MG TABS Take by mouth.   No facility-administered encounter medications on file as of 04/22/2022.     Lab Results  Component Value Date   WBC 6.3 12/24/2021   HGB 12.7 12/24/2021   HCT 37.7 12/24/2021   PLT 165.0 12/24/2021   GLUCOSE 89 04/22/2022   CHOL 176 04/22/2022   TRIG 100.0 04/22/2022   HDL 76.80 04/22/2022   LDLDIRECT 126.2 05/09/2013   LDLCALC 79 04/22/2022   ALT 24 04/22/2022   AST 26 04/22/2022   NA 140 04/22/2022   K 3.8 04/22/2022   CL 101 04/22/2022   CREATININE 0.81 04/22/2022   BUN 7 04/22/2022   CO2 32 04/22/2022   TSH 1.95 12/24/2021   HGBA1C 6.3 04/22/2022   MICROALBUR <0.7 09/09/2014    MM 3D SCREEN BREAST UNI RIGHT  Result Date: 12/21/2021 CLINICAL DATA:  Screening. EXAM: DIGITAL SCREENING UNILATERAL RIGHT MAMMOGRAM WITH CAD AND TOMOSYNTHESIS TECHNIQUE: Right screening digital craniocaudal and mediolateral oblique mammograms were obtained. Right screening digital breast tomosynthesis was performed. The images were evaluated with computer-aided detection. COMPARISON:  Previous exam(s). ACR Breast Density Category b: There are scattered areas of fibroglandular density. FINDINGS: There are no findings suspicious for malignancy. IMPRESSION: No mammographic evidence of malignancy. A result letter of this screening mammogram will be  mailed directly to the patient. RECOMMENDATION: Screening mammogram in one year. (Code:SM-B-01Y) BI-RADS CATEGORY  1: Negative. Electronically Signed   By: Lajean Manes M.D.   On: 12/21/2021 09:49       Assessment & Plan:  Hyperglycemia Assessment & Plan: Low carb diet and exercise.  Follow met b and a1c.   Orders: -     Hemoglobin A1c  Hyperlipidemia, unspecified hyperlipidemia type Assessment & Plan: Continue crestor.  Low cholesterol diet and exercise.  Follow lipid panel and liver function tests.   Lab Results  Component Value Date   CHOL 176 04/22/2022   HDL 76.80 04/22/2022   LDLCALC 79 04/22/2022   LDLDIRECT 126.2 05/09/2013   TRIG 100.0 04/22/2022   CHOLHDL 2 04/22/2022    Orders: -     Basic metabolic panel -     Lipid panel -     Hepatic function panel  Mild intermittent asthma with acute exacerbation Assessment & Plan: Continue singulair and symbicort.  Breathing stable.  Follow.    Chemotherapy-induced neuropathy ([place]) Assessment & Plan: Continue lyrica.     Environmental allergies Assessment & Plan: Continue singulair.  Stable.    Gastroesophageal reflux disease without esophagitis Assessment & Plan: Controlled on omeprazole.    History of breast cancer Assessment & Plan: Completed 10 years of aromatase inhibitor.  Mammogram 12/18/21 - Birads I.     Fall, initial encounter Assessment & Plan: S/p fall - occurred in parking lot prior to coming in the  office.  No head injury.  Fell forward.  No increased pain.  Follow.  Call with update.       Einar Pheasant,  MD 

## 2022-04-23 ENCOUNTER — Ambulatory Visit: Payer: Medicare Other | Admitting: Internal Medicine

## 2022-04-23 LAB — HEPATIC FUNCTION PANEL
ALT: 24 U/L (ref 0–35)
AST: 26 U/L (ref 0–37)
Albumin: 4.4 g/dL (ref 3.5–5.2)
Alkaline Phosphatase: 69 U/L (ref 39–117)
Bilirubin, Direct: 0.1 mg/dL (ref 0.0–0.3)
Total Bilirubin: 0.6 mg/dL (ref 0.2–1.2)
Total Protein: 7.3 g/dL (ref 6.0–8.3)

## 2022-04-23 LAB — LIPID PANEL
Cholesterol: 176 mg/dL (ref 0–200)
HDL: 76.8 mg/dL (ref >39.00)
LDL Cholesterol: 79 mg/dL (ref 0–99)
NonHDL: 99.11
Total CHOL/HDL Ratio: 2
Triglycerides: 100 mg/dL (ref 0.0–149.0)
VLDL: 20 mg/dL (ref 0.0–40.0)

## 2022-04-23 LAB — HEMOGLOBIN A1C: Hgb A1c MFr Bld: 6.3 % (ref 4.6–6.5)

## 2022-04-23 LAB — BASIC METABOLIC PANEL
BUN: 7 mg/dL (ref 6–23)
CO2: 32 mEq/L (ref 19–32)
Calcium: 9.8 mg/dL (ref 8.4–10.5)
Chloride: 101 mEq/L (ref 96–112)
Creatinine, Ser: 0.81 mg/dL (ref 0.40–1.20)
GFR: 72.66 mL/min (ref >60.00)
Glucose, Bld: 89 mg/dL (ref 70–99)
Potassium: 3.8 mEq/L (ref 3.5–5.1)
Sodium: 140 mEq/L (ref 135–145)

## 2022-04-27 ENCOUNTER — Ambulatory Visit: Payer: Medicare Other | Admitting: Internal Medicine

## 2022-05-03 ENCOUNTER — Encounter: Payer: Self-pay | Admitting: Internal Medicine

## 2022-05-03 DIAGNOSIS — W19XXXA Unspecified fall, initial encounter: Secondary | ICD-10-CM | POA: Insufficient documentation

## 2022-05-03 NOTE — Assessment & Plan Note (Signed)
Continue crestor.  Low cholesterol diet and exercise.  Follow lipid panel and liver function tests.   Lab Results  Component Value Date   CHOL 176 04/22/2022   HDL 76.80 04/22/2022   LDLCALC 79 04/22/2022   LDLDIRECT 126.2 05/09/2013   TRIG 100.0 04/22/2022   CHOLHDL 2 04/22/2022

## 2022-05-03 NOTE — Assessment & Plan Note (Signed)
Completed 10 years of aromatase inhibitor.  Mammogram 12/18/21 - Birads I.

## 2022-05-03 NOTE — Assessment & Plan Note (Signed)
Continue singulair and symbicort.  Breathing stable.  Follow.

## 2022-05-03 NOTE — Assessment & Plan Note (Signed)
Continue singulair.  Stable.

## 2022-05-03 NOTE — Assessment & Plan Note (Signed)
Continue lyrica 

## 2022-05-03 NOTE — Assessment & Plan Note (Signed)
Low carb diet and exercise.  Follow met b and a1c.  

## 2022-05-03 NOTE — Assessment & Plan Note (Signed)
S/p fall - occurred in parking lot prior to coming in the  office.  No head injury.  Fell forward.  No increased pain.  Follow.  Call with update.

## 2022-05-03 NOTE — Assessment & Plan Note (Signed)
Controlled on omeprazole.   

## 2022-06-17 ENCOUNTER — Ambulatory Visit (INDEPENDENT_AMBULATORY_CARE_PROVIDER_SITE_OTHER): Payer: 59 | Admitting: Nurse Practitioner

## 2022-06-17 ENCOUNTER — Encounter: Payer: Self-pay | Admitting: Nurse Practitioner

## 2022-06-17 ENCOUNTER — Ambulatory Visit
Admission: RE | Admit: 2022-06-17 | Discharge: 2022-06-17 | Disposition: A | Discharge status: Home / Self Care | Payer: 59 | Attending: Nurse Practitioner | Admitting: Nurse Practitioner

## 2022-06-17 ENCOUNTER — Ambulatory Visit
Admission: RE | Admit: 2022-06-17 | Discharge: 2022-06-17 | Disposition: A | Discharge status: Home / Self Care | Payer: 59 | Source: Ambulatory Visit | Attending: Nurse Practitioner | Admitting: Nurse Practitioner

## 2022-06-17 VITALS — BP 126/70 | HR 81 | Temp 97.8°F | Ht 64.0 in | Wt 208.0 lb

## 2022-06-17 DIAGNOSIS — M25512 Pain in left shoulder: Secondary | ICD-10-CM | POA: Insufficient documentation

## 2022-06-17 NOTE — Progress Notes (Signed)
Robin Morrow, NP-C Phone: [phone]  Robin Hill is a 72 y.o. female who presents today for left shoulder pain.  Patient reports waking up Monday morning with left shoulder pain. She believed she had slept on it wrong. She did go to the gym that morning and do her arm exercises. She reports she has continued to have shoulder pain ever since. It has been stable, not improving or worsening. She has been using OTC pain patches that are providing short term relief and heat which has also helped. She reports pain with movements and the joint is tender to touch. Denies radiation down arm or up into neck.   Social History   Tobacco Use  Smoking Status Former   Types: Cigarettes  Smokeless Tobacco Never    Current Outpatient Medications on File Prior to Visit  Medication Sig Dispense Refill   BIOTIN 5000 PO Take 500 mg by mouth daily.     budesonide-formoterol (SYMBICORT) 160-4.5 MCG/ACT inhaler USE 2 INHALATIONS BY MOUTH  TWICE DAILY 30.6 g 3   CALCIUM CITRATE PO Take by mouth.     cholecalciferol (VITAMIN D) 400 UNITS TABS Take 400 Units by mouth daily. Chewable     glucosamine-chondroitin 500-400 MG tablet Take 1 tablet by mouth 3 (three) times daily.     Multiple Vitamins-Minerals (CENTRUM SILVER 50+WOMEN) TABS Take by mouth.     Omega-3 Fatty Acids (FISH OIL PO) Take 1 capsule by mouth daily.     omeprazole (PRILOSEC) 10 MG capsule TAKE 1 CAPSULE BY MOUTH  DAILY 100 capsule 2   pregabalin (LYRICA) 100 MG capsule TAKE 1 CAPSULE BY MOUTH TWICE  DAILY 180 capsule 3   rosuvastatin (CRESTOR) 40 MG tablet Take 1 tablet (40 mg total) by mouth daily. 90 tablet 3   vitamin B-12 (CYANOCOBALAMIN) 1000 MCG tablet Take 1,000 mcg by mouth daily.     vitamin E 400 UNIT capsule Take 400 Units by mouth daily.     Zinc 50 MG TABS Take by mouth.     No current facility-administered medications on file prior to visit.   ROS see history of present illness  Objective  Physical Exam Vitals:    06/17/22 1421  BP: 126/70  Pulse: 81  Temp: 97.8 F (36.6 C)  SpO2: 96%    BP Readings from Last 3 Encounters:  06/17/22 126/70  04/22/22 132/72  12/24/21 126/80   Wt Readings from Last 3 Encounters:  06/17/22 208 lb (94.3 kg)  04/22/22 211 lb 12.8 oz (96.1 kg)  12/24/21 207 lb 12.8 oz (94.3 kg)    Physical Exam Constitutional:      General: She is not in acute distress.    Appearance: Normal appearance.  HENT:     Head: Normocephalic.  Cardiovascular:     Rate and Rhythm: Normal rate and regular rhythm.     Heart sounds: Normal heart sounds.  Pulmonary:     Effort: Pulmonary effort is normal.     Breath sounds: Normal breath sounds.  Musculoskeletal:     Right shoulder: Normal.     Left shoulder: Tenderness (with palpation of joint) and crepitus present. No swelling or deformity. Decreased range of motion (pain with extension and flexion). Normal strength.  Skin:    General: Skin is warm and dry.  Neurological:     General: No focal deficit present.     Mental Status: She is alert.  Psychiatric:        Mood and Affect: Mood normal.  Behavior: Behavior normal.   Assessment/Plan: Please see individual problem list.  Acute pain of left shoulder Assessment & Plan: Will obtain x-ray of left shoulder and refer to Orthopedics for further evaluation. On exam, decreased ROM noted, joint TTP, strength WNL. Encouraged patient to continue OTC treatments, she can alternate Tylenol and Ibuprofen and ice/heat the painful area.   Orders: -     DG Shoulder Left; Future -     Ambulatory referral to Orthopedic Surgery    Return if symptoms worsen or fail to improve.   Robin Morrow, NP-C [place]

## 2022-06-17 NOTE — Assessment & Plan Note (Addendum)
Will obtain x-ray of left shoulder and refer to Orthopedics for further evaluation. On exam, decreased ROM noted, joint TTP, strength WNL. Encouraged patient to continue OTC treatments, she can alternate Tylenol and Ibuprofen and ice/heat the painful area. Will contact her with x-ray results.

## 2022-06-21 DIAGNOSIS — M19012 Primary osteoarthritis, left shoulder: Secondary | ICD-10-CM | POA: Diagnosis not present

## 2022-07-31 ENCOUNTER — Other Ambulatory Visit: Payer: Self-pay | Admitting: Family

## 2022-08-12 ENCOUNTER — Telehealth: Payer: Self-pay | Admitting: Internal Medicine

## 2022-08-12 NOTE — Telephone Encounter (Signed)
Copied from CRM [id number]. Topic: Medicare AWV >> Aug 12, 2022  9:18 AM Rushie Goltz wrote: Reason for CRM: Called patient to schedule Medicare Annual Wellness Visit (AWV). Left message for patient to call back and schedule Medicare Annual Wellness Visit (AWV).  Last date of AWV: 08/14/2021  Please schedule an AWVS appointment at any time with [facility] VISIT.  If any questions, please contact me at [phone].    Thank you,  [facility] Support [facility] Direct dial  [phone]

## 2022-08-19 ENCOUNTER — Other Ambulatory Visit: Payer: Self-pay | Admitting: *Deleted

## 2022-08-19 ENCOUNTER — Telehealth: Payer: Self-pay | Admitting: Internal Medicine

## 2022-08-19 MED ORDER — OMEPRAZOLE 10 MG PO CPDR: 10.0000 mg | DELAYED_RELEASE_CAPSULE | Freq: Every day | ORAL | 2 refills | Status: DC

## 2022-08-19 NOTE — Telephone Encounter (Signed)
New message    1. Which medications need to be refilled? (please list name of each medication and dose if known) omeprazole (PRILOSEC) 10 MG capsule   2. Which pharmacy/location (including street and city if local pharmacy) is medication to be sent to?[facility] - [place], [place] - [address] [place]   3. Do they need a 30 day or 90 day supply? 90 day supply

## 2022-08-19 NOTE — Telephone Encounter (Signed)
Refill sent per request.

## 2022-08-19 NOTE — Telephone Encounter (Signed)
Refill sent per pt request.  

## 2022-08-24 ENCOUNTER — Ambulatory Visit (INDEPENDENT_AMBULATORY_CARE_PROVIDER_SITE_OTHER): Payer: Medicare Other | Admitting: Internal Medicine

## 2022-08-24 ENCOUNTER — Encounter: Payer: Self-pay | Admitting: Internal Medicine

## 2022-08-24 VITALS — BP 114/72 | HR 76 | Temp 98.3°F | Resp 16 | Ht 64.0 in | Wt 202.0 lb

## 2022-08-24 DIAGNOSIS — G620 Drug-induced polyneuropathy: Secondary | ICD-10-CM | POA: Diagnosis not present

## 2022-08-24 DIAGNOSIS — T451X5A Adverse effect of antineoplastic and immunosuppressive drugs, initial encounter: Secondary | ICD-10-CM

## 2022-08-24 DIAGNOSIS — E785 Hyperlipidemia, unspecified: Secondary | ICD-10-CM | POA: Diagnosis not present

## 2022-08-24 DIAGNOSIS — R739 Hyperglycemia, unspecified: Secondary | ICD-10-CM | POA: Diagnosis not present

## 2022-08-24 DIAGNOSIS — Z1231 Encounter for screening mammogram for malignant neoplasm of breast: Secondary | ICD-10-CM | POA: Diagnosis not present

## 2022-08-24 DIAGNOSIS — J4521 Mild intermittent asthma with (acute) exacerbation: Secondary | ICD-10-CM

## 2022-08-24 DIAGNOSIS — K219 Gastro-esophageal reflux disease without esophagitis: Secondary | ICD-10-CM | POA: Diagnosis not present

## 2022-08-24 DIAGNOSIS — Z9109 Other allergy status, other than to drugs and biological substances: Secondary | ICD-10-CM | POA: Diagnosis not present

## 2022-08-24 DIAGNOSIS — Z853 Personal history of malignant neoplasm of breast: Secondary | ICD-10-CM | POA: Diagnosis not present

## 2022-08-24 MED ORDER — AZELASTINE HCL 0.1 % NA SOLN: 1.0000 | Freq: Two times a day (BID) | NASAL | 12 refills | Status: AC

## 2022-08-24 NOTE — Patient Instructions (Signed)
Astelin nasal spray - 1 spray each nostril twice a day  Nasacort nasal spray - 2 sprays each nostril one time per day.  Do this in the evening.    Antihistamine daily (for example - claritin, zyrtec or allegra.

## 2022-08-24 NOTE — Progress Notes (Signed)
Subjective:    Patient ID: Robin Hill, female    DOB: [birth year], 72 y.o.   MRN: [id number]  Patient here for  Chief Complaint  Patient presents with   Medical Management of Chronic Issues    HPI Here for a scheduled follow up.  Here to follow up regarding asthma, hypercholesterolemia, GERD and history of breast cancer. Reports she is doing relatively well.  Does report noticing - in am - phlegm - throat.  Some sneezing.  No chest pain or sob reported.  No abdominal pain or bowel change reported.     Past Medical History:  Diagnosis Date   Allergic rhinitis    Allergy    Asthma    Breast cancer (HCC) 2012   mastectomy with chemo and rad tx   Diverticulosis    Dysmenorrhea    Hypercholesterolemia    Past Surgical History:  Procedure Laterality Date   BREAST BIOPSY Right 2012   benign   BREAST SURGERY  2011   breast cancer   CESAREAN SECTION  1970   CESAREAN SECTION  1973   CESAREAN SECTION  1987   COLONOSCOPY WITH PROPOFOL N/A 10/03/2015   Procedure: COLONOSCOPY WITH PROPOFOL;  Surgeon: Scot Jun, MD;  Location: [facility] END[facility]COPY;  Service: Endoscopy;  Laterality: N/A;   MASTECTOMY Left 2012   with chemo and rad tx   NASAL SINUS SURGERY  07/2001   Family History  Problem Relation Age of Onset   Cirrhosis Brother        liver   Breast cancer Paternal Aunt        2 aunts   Social History   Socioeconomic History   Marital status: Divorced    Spouse name: Not on file   Number of children: Not on file   Years of education: Not on file   Highest education level: Not on file  Occupational History   Not on file  Tobacco Use   Smoking status: Former    Types: Cigarettes   Smokeless tobacco: Never  Vaping Use   Vaping Use: Never used  Substance and Sexual Activity   Alcohol use: Yes    Alcohol/week: 1.0 standard drink of alcohol    Types: 1 Glasses of wine per week    Comment: OCC   Drug use: No   Sexual activity: Never  Other Topics Concern   Not  on file  Social History Narrative   Not on file   Social Determinants of Health   Financial Resource Strain: Low Risk  (08/14/2021)   Overall Financial Resource Strain (CARDIA)    Difficulty of Paying Living Expenses: Not hard at all  Food Insecurity: No Food Insecurity (08/14/2021)   Hunger Vital Sign    Worried About Running Out of Food in the Last Year: Never true    Ran Out of Food in the Last Year: Never true  Transportation Needs: No Transportation Needs (08/14/2021)   PRAPARE - Administrator, Civil Service (Medical): No    Lack of Transportation (Non-Medical): No  Physical Activity: Sufficiently Active (08/14/2021)   Exercise Vital Sign    Days of Exercise per Week: 5 days    Minutes of Exercise per Session: 30 min  Stress: No Stress Concern Present (08/14/2021)   [employer] of Occupational Health - Occupational Stress Questionnaire    Feeling of Stress : Not at all  Social Connections: Unknown (08/14/2021)   Social Connection and Isolation Panel [NHANES]    Frequency  of Communication with Friends and Family: More than three times a week    Frequency of Social Gatherings with Friends and Family: More than three times a week    Attends Religious Services: More than 4 times per year    Active Member of Clubs or Organizations: Not on file    Attends Engineer, structural: More than 4 times per year    Marital Status: Not on file     Review of Systems  Constitutional:  Negative for appetite change and unexpected weight change.  HENT:  Positive for congestion, postnasal drip and sneezing.   Respiratory:  Negative for cough, chest tightness and shortness of breath.   Cardiovascular:  Negative for chest pain and palpitations.  Gastrointestinal:  Negative for abdominal pain, diarrhea, nausea and vomiting.  Genitourinary:  Negative for difficulty urinating and dysuria.  Musculoskeletal:  Negative for joint swelling and myalgias.  Skin:  Negative for color  change and rash.  Neurological:  Negative for dizziness, light-headedness and headaches.  Psychiatric/Behavioral:  Negative for agitation and dysphoric mood.        Objective:     BP 114/72   Pulse 76   Temp 98.3 F (36.8 C)   Resp 16   Ht 5\' 4"  (1.626 m)   Wt 202 lb (91.6 kg)   SpO2 98%   BMI 34.67 kg/m  Wt Readings from Last 3 Encounters:  08/24/22 202 lb (91.6 kg)  06/17/22 208 lb (94.3 kg)  04/22/22 211 lb 12.8 oz (96.1 kg)    Physical Exam Vitals reviewed.  Constitutional:      General: She is not in acute distress.    Appearance: Normal appearance.  HENT:     Head: Normocephalic and atraumatic.     Right Ear: External ear normal.     Left Ear: External ear normal.  Eyes:     General: No scleral icterus.       Right eye: No discharge.        Left eye: No discharge.     Conjunctiva/sclera: Conjunctivae normal.  Neck:     Thyroid: No thyromegaly.  Cardiovascular:     Rate and Rhythm: Normal rate and regular rhythm.  Pulmonary:     Effort: No respiratory distress.     Breath sounds: Normal breath sounds. No wheezing.  Abdominal:     General: Bowel sounds are normal.     Palpations: Abdomen is soft.     Tenderness: There is no abdominal tenderness.  Musculoskeletal:        General: No swelling or tenderness.     Cervical back: Neck supple. No tenderness.  Lymphadenopathy:     Cervical: No cervical adenopathy.  Skin:    Findings: No erythema or rash.  Neurological:     Mental Status: She is alert.  Psychiatric:        Mood and Affect: Mood normal.        Behavior: Behavior normal.      Outpatient Encounter Medications as of 08/24/2022  Medication Sig   azelastine (ASTELIN) 0.1 % nasal spray Place 1 spray into both nostrils 2 (two) times daily. Use in each nostril as directed   BIOTIN 5000 PO Take 500 mg by mouth daily.   budesonide-formoterol (SYMBICORT) 160-4.5 MCG/ACT inhaler USE 2 INHALATIONS BY MOUTH  TWICE DAILY   CALCIUM CITRATE PO Take by  mouth.   cholecalciferol (VITAMIN D) 400 UNITS TABS Take 400 Units by mouth daily. Chewable   glucosamine-chondroitin 500-400 MG  tablet Take 1 tablet by mouth 3 (three) times daily.   Multiple Vitamins-Minerals (CENTRUM SILVER 50+WOMEN) TABS Take by mouth.   Omega-3 Fatty Acids (FISH OIL PO) Take 1 capsule by mouth daily.   omeprazole (PRILOSEC) 10 MG capsule Take 1 capsule (10 mg total) by mouth daily.   pregabalin (LYRICA) 100 MG capsule TAKE 1 CAPSULE BY MOUTH TWICE  DAILY   rosuvastatin (CRESTOR) 40 MG tablet Take 1 tablet (40 mg total) by mouth daily.   vitamin B-12 (CYANOCOBALAMIN) 1000 MCG tablet Take 1,000 mcg by mouth daily.   vitamin E 400 UNIT capsule Take 400 Units by mouth daily.   Zinc 50 MG TABS Take by mouth.   No facility-administered encounter medications on file as of 08/24/2022.     Lab Results  Component Value Date   WBC 6.3 12/24/2021   HGB 12.7 12/24/2021   HCT 37.7 12/24/2021   PLT 165.0 12/24/2021   GLUCOSE 109 (H) 08/26/2022   CHOL 170 08/26/2022   TRIG 92.0 08/26/2022   HDL 72.60 08/26/2022   LDLDIRECT 126.2 05/09/2013   LDLCALC 79 08/26/2022   ALT 18 08/26/2022   AST 23 08/26/2022   NA 141 08/26/2022   K 4.2 08/26/2022   CL 104 08/26/2022   CREATININE 0.88 08/26/2022   BUN 10 08/26/2022   CO2 31 08/26/2022   TSH 1.95 12/24/2021   HGBA1C 6.3 08/24/2022   MICROALBUR <0.7 09/09/2014    DG Shoulder Left  Result Date: 06/19/2022 CLINICAL DATA:  Left shoulder pain EXAM: LEFT SHOULDER - 2+ VIEW COMPARISON:  None Available. FINDINGS: No acute fracture or dislocation. No aggressive osseous lesion. Normal alignment. Generalized osteopenia. Moderate arthropathy of the acromioclavicular joint. Mild osteoarthritis of the glenohumeral joint. Soft tissue are unremarkable. No radiopaque foreign body or soft tissue emphysema. IMPRESSION: 1. No acute osseous injury of the left shoulder. Electronically Signed   By: Elige Ko M.D.   On: 06/19/2022 07:47        Assessment & Plan:  Hyperglycemia Assessment & Plan: Low carb diet and exercise.  Follow met b and a1c.   Orders: -     Hemoglobin A1c  Hyperlipidemia, unspecified hyperlipidemia type Assessment & Plan: Continue crestor.  Low cholesterol diet and exercise.  Follow lipid panel and liver function tests.   Lab Results  Component Value Date   CHOL 170 08/26/2022   HDL 72.60 08/26/2022   LDLCALC 79 08/26/2022   LDLDIRECT 126.2 05/09/2013   TRIG 92.0 08/26/2022   CHOLHDL 2 08/26/2022    Orders: -     Basic metabolic panel; Future -     Hepatic function panel; Future -     Lipid panel; Future  Visit for screening mammogram -     3D Screening Mammogram, Left and Right; Future  Mild intermittent asthma with acute exacerbation Assessment & Plan: Continue singulair and symbicort.  Breathing stable.  Treat allergy symptoms as outlined. Follow.    Chemotherapy-induced neuropathy (HCC) Assessment & Plan: Continue lyrica.     Environmental allergies Assessment & Plan: With increased sneezing and congestion as outlined.  Appears to be c/w allergies.  Continues singulair.  Add antihistamine as directed.  Steroid nasal spray and astelin as directed.  Follow.  Call with update.    Gastroesophageal reflux disease without esophagitis Assessment & Plan: Controlled on omeprazole.    History of breast cancer Assessment & Plan: Completed 10 years of aromatase inhibitor.  Mammogram 12/18/21 - Birads I.     Other orders -  Azelastine HCl; Place 1 spray into both nostrils 2 (two) times daily. Use in each nostril as directed  Dispense: 30 mL; Refill: 12     Dale Malta, MD

## 2022-08-25 LAB — HEMOGLOBIN A1C: Hgb A1c MFr Bld: 6.3 % (ref 4.6–6.5)

## 2022-08-26 ENCOUNTER — Other Ambulatory Visit (INDEPENDENT_AMBULATORY_CARE_PROVIDER_SITE_OTHER): Payer: Medicare Other

## 2022-08-26 ENCOUNTER — Telehealth: Payer: Self-pay

## 2022-08-26 DIAGNOSIS — E785 Hyperlipidemia, unspecified: Secondary | ICD-10-CM | POA: Diagnosis not present

## 2022-08-26 LAB — HEPATIC FUNCTION PANEL
ALT: 18 U/L (ref 0–35)
AST: 23 U/L (ref 0–37)
Albumin: 4.2 g/dL (ref 3.5–5.2)
Alkaline Phosphatase: 67 U/L (ref 39–117)
Bilirubin, Direct: 0.1 mg/dL (ref 0.0–0.3)
Total Bilirubin: 0.5 mg/dL (ref 0.2–1.2)
Total Protein: 7.1 g/dL (ref 6.0–8.3)

## 2022-08-26 LAB — BASIC METABOLIC PANEL
BUN: 10 mg/dL (ref 6–23)
CO2: 31 mEq/L (ref 19–32)
Calcium: 9.7 mg/dL (ref 8.4–10.5)
Chloride: 104 mEq/L (ref 96–112)
Creatinine, Ser: 0.88 mg/dL (ref 0.40–1.20)
GFR: 65.62 mL/min (ref >60.00)
Glucose, Bld: 109 mg/dL — ABNORMAL HIGH (ref 70–99)
Potassium: 4.2 mEq/L (ref 3.5–5.1)
Sodium: 141 mEq/L (ref 135–145)

## 2022-08-26 LAB — LIPID PANEL
Cholesterol: 170 mg/dL (ref 0–200)
HDL: 72.6 mg/dL (ref >39.00)
LDL Cholesterol: 79 mg/dL (ref 0–99)
NonHDL: 97.03
Total CHOL/HDL Ratio: 2
Triglycerides: 92 mg/dL (ref 0.0–149.0)
VLDL: 18.4 mg/dL (ref 0.0–40.0)

## 2022-08-26 NOTE — Telephone Encounter (Signed)
-----   Message from Dale Lorton, MD sent at 08/26/2022  4:00 AM EDT ----- Notify - overall sugar control is stable.  A1c 6.3.  it appears this is the only lab drawn.  Please see if lab drew any extra tubes. If not, pt will need to return for remainder of labs.  Sorry for inconvenience.

## 2022-08-26 NOTE — Telephone Encounter (Signed)
LMTCB. Looks like other labs have either been redrawn or added on. Will call with those results once resulted

## 2022-08-26 NOTE — Telephone Encounter (Signed)
Pt called back and I read the message to her and she verbalized understanding 

## 2022-08-31 ENCOUNTER — Encounter: Payer: Self-pay | Admitting: Internal Medicine

## 2022-08-31 NOTE — Assessment & Plan Note (Signed)
Completed 10 years of aromatase inhibitor.  Mammogram 12/18/21 - Birads I.   

## 2022-08-31 NOTE — Assessment & Plan Note (Signed)
Continue lyrica 

## 2022-08-31 NOTE — Assessment & Plan Note (Deleted)
Followed by oncology.  Continue arimidex.   

## 2022-08-31 NOTE — Assessment & Plan Note (Signed)
Controlled on omeprazole.   

## 2022-08-31 NOTE — Assessment & Plan Note (Signed)
With increased sneezing and congestion as outlined.  Appears to be c/w allergies.  Continues singulair.  Add antihistamine as directed.  Steroid nasal spray and astelin as directed.  Follow.  Call with update.

## 2022-08-31 NOTE — Assessment & Plan Note (Signed)
Low carb diet and exercise.  Follow met b and a1c.   

## 2022-08-31 NOTE — Assessment & Plan Note (Signed)
Continue singulair and symbicort.  Breathing stable.  Treat allergy symptoms as outlined. Follow.

## 2022-08-31 NOTE — Assessment & Plan Note (Signed)
Continue crestor.  Low cholesterol diet and exercise.  Follow lipid panel and liver function tests.   Lab Results  Component Value Date   CHOL 170 08/26/2022   HDL 72.60 08/26/2022   LDLCALC 79 08/26/2022   LDLDIRECT 126.2 05/09/2013   TRIG 92.0 08/26/2022   CHOLHDL 2 08/26/2022

## 2022-09-19 ENCOUNTER — Other Ambulatory Visit: Payer: Self-pay | Admitting: Internal Medicine

## 2022-09-19 DIAGNOSIS — G629 Polyneuropathy, unspecified: Secondary | ICD-10-CM

## 2022-10-23 ENCOUNTER — Other Ambulatory Visit: Payer: Self-pay | Admitting: Internal Medicine

## 2022-10-23 DIAGNOSIS — E785 Hyperlipidemia, unspecified: Secondary | ICD-10-CM

## 2022-11-09 DIAGNOSIS — C50912 Malignant neoplasm of unspecified site of left female breast: Secondary | ICD-10-CM | POA: Diagnosis not present

## 2022-12-01 ENCOUNTER — Encounter (INDEPENDENT_AMBULATORY_CARE_PROVIDER_SITE_OTHER): Payer: Self-pay

## 2022-12-17 DIAGNOSIS — H2513 Age-related nuclear cataract, bilateral: Secondary | ICD-10-CM | POA: Diagnosis not present

## 2022-12-17 DIAGNOSIS — H04123 Dry eye syndrome of bilateral lacrimal glands: Secondary | ICD-10-CM | POA: Diagnosis not present

## 2022-12-24 ENCOUNTER — Ambulatory Visit: Admission: RE | Admit: 2022-12-24 | Payer: Medicare Other | Source: Ambulatory Visit

## 2022-12-24 DIAGNOSIS — Z1231 Encounter for screening mammogram for malignant neoplasm of breast: Secondary | ICD-10-CM | POA: Diagnosis not present

## 2022-12-28 ENCOUNTER — Encounter: Payer: Self-pay | Admitting: Internal Medicine

## 2022-12-28 ENCOUNTER — Ambulatory Visit (INDEPENDENT_AMBULATORY_CARE_PROVIDER_SITE_OTHER): Payer: Medicare Other | Admitting: Internal Medicine

## 2022-12-28 VITALS — BP 132/78 | HR 78 | Temp 97.4°F | Ht 64.0 in | Wt 204.4 lb

## 2022-12-28 DIAGNOSIS — Z9109 Other allergy status, other than to drugs and biological substances: Secondary | ICD-10-CM | POA: Diagnosis not present

## 2022-12-28 DIAGNOSIS — T451X5A Adverse effect of antineoplastic and immunosuppressive drugs, initial encounter: Secondary | ICD-10-CM

## 2022-12-28 DIAGNOSIS — G620 Drug-induced polyneuropathy: Secondary | ICD-10-CM | POA: Diagnosis not present

## 2022-12-28 DIAGNOSIS — C50812 Malignant neoplasm of overlapping sites of left female breast: Secondary | ICD-10-CM | POA: Diagnosis not present

## 2022-12-28 DIAGNOSIS — E785 Hyperlipidemia, unspecified: Secondary | ICD-10-CM | POA: Diagnosis not present

## 2022-12-28 DIAGNOSIS — J4521 Mild intermittent asthma with (acute) exacerbation: Secondary | ICD-10-CM | POA: Diagnosis not present

## 2022-12-28 DIAGNOSIS — Z170 Estrogen receptor positive status [ER+]: Secondary | ICD-10-CM | POA: Diagnosis not present

## 2022-12-28 DIAGNOSIS — R739 Hyperglycemia, unspecified: Secondary | ICD-10-CM

## 2022-12-28 DIAGNOSIS — K219 Gastro-esophageal reflux disease without esophagitis: Secondary | ICD-10-CM

## 2022-12-28 DIAGNOSIS — Z0000 Encounter for general adult medical examination without abnormal findings: Secondary | ICD-10-CM | POA: Diagnosis not present

## 2022-12-28 LAB — CBC WITH DIFFERENTIAL/PLATELET
Basophils Absolute: 0.1 10*3/uL (ref 0.0–0.1)
Basophils Relative: 1.1 % (ref 0.0–3.0)
Eosinophils Absolute: 0.6 10*3/uL (ref 0.0–0.7)
Eosinophils Relative: 9.2 % — ABNORMAL HIGH (ref 0.0–5.0)
HCT: 39.7 % (ref 36.0–46.0)
Hemoglobin: 13 g/dL (ref 12.0–15.0)
Lymphocytes Relative: 31 % (ref 12.0–46.0)
Lymphs Abs: 1.9 10*3/uL (ref 0.7–4.0)
MCHC: 32.8 g/dL (ref 30.0–36.0)
MCV: 91.7 fl (ref 78.0–100.0)
Monocytes Absolute: 0.5 10*3/uL (ref 0.1–1.0)
Monocytes Relative: 8 % (ref 3.0–12.0)
Neutro Abs: 3.1 10*3/uL (ref 1.4–7.7)
Neutrophils Relative %: 50.7 % (ref 43.0–77.0)
Platelets: 191 10*3/uL (ref 150.0–400.0)
RBC: 4.33 Mil/uL (ref 3.87–5.11)
RDW: 13.2 % (ref 11.5–15.5)
WBC: 6.2 10*3/uL (ref 4.0–10.5)

## 2022-12-28 LAB — BASIC METABOLIC PANEL
BUN: 14 mg/dL (ref 6–23)
CO2: 30 mEq/L (ref 19–32)
Calcium: 10.1 mg/dL (ref 8.4–10.5)
Chloride: 102 mEq/L (ref 96–112)
Creatinine, Ser: 0.85 mg/dL (ref 0.40–1.20)
GFR: 68.25 mL/min (ref >60.00)
Glucose, Bld: 103 mg/dL — ABNORMAL HIGH (ref 70–99)
Potassium: 4.3 mEq/L (ref 3.5–5.1)
Sodium: 139 mEq/L (ref 135–145)

## 2022-12-28 LAB — HEPATIC FUNCTION PANEL
ALT: 21 U/L (ref 0–35)
AST: 26 U/L (ref 0–37)
Albumin: 4.1 g/dL (ref 3.5–5.2)
Alkaline Phosphatase: 64 U/L (ref 39–117)
Bilirubin, Direct: 0.1 mg/dL (ref 0.0–0.3)
Total Bilirubin: 0.7 mg/dL (ref 0.2–1.2)
Total Protein: 7.4 g/dL (ref 6.0–8.3)

## 2022-12-28 LAB — LIPID PANEL
Cholesterol: 177 mg/dL (ref 0–200)
HDL: 71.1 mg/dL (ref >39.00)
LDL Cholesterol: 90 mg/dL (ref 0–99)
NonHDL: 106.12
Total CHOL/HDL Ratio: 2
Triglycerides: 79 mg/dL (ref 0.0–149.0)
VLDL: 15.8 mg/dL (ref 0.0–40.0)

## 2022-12-28 LAB — TSH: TSH: 1.64 u[IU]/mL (ref 0.35–5.50)

## 2022-12-28 LAB — HEMOGLOBIN A1C: Hgb A1c MFr Bld: 6.2 % (ref 4.6–6.5)

## 2022-12-28 NOTE — Assessment & Plan Note (Signed)
Continue singulair and symbicort.  Breathing stable.  Follow.  

## 2022-12-28 NOTE — Assessment & Plan Note (Signed)
Controlled on omeprazole.   

## 2022-12-28 NOTE — Assessment & Plan Note (Signed)
Continues singulair.  Stable.  No acute symptoms.

## 2022-12-28 NOTE — Assessment & Plan Note (Signed)
Continue crestor.  Low cholesterol diet and exercise.  Follow lipid panel and liver function tests.   Lab Results  Component Value Date   CHOL 170 08/26/2022   HDL 72.60 08/26/2022   LDLCALC 79 08/26/2022   LDLDIRECT 126.2 05/09/2013   TRIG 92.0 08/26/2022   CHOLHDL 2 08/26/2022

## 2022-12-28 NOTE — Assessment & Plan Note (Signed)
Low carb diet and exercise.  Follow met b and a1c.   

## 2022-12-28 NOTE — Progress Notes (Signed)
Subjective:    Patient ID: Robin Hill, female    DOB: [birth year], 72 y.o.   MRN: [id number]  Patient here for  Chief Complaint  Patient presents with   Annual Exam    HPI Here for a physical exam.  She reports she is doing well.  Feels good.  Stays active.  Going to the gym 3 days per week.  No chest pain or sob with increased activity or exertion.  No increased acid reflux.  No abdominal pain or bowel change reported.  Is s/p left mastectomy.  Some left arm stiffness at times.  No swelling.  Discussed PT.  States has been and has home exercises to do.  Will start doing exercise.  Seeing ophthalmology.  Recommended artificial tears.     Past Medical History:  Diagnosis Date   Allergic rhinitis    Allergy    Asthma    Breast cancer (HCC) 2012   mastectomy with chemo and rad tx   Diverticulosis    Dysmenorrhea    Hypercholesterolemia    Past Surgical History:  Procedure Laterality Date   BREAST BIOPSY Right 2012   benign   BREAST SURGERY  2011   breast cancer   CESAREAN SECTION  1970   CESAREAN SECTION  1973   CESAREAN SECTION  1987   COLONOSCOPY WITH PROPOFOL N/A 10/03/2015   Procedure: COLONOSCOPY WITH PROPOFOL;  Surgeon: Scot Jun, MD;  Location: [facility] ENDOSCOPY;  Service: Endoscopy;  Laterality: N/A;   MASTECTOMY Left 2012   with chemo and rad tx   NASAL SINUS SURGERY  07/2001   Family History  Problem Relation Age of Onset   Cirrhosis Brother        liver   Breast cancer Paternal Aunt        2 aunts   Social History   Socioeconomic History   Marital status: Divorced    Spouse name: Not on file   Number of children: Not on file   Years of education: Not on file   Highest education level: Not on file  Occupational History   Not on file  Tobacco Use   Smoking status: Former    Types: Cigarettes   Smokeless tobacco: Never  Vaping Use   Vaping status: Never Used  Substance and Sexual Activity   Alcohol use: Yes    Alcohol/week: 1.0 standard  drink of alcohol    Types: 1 Glasses of wine per week    Comment: OCC   Drug use: No   Sexual activity: Never  Other Topics Concern   Not on file  Social History Narrative   Not on file   Social Determinants of Health   Financial Resource Strain: Low Risk  (08/14/2021)   Overall Financial Resource Strain (CARDIA)    Difficulty of Paying Living Expenses: Not hard at all  Food Insecurity: No Food Insecurity (08/14/2021)   Hunger Vital Sign    Worried About Running Out of Food in the Last Year: Never true    Ran Out of Food in the Last Year: Never true  Transportation Needs: No Transportation Needs (08/14/2021)   PRAPARE - Administrator, Civil Service (Medical): No    Lack of Transportation (Non-Medical): No  Physical Activity: Sufficiently Active (08/14/2021)   Exercise Vital Sign    Days of Exercise per Week: 5 days    Minutes of Exercise per Session: 30 min  Stress: No Stress Concern Present (08/14/2021)   [employer] of  Occupational Health - Occupational Stress Questionnaire    Feeling of Stress : Not at all  Social Connections: Unknown (08/14/2021)   Social Connection and Isolation Panel [NHANES]    Frequency of Communication with Friends and Family: More than three times a week    Frequency of Social Gatherings with Friends and Family: More than three times a week    Attends Religious Services: More than 4 times per year    Active Member of [employer] or Organizations: Not on file    Attends Engineer, structural: More than 4 times per year    Marital Status: Not on file     Review of Systems  Constitutional:  Negative for appetite change and unexpected weight change.  HENT:  Negative for congestion, sinus pressure and sore throat.   Eyes:  Negative for pain and visual disturbance.  Respiratory:  Negative for cough, chest tightness and shortness of breath.   Cardiovascular:  Negative for chest pain, palpitations and leg swelling.  Gastrointestinal:   Negative for abdominal pain, diarrhea, nausea and vomiting.  Genitourinary:  Negative for difficulty urinating and dysuria.  Musculoskeletal:  Negative for joint swelling and myalgias.  Skin:  Negative for color change and rash.  Neurological:  Negative for dizziness and headaches.  Hematological:  Negative for adenopathy. Does not bruise/bleed easily.  Psychiatric/Behavioral:  Negative for agitation and dysphoric mood.        Objective:     BP 132/78   Pulse 78   Temp (!) 97.4 F (36.3 C)   Ht 5\' 4"  (1.626 m)   Wt 204 lb 6.4 oz (92.7 kg)   SpO2 99%   BMI 35.09 kg/m  Wt Readings from Last 3 Encounters:  12/28/22 204 lb 6.4 oz (92.7 kg)  08/24/22 202 lb (91.6 kg)  06/17/22 208 lb (94.3 kg)    Physical Exam Vitals reviewed.  Constitutional:      General: She is not in acute distress.    Appearance: Normal appearance.  HENT:     Head: Normocephalic and atraumatic.     Right Ear: External ear normal.     Left Ear: External ear normal.  Eyes:     General: No scleral icterus.       Right eye: No discharge.        Left eye: No discharge.     Conjunctiva/sclera: Conjunctivae normal.  Neck:     Thyroid: No thyromegaly.  Cardiovascular:     Rate and Rhythm: Normal rate and regular rhythm.  Pulmonary:     Effort: No respiratory distress.     Breath sounds: Normal breath sounds. No wheezing.     Comments: Breast - right breast - no nipple discharge or nipple retraction present.  Could not appreciate any distinct nodules or axillary adenopathy present.  S/p left mastectomy.  No masses, no adenopathy.  Abdominal:     General: Bowel sounds are normal.     Palpations: Abdomen is soft.     Tenderness: There is no abdominal tenderness.  Musculoskeletal:        General: No swelling or tenderness.     Cervical back: Neck supple. No tenderness.  Lymphadenopathy:     Cervical: No cervical adenopathy.  Skin:    Findings: No erythema or rash.  Neurological:     Mental Status:  She is alert.  Psychiatric:        Mood and Affect: Mood normal.        Behavior: Behavior normal.  Outpatient Encounter Medications as of 12/28/2022  Medication Sig   azelastine (ASTELIN) 0.1 % nasal spray Place 1 spray into both nostrils 2 (two) times daily. Use in each nostril as directed   BIOTIN 5000 PO Take 500 mg by mouth daily.   budesonide-formoterol (SYMBICORT) 160-4.5 MCG/ACT inhaler USE 2 INHALATIONS BY MOUTH  TWICE DAILY   CALCIUM CITRATE PO Take by mouth.   cholecalciferol (VITAMIN D) 400 UNITS TABS Take 400 Units by mouth daily. Chewable   glucosamine-chondroitin 500-400 MG tablet Take 1 tablet by mouth 3 (three) times daily.   Multiple Vitamins-Minerals (CENTRUM SILVER 50+WOMEN) TABS Take by mouth.   Omega-3 Fatty Acids (FISH OIL PO) Take 1 capsule by mouth daily.   omeprazole (PRILOSEC) 10 MG capsule Take 1 capsule (10 mg total) by mouth daily.   pregabalin (LYRICA) 100 MG capsule TAKE 1 CAPSULE BY MOUTH TWICE  DAILY   rosuvastatin (CRESTOR) 40 MG tablet TAKE 1 TABLET BY MOUTH DAILY   vitamin B-12 (CYANOCOBALAMIN) 1000 MCG tablet Take 1,000 mcg by mouth daily.   vitamin E 400 UNIT capsule Take 400 Units by mouth daily.   Zinc 50 MG TABS Take by mouth.   No facility-administered encounter medications on file as of 12/28/2022.     Lab Results  Component Value Date   WBC 6.3 12/24/2021   HGB 12.7 12/24/2021   HCT 37.7 12/24/2021   PLT 165.0 12/24/2021   GLUCOSE 109 (H) 08/26/2022   CHOL 170 08/26/2022   TRIG 92.0 08/26/2022   HDL 72.60 08/26/2022   LDLDIRECT 126.2 05/09/2013   LDLCALC 79 08/26/2022   ALT 18 08/26/2022   AST 23 08/26/2022   NA 141 08/26/2022   K 4.2 08/26/2022   CL 104 08/26/2022   CREATININE 0.88 08/26/2022   BUN 10 08/26/2022   CO2 31 08/26/2022   TSH 1.95 12/24/2021   HGBA1C 6.3 08/24/2022   MICROALBUR <0.7 09/09/2014    MM 3D SCREENING MAMMOGRAM UNILATERAL RIGHT BREAST  Result Date: 12/27/2022 CLINICAL DATA:  Screening. EXAM:  DIGITAL SCREENING UNILATERAL RIGHT MAMMOGRAM WITH CAD AND TOMOSYNTHESIS TECHNIQUE: Right screening digital craniocaudal and mediolateral oblique mammograms were obtained. Right screening digital breast tomosynthesis was performed. The images were evaluated with computer-aided detection. COMPARISON:  Previous exam(s). ACR Breast Density Category b: There are scattered areas of fibroglandular density. FINDINGS: There are no findings suspicious for malignancy. IMPRESSION: No mammographic evidence of malignancy. A result letter of this screening mammogram will be mailed directly to the patient. RECOMMENDATION: Screening mammogram in one year. (Code:SM-B-01Y) BI-RADS CATEGORY  1: Negative. Electronically Signed   By: Sherian Rein M.D.   On: 12/27/2022 15:38       Assessment & Plan:  Routine general medical examination at a health care facility  Hyperlipidemia, unspecified hyperlipidemia type Assessment & Plan: Continue crestor.  Low cholesterol diet and exercise.  Follow lipid panel and liver function tests.   Lab Results  Component Value Date   CHOL 170 08/26/2022   HDL 72.60 08/26/2022   LDLCALC 79 08/26/2022   LDLDIRECT 126.2 05/09/2013   TRIG 92.0 08/26/2022   CHOLHDL 2 08/26/2022    Orders: -     CBC with Differential/Platelet -     Basic metabolic panel -     Hepatic function panel -     Lipid panel -     TSH  Hyperglycemia Assessment & Plan: Low carb diet and exercise.  Follow met b and a1c.   Orders: -     Hemoglobin  A1c  Health care maintenance Assessment & Plan: Physical today 12/28/22.  PAP 08/2017.  Mammogram 12/24/22 - Birads I. Colonoscopy 2017 - recommended f/u in 10 years.     Mild intermittent asthma with acute exacerbation Assessment & Plan: Continue singulair and symbicort.  Breathing stable.  Follow.    Carcinoma of overlapping sites of left breast in female, estrogen receptor positive (HCC) Assessment & Plan: Followed by oncology.  Continue arimidex.      Chemotherapy-induced neuropathy (HCC) Assessment & Plan: Continue lyrica.     Environmental allergies Assessment & Plan: Continues singulair.  Stable.  No acute symptoms.    Gastroesophageal reflux disease without esophagitis Assessment & Plan: Controlled on omeprazole.       Dale Haddam, MD

## 2022-12-28 NOTE — Assessment & Plan Note (Signed)
Followed by oncology.  Continue arimidex.

## 2022-12-28 NOTE — Assessment & Plan Note (Signed)
Continue lyrica 

## 2022-12-28 NOTE — Assessment & Plan Note (Signed)
Physical today 12/28/22.  PAP 08/2017.  Mammogram 12/24/22 - Birads I. Colonoscopy 2017 - recommended f/u in 10 years.

## 2022-12-30 ENCOUNTER — Encounter: Payer: Self-pay | Admitting: *Deleted

## 2023-01-27 ENCOUNTER — Telehealth: Payer: Self-pay | Admitting: Internal Medicine

## 2023-01-27 NOTE — Telephone Encounter (Signed)
Copied from CRM [id number]. Topic: Medicare AWV >> Jan 27, 2023 11:33 AM Payton Doughty wrote: Reason for CRM: Called LM 01/27/2023 to schedule AWV   Verlee Rossetti; Care Guide Ambulatory Clinical Support [place] [facility] Direct Dial: [phone]

## 2023-01-27 NOTE — Telephone Encounter (Signed)
ERROR

## 2023-01-27 NOTE — Telephone Encounter (Deleted)
Copied from CRM [id number]. Topic: Medicare AWV >> Jan 27, 2023 11:29 AM Payton Doughty wrote: Reason for CRM: Called LM 01/27/2023 to schedule AWV   Verlee Rossetti; Care Guide Ambulatory Clinical Support [place] [facility] Direct Dial: [phone]

## 2023-02-18 ENCOUNTER — Ambulatory Visit (INDEPENDENT_AMBULATORY_CARE_PROVIDER_SITE_OTHER): Payer: Medicare Other | Admitting: *Deleted

## 2023-02-18 VITALS — Ht 64.0 in | Wt 205.0 lb

## 2023-02-18 DIAGNOSIS — Z0000 Encounter for general adult medical examination without abnormal findings: Secondary | ICD-10-CM | POA: Diagnosis not present

## 2023-02-18 NOTE — Patient Instructions (Signed)
Robin Hill , Thank you for taking time to come for your Medicare Wellness Visit. I appreciate your ongoing commitment to your health goals. Please review the following plan we discussed and let me know if I can assist you in the future.   Referrals/Orders/Follow-Ups/Clinician Recommendations: None  This is a list of the screening recommended for you and due dates:  Health Maintenance  Topic Date Due   COVID-19 Vaccine (5 - 2023-24 season) 01/02/2023   Flu Shot  08/01/2023*   Mammogram  12/24/2023   Medicare Annual Wellness Visit  02/18/2024   Colon Cancer Screening  10/02/2025   DTaP/Tdap/Td vaccine (2 - Td or Tdap) 12/08/2025   Pneumonia Vaccine  Completed   DEXA scan (bone density measurement)  Completed   Hepatitis C Screening  Completed   Zoster (Shingles) Vaccine  Completed   HPV Vaccine  Aged Out  *Topic was postponed. The date shown is not the original due date.    Advanced directives: (In Chart) A copy of your advanced directives are scanned into your chart should your provider ever need it.  Next Medicare Annual Wellness Visit scheduled for next year: Yes 02/24/24 @ 11:15

## 2023-02-18 NOTE — Progress Notes (Signed)
Subjective:   Robin Hill is a 72 y.o. female who presents for Medicare Annual (Subsequent) preventive examination.  Visit Complete: Virtual I connected with  Robin Hill on 02/18/23 by a audio enabled telemedicine application and verified that I am speaking with the correct person using two identifiers.  Patient Location: Home  Provider Location: Home Office  I discussed the limitations of evaluation and management by telemedicine. The patient expressed understanding and agreed to proceed.  Vital Signs: Because this visit was a virtual/telehealth visit, some criteria may be missing or patient reported. Any vitals not documented were not able to be obtained and vitals that have been documented are patient reported.   Cardiac Risk Factors include: advanced age (>34men, >70 women);dyslipidemia     Objective:    Today's Vitals   02/18/23 1112  Weight: 205 lb (93 kg)  Height: 5\' 4"  (1.626 m)   Body mass index is 35.19 kg/m.     02/18/2023   11:23 AM 08/14/2021    2:02 PM 12/30/2020    1:07 PM 05/13/2020    1:19 PM 12/29/2018    1:49 PM 06/05/2018    2:11 PM 12/28/2017    2:39 PM  Advanced Directives  Does Patient Have a Medical Advance Directive? Yes Yes Yes Yes Yes No Yes  Type of Estate agent of [place];Living will Healthcare Power of [place];Living will Healthcare Power of [place];Living will Healthcare Power of [place];Living will Healthcare Power of [place];Living will  Living will;Healthcare Power of Attorney  Does patient want to make changes to medical advance directive? No - Patient declined No - Patient declined No - Patient declined No - Patient declined No - Patient declined  No - Patient declined  Copy of Healthcare Power of Attorney in Chart? Yes - validated most recent copy scanned in chart (See row information) Yes - validated most recent copy scanned in chart (See row information) Yes - validated most recent copy scanned in chart  (See row information) Yes - validated most recent copy scanned in chart (See row information) No - copy requested  Yes  Would patient like information on creating a medical advance directive?     No - Patient declined No - Patient declined     Current Medications (verified) Outpatient Encounter Medications as of 02/18/2023  Medication Sig   azelastine (ASTELIN) 0.1 % nasal spray Place 1 spray into both nostrils 2 (two) times daily. Use in each nostril as directed   BIOTIN 5000 PO Take 500 mg by mouth daily.   budesonide-formoterol (SYMBICORT) 160-4.5 MCG/ACT inhaler USE 2 INHALATIONS BY MOUTH  TWICE DAILY   CALCIUM CITRATE PO Take by mouth.   cholecalciferol (VITAMIN D) 400 UNITS TABS Take 400 Units by mouth daily. Chewable   glucosamine-chondroitin 500-400 MG tablet Take 1 tablet by mouth 3 (three) times daily.   Multiple Vitamins-Minerals (CENTRUM SILVER 50+WOMEN) TABS Take by mouth.   Omega-3 Fatty Acids (FISH OIL PO) Take 1 capsule by mouth daily.   omeprazole (PRILOSEC) 10 MG capsule Take 1 capsule (10 mg total) by mouth daily.   pregabalin (LYRICA) 100 MG capsule TAKE 1 CAPSULE BY MOUTH TWICE  DAILY   rosuvastatin (CRESTOR) 40 MG tablet TAKE 1 TABLET BY MOUTH DAILY   vitamin B-12 (CYANOCOBALAMIN) 1000 MCG tablet Take 1,000 mcg by mouth daily.   vitamin E 400 UNIT capsule Take 400 Units by mouth daily.   Zinc 50 MG TABS Take by mouth.   No facility-administered encounter medications on  file as of 02/18/2023.    Allergies (verified) Patient has no known allergies.   History: Past Medical History:  Diagnosis Date   Allergic rhinitis    Allergy    Asthma    Breast cancer (HCC) 2012   mastectomy with chemo and rad tx   Diverticulosis    Dysmenorrhea    Hypercholesterolemia    Past Surgical History:  Procedure Laterality Date   BREAST BIOPSY Right 2012   benign   BREAST SURGERY  2011   breast cancer   CESAREAN SECTION  1970   CESAREAN SECTION  1973   CESAREAN SECTION   1987   COLONOSCOPY WITH PROPOFOL N/A 10/03/2015   Procedure: COLONOSCOPY WITH PROPOFOL;  Surgeon: Scot Jun, MD;  Location: [facility] ENDOSCOPY;  Service: Endoscopy;  Laterality: N/A;   MASTECTOMY Left 2012   with chemo and rad tx   NASAL SINUS SURGERY  07/2001   Family History  Problem Relation Age of Onset   Cirrhosis Brother        liver   Breast cancer Paternal Aunt        2 aunts   Social History   Socioeconomic History   Marital status: Divorced    Spouse name: Not on file   Number of children: Not on file   Years of education: Not on file   Highest education level: Not on file  Occupational History   Not on file  Tobacco Use   Smoking status: Former    Types: Cigarettes   Smokeless tobacco: Never  Vaping Use   Vaping status: Never Used  Substance and Sexual Activity   Alcohol use: Yes    Alcohol/week: 1.0 standard drink of alcohol    Types: 1 Glasses of wine per week    Comment: OCC   Drug use: No   Sexual activity: Never  Other Topics Concern   Not on file  Social History Narrative   Not on file   Social Determinants of Health   Financial Resource Strain: Low Risk  (02/18/2023)   Overall Financial Resource Strain (CARDIA)    Difficulty of Paying Living Expenses: Not hard at all  Food Insecurity: No Food Insecurity (02/18/2023)   Hunger Vital Sign    Worried About Running Out of Food in the Last Year: Never true    Ran Out of Food in the Last Year: Never true  Transportation Needs: No Transportation Needs (02/18/2023)   PRAPARE - Administrator, Civil Service (Medical): No    Lack of Transportation (Non-Medical): No  Physical Activity: Sufficiently Active (02/18/2023)   Exercise Vital Sign    Days of Exercise per Week: 3 days    Minutes of Exercise per Session: 60 min  Stress: No Stress Concern Present (02/18/2023)   [employer] of Occupational Health - Occupational Stress Questionnaire    Feeling of Stress : Not at all  Social  Connections: Moderately Integrated (02/18/2023)   Social Connection and Isolation Panel [NHANES]    Frequency of Communication with Friends and Family: More than three times a week    Frequency of Social Gatherings with Friends and Family: More than three times a week    Attends Religious Services: More than 4 times per year    Active Member of [employer] or Organizations: Yes    Attends Engineer, structural: More than 4 times per year    Marital Status: Divorced    Tobacco Counseling Counseling given: Not Answered   Clinical Intake:  Pre-visit preparation completed: Yes  Pain : No/denies pain     BMI - recorded: 35.19 Nutritional Status: BMI > 30  Obese Nutritional Risks: None Diabetes: No  How often do you need to have someone help you when you read instructions, pamphlets, or other written materials from your doctor or pharmacy?: 1 - Never  Interpreter Needed?: No  Information entered by :: R. Raza Bayless LPN   Activities of Daily Living    02/18/2023   11:14 AM  In your present state of health, do you have any difficulty performing the following activities:  Hearing? 0  Vision? 0  Difficulty concentrating or making decisions? 0  Walking or climbing stairs? 0  Dressing or bathing? 0  Doing errands, shopping? 0  Preparing Food and eating ? N  Using the Toilet? N  In the past six months, have you accidently leaked urine? N  Do you have problems with loss of bowel control? N  Managing your Medications? N  Managing your Finances? N  Housekeeping or managing your Housekeeping? N    Patient Care Team: Dale [place], MD as PCP - General (Internal Medicine) Earna Coder, MD as Consulting Physician (Hematology and Oncology)  Indicate any recent Medical Services you may have received from other than [facility] providers in the past year (date may be approximate).     Assessment:   This is a routine wellness examination for Zettie.  Hearing/Vision  screen Hearing Screening - Comments:: No issues Vision Screening - Comments:: glasses   Goals Addressed             This Visit's Progress    Patient Stated       Wants to continue to exercise       Depression Screen    02/18/2023   11:19 AM 12/28/2022    1:02 PM 06/17/2022    2:22 PM 04/22/2022    2:27 PM 12/24/2021    3:01 PM 08/14/2021    1:58 PM 05/13/2020    1:06 PM  PHQ 2/9 Scores  PHQ - 2 Score 0 0 0 0 0 0 0  PHQ- 9 Score 0 0         Fall Risk    02/18/2023   11:15 AM 12/28/2022    1:01 PM 06/17/2022    2:22 PM 04/22/2022    2:27 PM 12/24/2021    3:01 PM  Fall Risk   Falls in the past year? 1 0 1 0 0  Number falls in past yr: 0 0 1 0 0  Injury with Fall? 1 0 0 0 0  Risk for fall due to : History of fall(s);Impaired balance/gait No Fall Risks History of fall(s) No Fall Risks No Fall Risks  Follow up Falls evaluation completed;Falls prevention discussed Falls evaluation completed Falls evaluation completed Falls evaluation completed Falls evaluation completed    MEDICARE RISK AT HOME: Medicare Risk at Home Any stairs in or around the home?: Yes If so, are there any without handrails?: No Home free of loose throw rugs in walkways, pet beds, electrical cords, etc?: Yes Adequate lighting in your home to reduce risk of falls?: Yes Life alert?: No Use of a cane, walker or w/c?: No Grab bars in the bathroom?: Yes Shower chair or bench in shower?: No Elevated toilet seat or a handicapped toilet?: Yes   Cognitive Function:    06/05/2018    2:24 PM 06/02/2017    4:30 PM  MMSE - Mini Mental State Exam  Orientation to  time 5 5  Orientation to Place 5 5  Registration 3 3  Attention/ Calculation 4 5  Recall 2 3  Language- name 2 objects 2 2  Language- repeat 1 1  Language- follow 3 step command 3 3  Language- read & follow direction 1 1  Write a sentence 1 1  Copy design 1 1  Total score 28 30        02/18/2023   11:23 AM 05/28/2016   11:22 AM  6CIT  Screen  What Year? 0 points 0 points  What month? 0 points 0 points  What time? 0 points 0 points  Count back from 20 0 points 0 points  Months in reverse 2 points 0 points  Repeat phrase 0 points 0 points  Total Score 2 points 0 points    Immunizations Immunization History  Administered Date(s) Administered   Influenza Split 02/22/2014   Influenza Whole 02/17/2017   Influenza, High Dose Seasonal PF 01/10/2018, 12/29/2018   Influenza-Unspecified 02/24/2015, 12/09/2015, 01/02/2020   PFIZER(Purple Top)SARS-COV-2 Vaccination 06/26/2019, 07/17/2019, 02/21/2020, 09/26/2020   PNEUMOCOCCAL CONJUGATE-20 08/25/2021   Pneumococcal Conjugate-13 06/05/2016   Pneumococcal Polysaccharide-23 02/24/2015   Tdap 12/09/2015   Zoster Recombinant(Shingrix) 08/13/2016, 12/29/2018    TDAP status: Up to date   Flu Vaccine status: Up to date per patient will get confirmation  Pneumococcal vaccine status: Up to date  Covid-19 vaccine status: Information provided on how to obtain vaccines.   Qualifies for Shingles Vaccine? Yes   Zostavax completed No   Shingrix Completed?: Yes  Screening Tests Health Maintenance  Topic Date Due   Medicare Annual Wellness (AWV)  08/15/2022   COVID-19 Vaccine (5 - 2023-24 season) 01/02/2023   INFLUENZA VACCINE  08/01/2023 (Originally 12/02/2022)   MAMMOGRAM  12/24/2023   Colonoscopy  10/02/2025   DTaP/Tdap/Td (2 - Td or Tdap) 12/08/2025   Pneumonia Vaccine 18+ Years old  Completed   DEXA SCAN  Completed   Hepatitis C Screening  Completed   Zoster Vaccines- Shingrix  Completed   HPV VACCINES  Aged Out    Health Maintenance  Health Maintenance Due  Topic Date Due   Medicare Annual Wellness (AWV)  08/15/2022   COVID-19 Vaccine (5 - 2023-24 season) 01/02/2023    Colorectal cancer screening: Type of screening: Colonoscopy. Completed 10/2015. Repeat every 10 years  Mammogram status: Completed 12/2022. Repeat every year  Bone Density status: Completed  01/2020. Results reflect: Bone density results: NORMAL. Repeat every 2 years.  Lung Cancer Screening: (Low Dose CT Chest recommended if Age 21-80 years, 20 pack-year currently smoking OR have quit w/in 15years.) does not qualify.     Additional Screening:  Hepatitis C Screening: does qualify; Completed 05/2017  Vision Screening: Recommended annual ophthalmology exams for early detection of glaucoma and other disorders of the eye. Is the patient up to date with their annual eye exam?  No  Who is the provider or what is the name of the office in which the patient attends annual eye exams? [facility] If pt is not established with a provider, would they like to be referred to a provider to establish care? No .   Dental Screening: Recommended annual dental exams for proper oral hygiene   Community Resource Referral / Chronic Care Management: CRR required this visit?  No   CCM required this visit?  No     Plan:     I have personally reviewed and noted the following in the patient's chart:   Medical and social  history Use of alcohol, tobacco or illicit drugs  Current medications and supplements including opioid prescriptions. Patient is not currently taking opioid prescriptions. Functional ability and status Nutritional status Physical activity Advanced directives List of other physicians Hospitalizations, surgeries, and ER visits in previous 12 months Vitals Screenings to include cognitive, depression, and falls Referrals and appointments  In addition, I have reviewed and discussed with patient certain preventive protocols, quality metrics, and best practice recommendations. A written personalized care plan for preventive services as well as general preventive health recommendations were provided to patient.     Sydell Axon, LPN   [id number]/[id number]   After Visit Summary: (MyChart) Due to this being a telephonic visit, the after visit summary with patients personalized plan was  offered to patient via MyChart   Nurse Notes: None

## 2023-03-04 ENCOUNTER — Telehealth: Payer: Self-pay

## 2023-03-04 NOTE — Telephone Encounter (Signed)
Prescription Request  03/04/2023  LOV: Visit date not found  What is the name of the medication or equipment? omeprazole (PRILOSEC) 10 MG capsule, pregabalin (LYRICA) 100 MG capsule, sluticasone propionate (nasal spray 50 MG) and rosuvastatin (CRESTOR) 40 MG tablet.  Have you contacted your pharmacy to request a refill? No   Which pharmacy would you like this sent to?   [facility]) [place], [place] - [address] [facility] [address] [place] [address] [place] [address] Phone: [phone] Fax: [phone]  [facility] - [place], [place] - [address] [place] [address] Phone: [phone] Fax: [phone]    Patient notified that their request is being sent to the clinical staff for review and that they should receive a response within 2 business days.   Please advise at Mobile [phone] (mobile)  Patient states she is using [facility], but she is not sure which one it is, there are two listed.

## 2023-03-07 NOTE — Telephone Encounter (Signed)
Patient just called back. I read her the message.

## 2023-03-07 NOTE — Telephone Encounter (Signed)
Noted  

## 2023-03-07 NOTE — Telephone Encounter (Signed)
LM for patient. Her refills for omeprazole and rosuvastatin are current. Dr Donneta Romberg prescribes her Lyrica.

## 2023-04-25 ENCOUNTER — Other Ambulatory Visit: Payer: Self-pay | Admitting: *Deleted

## 2023-04-25 DIAGNOSIS — G629 Polyneuropathy, unspecified: Secondary | ICD-10-CM

## 2023-04-27 ENCOUNTER — Other Ambulatory Visit: Payer: Self-pay | Admitting: Internal Medicine

## 2023-05-04 NOTE — Progress Notes (Signed)
 Subjective:    Patient ID: Robin Hill, female    DOB: 06/17/1950, 73 y.o.   MRN: [id number]  Patient here for  Chief Complaint  Patient presents with   Medical Management of Chronic Issues    HPI Here for a scheduled follow up - f/u regarding hyperglycemia, hypercholesterolemia and GERD. Also, history of breast cancer s/p left breast mastectomy. 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 Continues on symbicort  and singulair  for mild asthma. Breathing is stable.  Allergy symptoms controlled. No chest pain or sob reported.    Past Medical History:  Diagnosis Date   Allergic rhinitis    Allergy    Asthma    Breast cancer (HCC) 2012   mastectomy with chemo and rad tx   Diverticulosis    Dysmenorrhea    Hypercholesterolemia    Past Surgical History:  Procedure Laterality Date   BREAST BIOPSY Right 2012   benign   BREAST SURGERY  2011   breast cancer   CESAREAN SECTION  1970   CESAREAN SECTION  1973   CESAREAN SECTION  1987   COLONOSCOPY WITH PROPOFOL  N/A 10/03/2015   Procedure: COLONOSCOPY WITH PROPOFOL ;  Surgeon: Lamar ONEIDA Holmes, MD;  Location: [facility] ENDOSCOPY;  Service: Endoscopy;  Laterality: N/A;   MASTECTOMY Left 2012   with chemo and rad tx   NASAL SINUS SURGERY  07/2001   Family History  Problem Relation Age of Onset   Cirrhosis Brother        liver   Breast cancer Paternal Aunt        2 aunts   Social History   Socioeconomic History   Marital status: Divorced    Spouse name: Not on file   Number of children: Not on file   Years of education: Not on file   Highest education level: Not on file  Occupational History   Not on file  Tobacco Use   Smoking status: Former    Types: Cigarettes   Smokeless tobacco: Never  Vaping Use   Vaping status: Never Used  Substance and Sexual Activity   Alcohol use: Yes    Alcohol/week: 1.0 standard drink of alcohol    Types: 1 Glasses of wine per week    Comment: OCC   Drug use: No   Sexual activity:  Never  Other Topics Concern   Not on file  Social History Narrative   Not on file   Social Drivers of Health   Financial Resource Strain: Low Risk  (02/18/2023)   Overall Financial Resource Strain (CARDIA)    Difficulty of Paying Living Expenses: Not hard at all  Food Insecurity: No Food Insecurity (02/18/2023)   Hunger Vital Sign    Worried About Running Out of Food in the Last Year: Never true    Ran Out of Food in the Last Year: Never true  Transportation Needs: No Transportation Needs (02/18/2023)   PRAPARE - Administrator, Civil Service (Medical): No    Lack of Transportation (Non-Medical): No  Physical Activity: Sufficiently Active (02/18/2023)   Exercise Vital Sign    Days of Exercise per Week: 3 days    Minutes of Exercise per Session: 60 min  Stress: No Stress Concern Present (02/18/2023)   [employer] of Occupational Health - Occupational Stress Questionnaire    Feeling of Stress : Not at all  Social Connections: Moderately Integrated (02/18/2023)   Social Connection and Isolation Panel [NHANES]    Frequency of Communication with Friends and  Family: More than three times a week    Frequency of Social Gatherings with Friends and Family: More than three times a week    Attends Religious Services: More than 4 times per year    Active Member of [employer] or Organizations: Yes    Attends Engineer, Structural: More than 4 times per year    Marital Status: Divorced     Review of Systems  Constitutional:  Negative for appetite change and unexpected weight change.  HENT:  Negative for congestion and sinus pressure.   Respiratory:  Negative for cough, chest tightness and shortness of breath.   Cardiovascular:  Negative for chest pain and palpitations.  Gastrointestinal:  Negative for abdominal pain, diarrhea, nausea and vomiting.  Genitourinary:  Negative for difficulty urinating and dysuria.  Musculoskeletal:  Negative for joint swelling and  myalgias.  Skin:  Negative for color change and rash.  Neurological:  Negative for dizziness and headaches.  Psychiatric/Behavioral:  Negative for agitation and dysphoric mood.        Objective:     BP 126/70   Pulse 80   Temp 98 F (36.7 C)   Resp 16   Ht 5' 4 (1.626 m)   Wt 211 lb (95.7 kg)   SpO2 98%   BMI 36.22 kg/m  Wt Readings from Last 3 Encounters:  05/05/23 211 lb (95.7 kg)  02/18/23 205 lb (93 kg)  12/28/22 204 lb 6.4 oz (92.7 kg)    Physical Exam Vitals reviewed.  Constitutional:      General: She is not in acute distress.    Appearance: Normal appearance.  HENT:     Head: Normocephalic and atraumatic.     Right Ear: External ear normal.     Left Ear: External ear normal.     Mouth/Throat:     Pharynx: No oropharyngeal exudate or posterior oropharyngeal erythema.  Eyes:     General: No scleral icterus.       Right eye: No discharge.        Left eye: No discharge.     Conjunctiva/sclera: Conjunctivae normal.  Neck:     Thyroid : No thyromegaly.  Cardiovascular:     Rate and Rhythm: Normal rate and regular rhythm.  Pulmonary:     Effort: No respiratory distress.     Breath sounds: Normal breath sounds. No wheezing.  Abdominal:     General: Bowel sounds are normal.     Palpations: Abdomen is soft.     Tenderness: There is no abdominal tenderness.  Musculoskeletal:        General: No swelling or tenderness.     Cervical back: Neck supple. No tenderness.  Lymphadenopathy:     Cervical: No cervical adenopathy.  Skin:    Findings: No erythema or rash.  Neurological:     Mental Status: She is alert.  Psychiatric:        Mood and Affect: Mood normal.        Behavior: Behavior normal.      Outpatient Encounter Medications as of 05/05/2023  Medication Sig   azelastine  (ASTELIN ) 0.1 % nasal spray Place 1 spray into both nostrils 2 (two) times daily. Use in each nostril as directed   BIOTIN 5000 PO Take 500 mg by mouth daily.    budesonide -formoterol  (SYMBICORT ) 160-4.5 MCG/ACT inhaler USE 2 INHALATIONS BY MOUTH  TWICE DAILY   CALCIUM  CITRATE PO Take by mouth.   cholecalciferol (VITAMIN D) 400 UNITS TABS Take 400 Units by mouth daily.  Chewable   glucosamine-chondroitin 500-400 MG tablet Take 1 tablet by mouth 3 (three) times daily.   Multiple Vitamins-Minerals (CENTRUM SILVER 50+WOMEN) TABS Take by mouth.   Omega-3 Fatty Acids (FISH OIL PO) Take 1 capsule by mouth daily.   omeprazole  (PRILOSEC) 10 MG capsule TAKE 1 CAPSULE BY MOUTH DAILY   pregabalin  (LYRICA ) 100 MG capsule TAKE 1 CAPSULE BY MOUTH TWICE  DAILY   rosuvastatin  (CRESTOR ) 40 MG tablet TAKE 1 TABLET BY MOUTH DAILY   vitamin B-12 (CYANOCOBALAMIN ) 1000 MCG tablet Take 1,000 mcg by mouth daily.   vitamin E 400 UNIT capsule Take 400 Units by mouth daily.   Zinc 50 MG TABS Take by mouth.   No facility-administered encounter medications on file as of 05/05/2023.     Lab Results  Component Value Date   WBC 6.2 12/28/2022   HGB 13.0 12/28/2022   HCT 39.7 12/28/2022   PLT 191.0 12/28/2022   GLUCOSE 86 05/05/2023   CHOL 171 05/05/2023   TRIG 60.0 05/05/2023   HDL 74.10 05/05/2023   LDLDIRECT 126.2 05/09/2013   LDLCALC 85 05/05/2023   ALT 23 05/05/2023   AST 30 05/05/2023   NA 140 05/05/2023   K 3.9 05/05/2023   CL 103 05/05/2023   CREATININE 0.83 05/05/2023   BUN 14 05/05/2023   CO2 27 05/05/2023   TSH 1.64 12/28/2022   HGBA1C 6.3 05/05/2023   MICROALBUR <0.7 09/09/2014    MM 3D SCREENING MAMMOGRAM UNILATERAL RIGHT BREAST Result Date: 12/27/2022 CLINICAL DATA:  Screening. EXAM: DIGITAL SCREENING UNILATERAL RIGHT MAMMOGRAM WITH CAD AND TOMOSYNTHESIS TECHNIQUE: Right screening digital craniocaudal and mediolateral oblique mammograms were obtained. Right screening digital breast tomosynthesis was performed. The images were evaluated with computer-aided detection. COMPARISON:  Previous exam(s). ACR Breast Density Category b: There are scattered areas of  fibroglandular density. FINDINGS: There are no findings suspicious for malignancy. IMPRESSION: No mammographic evidence of malignancy. A result letter of this screening mammogram will be mailed directly to the patient. RECOMMENDATION: Screening mammogram in one year. (Code:SM-B-01Y) BI-RADS CATEGORY  1: Negative. Electronically Signed   By: Craig Farr M.D.   On: 12/27/2022 15:38       Assessment & Plan:  Hyperglycemia Assessment & Plan: Low carb diet and exercise.  Follow met b and a1c.   Orders: -     Hemoglobin A1c  Hyperlipidemia, unspecified hyperlipidemia type Assessment & Plan: Continue crestor .  Low cholesterol diet and exercise.  Follow lipid panel and liver function tests.   Lab Results  Component Value Date   CHOL 171 05/05/2023   HDL 74.10 05/05/2023   LDLCALC 85 05/05/2023   LDLDIRECT 126.2 05/09/2013   TRIG 60.0 05/05/2023   CHOLHDL 2 05/05/2023    Orders: -     Lipid panel -     Hepatic function panel -     Basic metabolic panel  History of breast cancer Assessment & Plan: Completed 10 years of aromatase inhibitor.  Mammogram 12/24/22 - [facility] I.     Gastroesophageal reflux disease without esophagitis Assessment & Plan: Controlled on omeprazole .    Environmental allergies Assessment & Plan: Continues singulair .  Stable.  No acute symptoms.    Chemotherapy-induced neuropathy (HCC) Assessment & Plan: Continue lyrica .     Mild intermittent asthma with acute exacerbation Assessment & Plan: Continue singulair  and symbicort .  Breathing stable.  Follow.       Allena Hamilton, MD

## 2023-05-05 ENCOUNTER — Ambulatory Visit (INDEPENDENT_AMBULATORY_CARE_PROVIDER_SITE_OTHER): Payer: Medicare Other | Admitting: Internal Medicine

## 2023-05-05 VITALS — BP 126/70 | HR 80 | Temp 98.0°F | Resp 16 | Ht 64.0 in | Wt 211.0 lb

## 2023-05-05 DIAGNOSIS — R739 Hyperglycemia, unspecified: Secondary | ICD-10-CM | POA: Diagnosis not present

## 2023-05-05 DIAGNOSIS — Z9109 Other allergy status, other than to drugs and biological substances: Secondary | ICD-10-CM | POA: Diagnosis not present

## 2023-05-05 DIAGNOSIS — Z853 Personal history of malignant neoplasm of breast: Secondary | ICD-10-CM | POA: Diagnosis not present

## 2023-05-05 DIAGNOSIS — J4521 Mild intermittent asthma with (acute) exacerbation: Secondary | ICD-10-CM | POA: Diagnosis not present

## 2023-05-05 DIAGNOSIS — G620 Drug-induced polyneuropathy: Secondary | ICD-10-CM

## 2023-05-05 DIAGNOSIS — K219 Gastro-esophageal reflux disease without esophagitis: Secondary | ICD-10-CM

## 2023-05-05 DIAGNOSIS — E785 Hyperlipidemia, unspecified: Secondary | ICD-10-CM | POA: Diagnosis not present

## 2023-05-05 DIAGNOSIS — T451X5A Adverse effect of antineoplastic and immunosuppressive drugs, initial encounter: Secondary | ICD-10-CM | POA: Diagnosis not present

## 2023-05-06 LAB — LIPID PANEL
Cholesterol: 171 mg/dL (ref 0–200)
HDL: 74.1 mg/dL (ref >39.00)
LDL Cholesterol: 85 mg/dL (ref 0–99)
NonHDL: 96.92
Total CHOL/HDL Ratio: 2
Triglycerides: 60 mg/dL (ref 0.0–149.0)
VLDL: 12 mg/dL (ref 0.0–40.0)

## 2023-05-06 LAB — HEMOGLOBIN A1C: Hgb A1c MFr Bld: 6.3 % (ref 4.6–6.5)

## 2023-05-06 LAB — BASIC METABOLIC PANEL
BUN: 14 mg/dL (ref 6–23)
CO2: 27 meq/L (ref 19–32)
Calcium: 9.7 mg/dL (ref 8.4–10.5)
Chloride: 103 meq/L (ref 96–112)
Creatinine, Ser: 0.83 mg/dL (ref 0.40–1.20)
GFR: 70.05 mL/min (ref >60.00)
Glucose, Bld: 86 mg/dL (ref 70–99)
Potassium: 3.9 meq/L (ref 3.5–5.1)
Sodium: 140 meq/L (ref 135–145)

## 2023-05-06 LAB — HEPATIC FUNCTION PANEL
ALT: 23 U/L (ref 0–35)
AST: 30 U/L (ref 0–37)
Albumin: 4.4 g/dL (ref 3.5–5.2)
Alkaline Phosphatase: 57 U/L (ref 39–117)
Bilirubin, Direct: 0.1 mg/dL (ref 0.0–0.3)
Total Bilirubin: 0.6 mg/dL (ref 0.2–1.2)
Total Protein: 7.4 g/dL (ref 6.0–8.3)

## 2023-05-08 ENCOUNTER — Encounter: Payer: Self-pay | Admitting: Internal Medicine

## 2023-05-08 NOTE — Assessment & Plan Note (Signed)
 Low carb diet and exercise.  Follow met b and a1c.

## 2023-05-08 NOTE — Assessment & Plan Note (Signed)
Continue singulair and symbicort.  Breathing stable.  Follow.  

## 2023-05-08 NOTE — Assessment & Plan Note (Signed)
 Completed 10 years of aromatase inhibitor.  Mammogram 12/24/22 - Birads I.

## 2023-05-08 NOTE — Assessment & Plan Note (Signed)
 Controlled on omeprazole.

## 2023-05-08 NOTE — Assessment & Plan Note (Signed)
 Continue crestor .  Low cholesterol diet and exercise.  Follow lipid panel and liver function tests.   Lab Results  Component Value Date   CHOL 171 05/05/2023   HDL 74.10 05/05/2023   LDLCALC 85 05/05/2023   LDLDIRECT 126.2 05/09/2013   TRIG 60.0 05/05/2023   CHOLHDL 2 05/05/2023

## 2023-05-08 NOTE — Assessment & Plan Note (Signed)
 Continue lyrica

## 2023-05-08 NOTE — Assessment & Plan Note (Signed)
Continues singulair.  Stable.  No acute symptoms.

## 2023-05-11 ENCOUNTER — Encounter: Payer: Self-pay | Admitting: Internal Medicine

## 2023-05-11 DIAGNOSIS — G629 Polyneuropathy, unspecified: Secondary | ICD-10-CM

## 2023-05-12 NOTE — Telephone Encounter (Signed)
 Pt requesting refill for Lyrica previously filled by oncology. Did you want to take over prescription or should pt reach out to oncology?    E-Prescribing Status: Receipt confirmed by pharmacy (09/20/2022  2:02 PM EDT) Earna Coder, MD

## 2023-05-13 MED ORDER — PREGABALIN 100 MG PO CAPS: 100.0000 mg | ORAL_CAPSULE | Freq: Two times a day (BID) | ORAL | 1 refills | Status: DC

## 2023-05-13 NOTE — Telephone Encounter (Signed)
 Rx sent in for lyrica #180 with one refill.

## 2023-07-03 ENCOUNTER — Other Ambulatory Visit: Payer: Self-pay | Admitting: Internal Medicine

## 2023-07-03 DIAGNOSIS — E785 Hyperlipidemia, unspecified: Secondary | ICD-10-CM

## 2023-07-04 NOTE — Telephone Encounter (Signed)
 Copied from CRM [id number]. Topic: Clinical - Medical Advice >> Jul 04, 2023 12:14 PM Fredrich Romans wrote: Reason for CRM: Patient has been having a lot of phlemg in her throat for the 2 week,that she she's not able to get rid of.She would like advice as to anything that she can do or any medications that she can take over the counter?

## 2023-07-05 NOTE — Telephone Encounter (Signed)
 Patient called back in she would like a call back to the number [phone] regarding her issue she message about yesterday

## 2023-07-11 ENCOUNTER — Encounter: Payer: Self-pay | Admitting: Internal Medicine

## 2023-07-13 ENCOUNTER — Ambulatory Visit: Payer: Self-pay | Admitting: Internal Medicine

## 2023-07-13 ENCOUNTER — Ambulatory Visit: Admitting: Internal Medicine

## 2023-07-13 ENCOUNTER — Telehealth: Payer: Self-pay

## 2023-07-13 ENCOUNTER — Encounter: Payer: Self-pay | Admitting: Internal Medicine

## 2023-07-13 VITALS — BP 132/84 | HR 85 | Temp 98.0°F | Ht 64.0 in | Wt 212.2 lb

## 2023-07-13 DIAGNOSIS — J069 Acute upper respiratory infection, unspecified: Secondary | ICD-10-CM | POA: Diagnosis not present

## 2023-07-13 DIAGNOSIS — T781XXA Other adverse food reactions, not elsewhere classified, initial encounter: Secondary | ICD-10-CM | POA: Diagnosis not present

## 2023-07-13 NOTE — Assessment & Plan Note (Signed)
-  Patient states that yesterday she developed tingling and numbness over her lips and thought that it was secondary to an allergy.  This resolved with taking Benadryl. -Patient states that she has similar symptoms when she had a wings at a [employer] and believes this is secondary to MSG -I suspect that patient likely has an oral allergy syndrome which is consistent with her symptoms of tingling and numbness of her lips.  She she has no swelling or any symptoms concerning for anaphylaxis -This is usually caused by a cross reaction between pollen allergens and raw fruits or vegetables or nuts -This appears to be an isolated incident and no further workup is required -I did advise her to use Zyrtec instead of Benadryl when she gets the symptoms again to see if she can get the antihistamine effect without the drowsiness caused by Benadryl -No further workup at this time

## 2023-07-13 NOTE — Telephone Encounter (Signed)
  Chief Complaint: green mucus Symptoms: green mucus running from nose, headache, redness around mouth Frequency: on and off over the "past few weeks", worsening Pertinent Negatives: Patient denies fever, sob, cough, sore throat Disposition: [] ED /[] Urgent Care (no appt availability in office) / [x] Appointment(In office/virtual)/ []  [facility]/ [] Home Care/ [] Refused Recommended Disposition /[] [facility]/ []  Follow-up with PCP Additional Notes: Patient reports that she has been coughing up green phlegm, has a headache that comes and goes, and that she is noticing redness around her mouth. Patient reports she is unsure if it is allergies causing her symptoms but that she is concerned about the green phlegm and feels it is not going away. Per protocol, appt scheduled today 3/12. Advised to call back with worsening symptoms. Pt verbalized understanding.   Copied from CRM [id number]. Topic: Clinical - Red Word Triage >> Jul 13, 2023  8:15 AM Theodis Sato wrote: Red Word that prompted transfer to Nurse Triage: Chilton Si mucus/phlegm with a headache that comes and goes. Reason for Disposition  [1] Redness or swelling on the cheek, forehead or around the eye AND [2] no fever  Answer Assessment - Initial Assessment Questions 1. LOCATION: "Where does it hurt?"      No pain 2. ONSET: "When did the sinus pain start?"  (e.g., hours, days)      On and off over the last 3 weeks 3. SEVERITY: "How bad is the pain?"   (Scale 1-10; mild, moderate or severe)   - MILD (1-3): doesn't interfere with normal activities    - MODERATE (4-7): interferes with normal activities (e.g., work or school) or awakens from sleep   - SEVERE (8-10): excruciating pain and patient unable to do any normal activities        none 4. RECURRENT SYMPTOM: "Have you ever had sinus problems before?" If Yes, ask: "When was the last time?" and "What happened that time?"      yes 5. NASAL CONGESTION: "Is the nose blocked?"  If Yes, ask: "Can you open it or must you breathe through your mouth?"     yes 6. NASAL DISCHARGE: "Do you have discharge from your nose?" If so ask, "What color?"     green 7. FEVER: "Do you have a fever?" If Yes, ask: "What is it, how was it measured, and when did it start?"      none 8. OTHER SYMPTOMS: "Do you have any other symptoms?" (e.g., sore throat, cough, earache, difficulty breathing)     headache  Protocols used: Sinus Pain or Congestion-A-AH

## 2023-07-13 NOTE — Telephone Encounter (Signed)
 Left message for Patient to come in early if possible if not we will see her at 4:00.

## 2023-07-13 NOTE — Telephone Encounter (Signed)
 LM for patient

## 2023-07-13 NOTE — Assessment & Plan Note (Signed)
-  Patient presented today with rhinorrhea x 2 to 3 weeks initially with greenish phlegm and now white.  No cough, no fevers chills, no myalgias.  She initially had sinus congestion as well which is resolved now.  Patient states that her symptoms are slowly improving -On exam, patient has no sinus tenderness.  Lungs are clear to auscultation bilaterally.  No pharyngeal erythema or exudate noted. -I suspect patient likely has a viral URI which is slowly improving -No indication for antibiotics at this time -Continue conservative measures including Nettie pot, Astelin nasal spray, warm showers with steam inhalation -No further workup at this time -Return precautions given to the patient

## 2023-07-13 NOTE — Patient Instructions (Addendum)
-  It was a pleasure meeting you today -I suspect that you have a viral upper respiratory tract infection that is slowly improving -There is no indication for antibiotics at this time -Continue using the Nettie pot to help with your congestion -You can continue to use Mucinex but try not to use the DM version (take plain Mucinex) -Continue with warm shower and steam inhalation to help with your congestion -For the tingling in your lips and redness I suspect is likely secondary to an allergy since this improved with Benadryl. -If the symptoms recur would try Zyrtec instead of Benadryl as it does not make you as sleepy -Please contact with any questions or concerns or if your symptoms worsen acutely

## 2023-07-13 NOTE — Telephone Encounter (Signed)
 NOTED

## 2023-07-13 NOTE — Progress Notes (Signed)
 Acute Office Visit  Subjective:     Patient ID: Robin Hill, female    DOB: 06-09-1950, 73 y.o.   MRN: [id number]  Chief Complaint  Patient presents with   Acute Visit    Green mucus x 1 month    HPI Patient is in today for upper respiratory tract infection.  Patient states that she developed sinus congestion, nasal congestion with rhinorrhea and postnasal drip beginning approximately 2 to 3 weeks ago.  Patient states that the sinus congestion has resolved but she was having persistent rhinorrhea.  Patient states that overall she is improving and that she initially had green phlegm from her nose but now it is white.  She denied any fevers or chills.  No myalgias.  No cough.  No shortness of breath.  No wheezing.    She did complain a couple days ago of tingling and numbness over her lips which resolved with Benadryl.  She states that this has happened before when she was eating wings from a [employer] and had an allergy to MSG.  She does state that the Benadryl made her drowsy and she slept all day.  Review of Systems  Constitutional: Negative.   HENT:  Positive for congestion. Negative for ear discharge, ear pain, sinus pain and sore throat.        Patient did complain of tingling and numbness over her lips yesterday which is since resolved with Benadryl  Respiratory: Negative.  Negative for cough, shortness of breath and wheezing.   Cardiovascular: Negative.   Musculoskeletal: Negative.  Negative for myalgias.  Neurological:  Negative for headaches.  Psychiatric/Behavioral: Negative.          Objective:    BP 132/84   Pulse 85   Temp 98 F (36.7 C)   Ht 5\' 4"  (1.626 m)   Wt 212 lb 3.2 oz (96.3 kg)   SpO2 98%   BMI 36.42 kg/m    Physical Exam Constitutional:      Appearance: Normal appearance.  HENT:     Head: Normocephalic and atraumatic.     Comments: No redness or lesions noted over her lips.  Sensation intact to touch    Nose: Nose normal. No  congestion or rhinorrhea.     Right Sinus: No maxillary sinus tenderness or frontal sinus tenderness.     Left Sinus: No maxillary sinus tenderness or frontal sinus tenderness.     Mouth/Throat:     Mouth: Mucous membranes are moist.     Pharynx: Oropharynx is clear. No oropharyngeal exudate or posterior oropharyngeal erythema.  Cardiovascular:     Rate and Rhythm: Normal rate and regular rhythm.     Heart sounds: Normal heart sounds.  Pulmonary:     Effort: Pulmonary effort is normal.     Breath sounds: Normal breath sounds. No wheezing, rhonchi or rales.  Neurological:     Mental Status: She is alert.  Psychiatric:        Mood and Affect: Mood normal.        Behavior: Behavior normal.     No results found for any visits on 07/13/23.      Assessment & Plan:   Problem List Items Addressed This Visit       Respiratory   URI (upper respiratory infection) - Primary   -Patient presented today with rhinorrhea x 2 to 3 weeks initially with greenish phlegm and now white.  No cough, no fevers chills, no myalgias.  She initially had sinus  congestion as well which is resolved now.  Patient states that her symptoms are slowly improving -On exam, patient has no sinus tenderness.  Lungs are clear to auscultation bilaterally.  No pharyngeal erythema or exudate noted. -I suspect patient likely has a viral URI which is slowly improving -No indication for antibiotics at this time -Continue conservative measures including Nettie pot, Astelin nasal spray, warm showers with steam inhalation -No further workup at this time -Return precautions given to the patient        Other   Oral allergy syndrome   -Patient states that yesterday she developed tingling and numbness over her lips and thought that it was secondary to an allergy.  This resolved with taking Benadryl. -Patient states that she has similar symptoms when she had a wings at a [employer] and believes this is secondary to  MSG -I suspect that patient likely has an oral allergy syndrome which is consistent with her symptoms of tingling and numbness of her lips.  She she has no swelling or any symptoms concerning for anaphylaxis -This is usually caused by a cross reaction between pollen allergens and raw fruits or vegetables or nuts -This appears to be an isolated incident and no further workup is required -I did advise her to use Zyrtec instead of Benadryl when she gets the symptoms again to see if she can get the antihistamine effect without the drowsiness caused by Benadryl -No further workup at this time       No orders of the defined types were placed in this encounter.   No follow-ups on file.  Earl Lagos, MD

## 2023-07-26 DIAGNOSIS — I10 Essential (primary) hypertension: Secondary | ICD-10-CM | POA: Diagnosis not present

## 2023-07-26 DIAGNOSIS — H2513 Age-related nuclear cataract, bilateral: Secondary | ICD-10-CM | POA: Diagnosis not present

## 2023-09-02 ENCOUNTER — Ambulatory Visit (INDEPENDENT_AMBULATORY_CARE_PROVIDER_SITE_OTHER): Payer: Medicare Other | Admitting: Internal Medicine

## 2023-09-02 ENCOUNTER — Encounter: Payer: Self-pay | Admitting: Internal Medicine

## 2023-09-02 VITALS — BP 118/68 | HR 76 | Temp 98.0°F | Resp 16 | Ht 64.0 in | Wt 208.6 lb

## 2023-09-02 DIAGNOSIS — E785 Hyperlipidemia, unspecified: Secondary | ICD-10-CM | POA: Diagnosis not present

## 2023-09-02 DIAGNOSIS — Z1231 Encounter for screening mammogram for malignant neoplasm of breast: Secondary | ICD-10-CM

## 2023-09-02 DIAGNOSIS — Z9109 Other allergy status, other than to drugs and biological substances: Secondary | ICD-10-CM

## 2023-09-02 DIAGNOSIS — K219 Gastro-esophageal reflux disease without esophagitis: Secondary | ICD-10-CM | POA: Diagnosis not present

## 2023-09-02 DIAGNOSIS — J4521 Mild intermittent asthma with (acute) exacerbation: Secondary | ICD-10-CM

## 2023-09-02 DIAGNOSIS — T451X5A Adverse effect of antineoplastic and immunosuppressive drugs, initial encounter: Secondary | ICD-10-CM | POA: Diagnosis not present

## 2023-09-02 DIAGNOSIS — R739 Hyperglycemia, unspecified: Secondary | ICD-10-CM | POA: Diagnosis not present

## 2023-09-02 DIAGNOSIS — Z853 Personal history of malignant neoplasm of breast: Secondary | ICD-10-CM | POA: Diagnosis not present

## 2023-09-02 DIAGNOSIS — G620 Drug-induced polyneuropathy: Secondary | ICD-10-CM | POA: Diagnosis not present

## 2023-09-02 LAB — LIPID PANEL
Cholesterol: 167 mg/dL (ref 0–200)
HDL: 66 mg/dL (ref >39.00)
LDL Cholesterol: 87 mg/dL (ref 0–99)
NonHDL: 100.71
Total CHOL/HDL Ratio: 3
Triglycerides: 71 mg/dL (ref 0.0–149.0)
VLDL: 14.2 mg/dL (ref 0.0–40.0)

## 2023-09-02 LAB — HEPATIC FUNCTION PANEL
ALT: 22 U/L (ref 0–35)
AST: 28 U/L (ref 0–37)
Albumin: 4.3 g/dL (ref 3.5–5.2)
Alkaline Phosphatase: 54 U/L (ref 39–117)
Bilirubin, Direct: 0.1 mg/dL (ref 0.0–0.3)
Total Bilirubin: 0.7 mg/dL (ref 0.2–1.2)
Total Protein: 7.3 g/dL (ref 6.0–8.3)

## 2023-09-02 LAB — BASIC METABOLIC PANEL WITH GFR
BUN: 16 mg/dL (ref 6–23)
CO2: 30 meq/L (ref 19–32)
Calcium: 9.6 mg/dL (ref 8.4–10.5)
Chloride: 103 meq/L (ref 96–112)
Creatinine, Ser: 0.81 mg/dL (ref 0.40–1.20)
GFR: 71.97 mL/min (ref >60.00)
Glucose, Bld: 102 mg/dL — ABNORMAL HIGH (ref 70–99)
Potassium: 3.9 meq/L (ref 3.5–5.1)
Sodium: 139 meq/L (ref 135–145)

## 2023-09-02 LAB — HEMOGLOBIN A1C: Hgb A1c MFr Bld: 6.3 % (ref 4.6–6.5)

## 2023-09-02 NOTE — Patient Instructions (Signed)
 Take omeprazole  - 10mg  - take 2 tablets - one time per day.   Continue zyrtec daily.   Nasacort  nasal spray - 2 sprays each nostril one time per day. Do this in the evening.   Astelin  nasal spray - 1 spray each nostril 2x/day

## 2023-09-02 NOTE — Progress Notes (Unsigned)
 Subjective:    Patient ID: Robin Hill, female    DOB: [birth year], 73 y.o.   MRN: [id number]  Patient here for  Chief Complaint  Patient presents with   Medical Management of Chronic Issues    HPI Here for a scheduled follow up -  f/u regarding hyperglycemia, hypercholesterolemia and GERD. Also, history of breast cancer s/p left breast mastectomy. 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 Continues on symbicort  and singulair  for mild asthma. Reports having some increased nasal congestion - dripping and increased drainage. No fever. No chest congestion or sob reported. Taking zyrtec. Discussed taking daily. Stop afrin nasal spray. Continue astelin  daily as directed and nasacort . Also with acid reflux. May be aggravating symtoms. No vomiting. No abdominal pain or bowel change reported.    Past Medical History:  Diagnosis Date   Allergic rhinitis    Allergy    Asthma    Breast cancer (HCC) 2012   mastectomy with chemo and rad tx   Diverticulosis    Dysmenorrhea    Hypercholesterolemia    Past Surgical History:  Procedure Laterality Date   BREAST BIOPSY Right 2012   benign   BREAST SURGERY  2011   breast cancer   CESAREAN SECTION  1970   CESAREAN SECTION  1973   CESAREAN SECTION  1987   COLONOSCOPY WITH PROPOFOL  N/A 10/03/2015   Procedure: COLONOSCOPY WITH PROPOFOL ;  Surgeon: Cassie Click, MD;  Location: [facility] ENDOSCOPY;  Service: Endoscopy;  Laterality: N/A;   MASTECTOMY Left 2012   with chemo and rad tx   NASAL SINUS SURGERY  07/2001   Family History  Problem Relation Age of Onset   Cirrhosis Brother        liver   Breast cancer Paternal Aunt        2 aunts   Social History   Socioeconomic History   Marital status: Divorced    Spouse name: Not on file   Number of children: Not on file   Years of education: Not on file   Highest education level: Not on file  Occupational History   Not on file  Tobacco Use   Smoking status: Former    Types:  Cigarettes   Smokeless tobacco: Never  Vaping Use   Vaping status: Never Used  Substance and Sexual Activity   Alcohol use: Yes    Alcohol/week: 1.0 standard drink of alcohol    Types: 1 Glasses of wine per week    Comment: OCC   Drug use: No   Sexual activity: Never  Other Topics Concern   Not on file  Social History Narrative   Not on file   Social Drivers of Health   Financial Resource Strain: Low Risk  (02/18/2023)   Overall Financial Resource Strain (CARDIA)    Difficulty of Paying Living Expenses: Not hard at all  Food Insecurity: No Food Insecurity (02/18/2023)   Hunger Vital Sign    Worried About Running Out of Food in the Last Year: Never true    Ran Out of Food in the Last Year: Never true  Transportation Needs: No Transportation Needs (02/18/2023)   PRAPARE - Administrator, Civil Service (Medical): No    Lack of Transportation (Non-Medical): No  Physical Activity: Sufficiently Active (02/18/2023)   Exercise Vital Sign    Days of Exercise per Week: 3 days    Minutes of Exercise per Session: 60 min  Stress: No Stress Concern Present (02/18/2023)   [employer]  of Occupational Health - Occupational Stress Questionnaire    Feeling of Stress : Not at all  Social Connections: Moderately Integrated (02/18/2023)   Social Connection and Isolation Panel [NHANES]    Frequency of Communication with Friends and Family: More than three times a week    Frequency of Social Gatherings with Friends and Family: More than three times a week    Attends Religious Services: More than 4 times per year    Active Member of [employer] or Organizations: Yes    Attends Engineer, structural: More than 4 times per year    Marital Status: Divorced     Review of Systems  Constitutional:  Negative for appetite change and unexpected weight change.  HENT:  Positive for congestion and postnasal drip.   Respiratory:  Negative for cough, chest tightness and shortness of  breath.   Cardiovascular:  Negative for chest pain, palpitations and leg swelling.  Gastrointestinal:  Negative for abdominal pain, diarrhea, nausea and vomiting.  Genitourinary:  Negative for difficulty urinating and dysuria.  Musculoskeletal:  Negative for joint swelling and myalgias.  Skin:  Negative for color change and rash.  Neurological:  Negative for dizziness and headaches.  Psychiatric/Behavioral:  Negative for agitation and dysphoric mood.        Objective:     BP 118/68   Pulse 76   Temp 98 F (36.7 C)   Resp 16   Ht 5\' 4"  (1.626 m)   Wt 208 lb 9.6 oz (94.6 kg)   SpO2 98%   BMI 35.81 kg/m  Wt Readings from Last 3 Encounters:  09/02/23 208 lb 9.6 oz (94.6 kg)  07/13/23 212 lb 3.2 oz (96.3 kg)  05/05/23 211 lb (95.7 kg)    Physical Exam Vitals reviewed.  Constitutional:      General: She is not in acute distress.    Appearance: Normal appearance.  HENT:     Head: Normocephalic and atraumatic.     Right Ear: External ear normal.     Left Ear: External ear normal.     Mouth/Throat:     Pharynx: No oropharyngeal exudate or posterior oropharyngeal erythema.  Eyes:     General: No scleral icterus.       Right eye: No discharge.        Left eye: No discharge.     Conjunctiva/sclera: Conjunctivae normal.  Neck:     Thyroid : No thyromegaly.  Cardiovascular:     Rate and Rhythm: Normal rate and regular rhythm.  Pulmonary:     Effort: No respiratory distress.     Breath sounds: Normal breath sounds. No wheezing.  Abdominal:     General: Bowel sounds are normal.     Palpations: Abdomen is soft.     Tenderness: There is no abdominal tenderness.  Musculoskeletal:        General: No swelling or tenderness.     Cervical back: Neck supple. No tenderness.  Lymphadenopathy:     Cervical: No cervical adenopathy.  Skin:    Findings: No erythema or rash.  Neurological:     Mental Status: She is alert.  Psychiatric:        Mood and Affect: Mood normal.         Behavior: Behavior normal.         Outpatient Encounter Medications as of 09/02/2023  Medication Sig   cetirizine (ZYRTEC) 10 MG tablet Take 10 mg by mouth daily.   oxymetazoline (AFRIN) 0.05 % nasal spray Place 1  spray into both nostrils 2 (two) times daily.   azelastine  (ASTELIN ) 0.1 % nasal spray Place 1 spray into both nostrils 2 (two) times daily. Use in each nostril as directed   BIOTIN 5000 PO Take 500 mg by mouth daily.   budesonide -formoterol  (SYMBICORT ) 160-4.5 MCG/ACT inhaler USE 2 INHALATIONS BY MOUTH  TWICE DAILY   CALCIUM  CITRATE PO Take by mouth.   cholecalciferol (VITAMIN D) 400 UNITS TABS Take 400 Units by mouth daily. Chewable   glucosamine-chondroitin 500-400 MG tablet Take 1 tablet by mouth 3 (three) times daily.   Multiple Vitamins-Minerals (CENTRUM SILVER 50+WOMEN) TABS Take by mouth.   Omega-3 Fatty Acids (FISH OIL PO) Take 1 capsule by mouth daily.   omeprazole  (PRILOSEC) 10 MG capsule TAKE 1 CAPSULE BY MOUTH DAILY   pregabalin  (LYRICA ) 100 MG capsule Take 1 capsule (100 mg total) by mouth 2 (two) times daily.   rosuvastatin  (CRESTOR ) 40 MG tablet TAKE 1 TABLET BY MOUTH DAILY   vitamin B-12 (CYANOCOBALAMIN ) 1000 MCG tablet Take 1,000 mcg by mouth daily.   vitamin E 400 UNIT capsule Take 400 Units by mouth daily.   Zinc 50 MG TABS Take by mouth.   No facility-administered encounter medications on file as of 09/02/2023.     Lab Results  Component Value Date   WBC 6.2 12/28/2022   HGB 13.0 12/28/2022   HCT 39.7 12/28/2022   PLT 191.0 12/28/2022   GLUCOSE 102 (H) 09/02/2023   CHOL 167 09/02/2023   TRIG 71.0 09/02/2023   HDL 66.00 09/02/2023   LDLDIRECT 126.2 05/09/2013   LDLCALC 87 09/02/2023   ALT 22 09/02/2023   AST 28 09/02/2023   NA 139 09/02/2023   K 3.9 09/02/2023   CL 103 09/02/2023   CREATININE 0.81 09/02/2023   BUN 16 09/02/2023   CO2 30 09/02/2023   TSH 1.64 12/28/2022   HGBA1C 6.3 09/02/2023   MICROALBUR <0.7 09/09/2014    MM 3D  SCREENING MAMMOGRAM UNILATERAL RIGHT BREAST Result Date: 12/27/2022 CLINICAL DATA:  Screening. EXAM: DIGITAL SCREENING UNILATERAL RIGHT MAMMOGRAM WITH CAD AND TOMOSYNTHESIS TECHNIQUE: Right screening digital craniocaudal and mediolateral oblique mammograms were obtained. Right screening digital breast tomosynthesis was performed. The images were evaluated with computer-aided detection. COMPARISON:  Previous exam(s). ACR Breast Density Category b: There are scattered areas of fibroglandular density. FINDINGS: There are no findings suspicious for malignancy. IMPRESSION: No mammographic evidence of malignancy. A result letter of this screening mammogram will be mailed directly to the patient. RECOMMENDATION: Screening mammogram in one year. (Code:SM-B-01Y) BI-RADS CATEGORY  1: Negative. Electronically Signed   By: Anna Barnes M.D.   On: 12/27/2022 15:38       Assessment & Plan:  Hyperlipidemia, unspecified hyperlipidemia type Assessment & Plan: Continue crestor .  Low cholesterol diet and exercise.  Follow lipid panel and liver function tests.  No changes in medication today.  Lab Results  Component Value Date   CHOL 167 09/02/2023   HDL 66.00 09/02/2023   LDLCALC 87 09/02/2023   LDLDIRECT 126.2 05/09/2013   TRIG 71.0 09/02/2023   CHOLHDL 3 09/02/2023    Orders: -     Basic metabolic panel with GFR -     Hepatic function panel -     Lipid panel  Hyperglycemia Assessment & Plan: Low carb diet and exercise. Follow met b and A1c.   Lab Results  Component Value Date   HGBA1C 6.3 09/02/2023     Orders: -     Basic metabolic panel with GFR -  Hemoglobin A1c  Visit for screening mammogram -     3D Screening Mammogram, Left and Right; Future  Mild intermittent asthma with acute exacerbation Assessment & Plan: Continues on symbicort  and singulair . Treat current allergy symptoms. Zyrtec daily. Astelin  nasal spray and nasacort  nasal spray as directed.  Stop afrin. Treat acid reflux.  Follow    Chemotherapy-induced neuropathy (HCC) Assessment & Plan: Continue lyrica . Stable.    Environmental allergies Assessment & Plan: Continue singulair . Symptoms as outlined. Treat with zyrtec daily. Stop afrin nasal spray. Steroid nasal spray and astelin  nasal spray as directed. Follow.    History of breast cancer Assessment & Plan: Completed 10 years of aromatase inhibitor.  Mammogram 12/24/22 - Birads I.  Continue yearly mammograms.  Has been released by oncology.    Gastroesophageal reflux disease without esophagitis Assessment & Plan: Acid relux as outlined. Will increase omeprazole  to 10mg  - take two tablets q day. Call with update.       Dellar Fenton, MD

## 2023-09-04 ENCOUNTER — Encounter: Payer: Self-pay | Admitting: Internal Medicine

## 2023-09-04 NOTE — Assessment & Plan Note (Signed)
 Continue singulair . Symptoms as outlined. Treat with zyrtec daily. Stop afrin nasal spray. Steroid nasal spray and astelin  nasal spray as directed. Follow.

## 2023-09-04 NOTE — Assessment & Plan Note (Signed)
 Low carb diet and exercise. Follow met b and A1c.   Lab Results  Component Value Date   HGBA1C 6.3 09/02/2023

## 2023-09-04 NOTE — Assessment & Plan Note (Signed)
 Acid relux as outlined. Will increase omeprazole  to 10mg  - take two tablets q day. Call with update.

## 2023-09-04 NOTE — Assessment & Plan Note (Signed)
 Completed 10 years of aromatase inhibitor.  Mammogram 12/24/22 - Birads I.  Continue yearly mammograms.  Has been released by oncology.

## 2023-09-04 NOTE — Assessment & Plan Note (Signed)
 Continues on symbicort  and singulair . Treat current allergy symptoms. Zyrtec daily. Astelin  nasal spray and nasacort  nasal spray as directed.  Stop afrin. Treat acid reflux. Follow

## 2023-09-04 NOTE — Assessment & Plan Note (Signed)
 Continue lyrica . Stable.

## 2023-09-04 NOTE — Assessment & Plan Note (Signed)
 Continue crestor .  Low cholesterol diet and exercise.  Follow lipid panel and liver function tests.  No changes in medication today.  Lab Results  Component Value Date   CHOL 167 09/02/2023   HDL 66.00 09/02/2023   LDLCALC 87 09/02/2023   LDLDIRECT 126.2 05/09/2013   TRIG 71.0 09/02/2023   CHOLHDL 3 09/02/2023

## 2023-10-17 ENCOUNTER — Other Ambulatory Visit: Payer: Self-pay | Admitting: Internal Medicine

## 2023-10-17 DIAGNOSIS — G629 Polyneuropathy, unspecified: Secondary | ICD-10-CM

## 2023-12-26 ENCOUNTER — Other Ambulatory Visit: Payer: Self-pay | Admitting: Internal Medicine

## 2023-12-30 ENCOUNTER — Ambulatory Visit
Admission: RE | Admit: 2023-12-30 | Discharge: 2023-12-30 | Disposition: A | Discharge status: Home / Self Care | Source: Ambulatory Visit | Attending: Internal Medicine | Admitting: Internal Medicine

## 2023-12-30 DIAGNOSIS — Z1231 Encounter for screening mammogram for malignant neoplasm of breast: Secondary | ICD-10-CM | POA: Diagnosis present

## 2024-01-06 ENCOUNTER — Telehealth: Payer: Self-pay | Admitting: Internal Medicine

## 2024-01-06 NOTE — Telephone Encounter (Unsigned)
 Copied from CRM [id number]. Topic: Appointments - Scheduling Inquiry for Clinic >> Jan 06, 2024  2:58 PM Sasha M wrote: Reason for CRM: Pt called in confused due to a msg she received stating that she needs a physical at the beginning of the year when she has not had one this year at all. She has a Medicare AWV in oct but says she hasn't had a physical with her pcp at all this year, last one I see is in April 2024. Please call either number listed to verify this information so pt can schedule appropriately.

## 2024-01-06 NOTE — Telephone Encounter (Signed)
 Left message for patient to call office and schedule a physical after the new year.

## 2024-01-06 NOTE — Telephone Encounter (Signed)
 Patient due for CPE now. Patient has been scheduled for CPE end of month.

## 2024-01-24 ENCOUNTER — Ambulatory Visit: Admitting: Internal Medicine

## 2024-01-24 VITALS — BP 128/72 | HR 76 | Resp 16 | Ht 64.0 in | Wt 207.2 lb

## 2024-01-24 DIAGNOSIS — T451X5A Adverse effect of antineoplastic and immunosuppressive drugs, initial encounter: Secondary | ICD-10-CM

## 2024-01-24 DIAGNOSIS — E785 Hyperlipidemia, unspecified: Secondary | ICD-10-CM | POA: Diagnosis not present

## 2024-01-24 DIAGNOSIS — Z0000 Encounter for general adult medical examination without abnormal findings: Secondary | ICD-10-CM

## 2024-01-24 DIAGNOSIS — G620 Drug-induced polyneuropathy: Secondary | ICD-10-CM

## 2024-01-24 DIAGNOSIS — G629 Polyneuropathy, unspecified: Secondary | ICD-10-CM

## 2024-01-24 DIAGNOSIS — J4521 Mild intermittent asthma with (acute) exacerbation: Secondary | ICD-10-CM

## 2024-01-24 DIAGNOSIS — R739 Hyperglycemia, unspecified: Secondary | ICD-10-CM

## 2024-01-24 DIAGNOSIS — K219 Gastro-esophageal reflux disease without esophagitis: Secondary | ICD-10-CM

## 2024-01-24 DIAGNOSIS — M21619 Bunion of unspecified foot: Secondary | ICD-10-CM

## 2024-01-24 DIAGNOSIS — Z853 Personal history of malignant neoplasm of breast: Secondary | ICD-10-CM

## 2024-01-24 MED ORDER — PREGABALIN 100 MG PO CAPS: 100.0000 mg | ORAL_CAPSULE | Freq: Two times a day (BID) | ORAL | 1 refills | Status: DC

## 2024-01-24 NOTE — Progress Notes (Signed)
 Subjective:    Patient ID: Robin Hill, female    DOB: 1951-01-17, 73 y.o.   MRN: [id number]  Patient here for  Chief Complaint  Patient presents with   Annual Exam    HPI Here for a physical exam. History of breast cancer s/p left breast mastectomy. 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 Continues on symbicort  and singulair  for mild asthma. 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 Controls symptoms. Continues on symbicort  and singulair  for mild asthma. Last visit - omeprazole  was increased to 20mg  dialy. Upper symptoms controlled. Stays active. No chest pain. Breathing stable. No increased cough or congestion. No abdominal pain or bowel change reported. 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 Has bunions. No pain. Third finger - second DIP - arthritis. No increased pain.    Past Medical History:  Diagnosis Date   Allergic rhinitis    Allergy    Asthma    Breast cancer (HCC) 2012   mastectomy with chemo and rad tx   Diverticulosis    Dysmenorrhea    Hypercholesterolemia    Past Surgical History:  Procedure Laterality Date   BREAST BIOPSY Right 2012   benign   BREAST SURGERY  2011   breast cancer   CESAREAN SECTION  1970   CESAREAN SECTION  1973   CESAREAN SECTION  1987   COLONOSCOPY WITH PROPOFOL  N/A 10/03/2015   Procedure: COLONOSCOPY WITH PROPOFOL ;  Surgeon: Lamar ONEIDA Holmes, MD;  Location: [facility] ENDOSCOPY;  Service: Endoscopy;  Laterality: N/A;   MASTECTOMY Left 2012   with chemo and rad tx   NASAL SINUS SURGERY  07/2001   Family History  Problem Relation Age of Onset   Cirrhosis Brother        liver   Breast cancer Paternal Aunt        2 aunts   Social History   Socioeconomic History   Marital status: Divorced    Spouse name: Not on file   Number of children: Not on file   Years of education: Not on file   Highest education level: Not on file  Occupational History   Not on file  Tobacco Use   Smoking status:  Former    Types: Cigarettes   Smokeless tobacco: Never  Vaping Use   Vaping status: Never Used  Substance and Sexual Activity   Alcohol use: Yes    Alcohol/week: 1.0 standard drink of alcohol    Types: 1 Glasses of wine per week    Comment: OCC   Drug use: No   Sexual activity: Never  Other Topics Concern   Not on file  Social History Narrative   Not on file   Social Drivers of Health   Financial Resource Strain: Low Risk  (02/18/2023)   Overall Financial Resource Strain (CARDIA)    Difficulty of Paying Living Expenses: Not hard at all  Food Insecurity: No Food Insecurity (02/18/2023)   Hunger Vital Sign    Worried About Running Out of Food in the Last Year: Never true    Ran Out of Food in the Last Year: Never true  Transportation Needs: No Transportation Needs (02/18/2023)   PRAPARE - Administrator, Civil Service (Medical): No    Lack of Transportation (Non-Medical): No  Physical Activity: Sufficiently Active (02/18/2023)   Exercise Vital Sign    Days of Exercise per Week: 3 days    Minutes of Exercise per Session: 60 min  Stress:  No Stress Concern Present (02/18/2023)   [employer] of Occupational Health - Occupational Stress Questionnaire    Feeling of Stress : Not at all  Social Connections: Moderately Integrated (02/18/2023)   Social Connection and Isolation Panel    Frequency of Communication with Friends and Family: More than three times a week    Frequency of Social Gatherings with Friends and Family: More than three times a week    Attends Religious Services: More than 4 times per year    Active Member of [employer] or Organizations: Yes    Attends Engineer, structural: More than 4 times per year    Marital Status: Divorced     Review of Systems  Constitutional:  Negative for appetite change and unexpected weight change.  HENT:  Negative for congestion, sinus pressure and sore throat.   Eyes:  Negative for pain and visual disturbance.   Respiratory:  Negative for cough, chest tightness and shortness of breath.   Cardiovascular:  Negative for chest pain, palpitations and leg swelling.  Gastrointestinal:  Negative for abdominal pain, diarrhea, nausea and vomiting.  Genitourinary:  Negative for difficulty urinating and dysuria.  Musculoskeletal:  Negative for myalgias.       Third DIP - arthritis.   Skin:  Negative for color change and rash.  Neurological:  Negative for dizziness and headaches.  Hematological:  Negative for adenopathy. Does not bruise/bleed easily.  Psychiatric/Behavioral:  Negative for agitation and dysphoric mood.        Objective:     BP 128/72   Pulse 76   Resp 16   Ht 5' 4 (1.626 m)   Wt 207 lb 3.2 oz (94 kg)   SpO2 98%   BMI 35.57 kg/m  Wt Readings from Last 3 Encounters:  01/24/24 207 lb 3.2 oz (94 kg)  09/02/23 208 lb 9.6 oz (94.6 kg)  07/13/23 212 lb 3.2 oz (96.3 kg)    Physical Exam Constitutional:      General: She is not in acute distress.    Appearance: Normal appearance.  HENT:     Head: Normocephalic and atraumatic.     Nose: Nose normal.     Mouth/Throat:     Pharynx: No oropharyngeal exudate or posterior oropharyngeal erythema.  Neck:     Thyroid : No thyromegaly.  Cardiovascular:     Rate and Rhythm: Normal rate and regular rhythm.  Pulmonary:     Effort: No respiratory distress.     Breath sounds: Normal breath sounds. No wheezing.     Comments: Breast - right breast no nipple discharge or nipple retraction present. Could not appreciate distinct nodules or axillary adenopathy. S/p left breast mastectomy  Abdominal:     General: Bowel sounds are normal.     Palpations: Abdomen is soft.     Tenderness: There is no abdominal tenderness.  Musculoskeletal:        General: No swelling or tenderness.     Cervical back: Neck supple. No tenderness.  Lymphadenopathy:     Cervical: No cervical adenopathy.  Skin:    Findings: No erythema or rash.  Neurological:      Mental Status: She is alert.  Psychiatric:        Mood and Affect: Mood normal.        Behavior: Behavior normal.         Outpatient Encounter Medications as of 01/24/2024  Medication Sig   azelastine  (ASTELIN ) 0.1 % nasal spray Place 1 spray into both nostrils 2 (  two) times daily. Use in each nostril as directed   BIOTIN 5000 PO Take 500 mg by mouth daily.   budesonide -formoterol  (SYMBICORT ) 160-4.5 MCG/ACT inhaler USE 2 INHALATIONS BY MOUTH  TWICE DAILY   CALCIUM  CITRATE PO Take by mouth.   cetirizine (ZYRTEC) 10 MG tablet Take 10 mg by mouth daily.   cholecalciferol (VITAMIN D) 400 UNITS TABS Take 400 Units by mouth daily. Chewable   glucosamine-chondroitin 500-400 MG tablet Take 1 tablet by mouth 3 (three) times daily.   Multiple Vitamins-Minerals (CENTRUM SILVER 50+WOMEN) TABS Take by mouth.   Omega-3 Fatty Acids (FISH OIL PO) Take 1 capsule by mouth daily.   omeprazole  (PRILOSEC) 10 MG capsule TAKE 1 CAPSULE BY MOUTH DAILY   oxymetazoline (AFRIN) 0.05 % nasal spray Place 1 spray into both nostrils 2 (two) times daily.   rosuvastatin  (CRESTOR ) 40 MG tablet TAKE 1 TABLET BY MOUTH DAILY   vitamin B-12 (CYANOCOBALAMIN ) 1000 MCG tablet Take 1,000 mcg by mouth daily.   vitamin E 400 UNIT capsule Take 400 Units by mouth daily.   Zinc 50 MG TABS Take by mouth.   [DISCONTINUED] pregabalin  (LYRICA ) 100 MG capsule TAKE 1 CAPSULE BY MOUTH TWICE  DAILY   pregabalin  (LYRICA ) 100 MG capsule Take 1 capsule (100 mg total) by mouth 2 (two) times daily.   No facility-administered encounter medications on file as of 01/24/2024.     Lab Results  Component Value Date   WBC 6.2 01/24/2024   HGB 12.5 01/24/2024   HCT 37.5 01/24/2024   PLT 171.0 01/24/2024   GLUCOSE 93 01/24/2024   CHOL 161 01/24/2024   TRIG 92.0 01/24/2024   HDL 68.80 01/24/2024   LDLDIRECT 126.2 05/09/2013   LDLCALC 74 01/24/2024   ALT 21 01/24/2024   AST 27 01/24/2024   NA 141 01/24/2024   K 3.8 01/24/2024   CL 104  01/24/2024   CREATININE 0.80 01/24/2024   BUN 11 01/24/2024   CO2 30 01/24/2024   TSH 1.22 01/24/2024   HGBA1C 6.6 (H) 01/24/2024    MM 3D SCREENING MAMMOGRAM UNILATERAL RIGHT BREAST Result Date: 01/05/2024 CLINICAL DATA:  Screening. EXAM: DIGITAL SCREENING UNILATERAL RIGHT MAMMOGRAM WITH CAD AND TOMOSYNTHESIS TECHNIQUE: Right screening digital craniocaudal and mediolateral oblique mammograms were obtained. Right screening digital breast tomosynthesis was performed. The images were evaluated with computer-aided detection. COMPARISON:  Previous exam(s). ACR Breast Density Category b: There are scattered areas of fibroglandular density. FINDINGS: There are no findings suspicious for malignancy. IMPRESSION: No mammographic evidence of malignancy. A result letter of this screening mammogram will be mailed directly to the patient. RECOMMENDATION: Screening mammogram in one year. (Code:SM-B-01Y) BI-RADS CATEGORY  1: Negative. Electronically Signed   By: Norleen Croak M.D.   On: 01/05/2024 13:09       Assessment & Plan:  Health care maintenance Assessment & Plan: Physical today 01/24/24.  PAP 08/2017.  Right breast mammogram 12/30/23 - Birads I. S/p left mastectomy. Colonoscopy 2017 - recommended f/u in 10 years.     Hyperlipidemia, unspecified hyperlipidemia type Assessment & Plan: Continue crestor .  Low cholesterol diet and exercise.  Follow lipid panel and liver function tests.  No changes in medication today.  Lab Results  Component Value Date   CHOL 161 01/24/2024   HDL 68.80 01/24/2024   LDLCALC 74 01/24/2024   LDLDIRECT 126.2 05/09/2013   TRIG 92.0 01/24/2024   CHOLHDL 2 01/24/2024    Orders: -     TSH -     Lipid panel -  Hepatic function panel -     Basic metabolic panel with GFR -     CBC with Differential/Platelet  Hyperglycemia Assessment & Plan: Low carb diet and exercise. Follow met b and A1c.   Lab Results  Component Value Date   HGBA1C 6.6 (H) 01/24/2024      Orders: -     Hemoglobin A1c  Neuropathy -     Pregabalin ; Take 1 capsule (100 mg total) by mouth 2 (two) times daily.  Dispense: 180 capsule; Refill: 1  Mild intermittent asthma with acute exacerbation Assessment & Plan: Continue symbicort . Breathing stable.    Chemotherapy-induced neuropathy Assessment & Plan: Continue lyrica . Stable.    Gastroesophageal reflux disease without esophagitis Assessment & Plan: Acid reflux controlled on omeprazole .    History of breast cancer Assessment & Plan: Completed 10 years of aromatase inhibitor.  Mammogram 12/30/23 - Birads I.  Continue yearly mammograms.  Has been released by oncology.    Bunion Assessment & Plan: No pain. Follow.       Allena Hamilton, MD

## 2024-01-24 NOTE — Assessment & Plan Note (Signed)
 Physical today 01/24/24.  PAP 08/2017.  Right breast mammogram 12/30/23 - Birads I. S/p left mastectomy. Colonoscopy 2017 - recommended f/u in 10 years.

## 2024-01-25 LAB — BASIC METABOLIC PANEL WITH GFR
BUN: 11 mg/dL (ref 6–23)
CO2: 30 meq/L (ref 19–32)
Calcium: 9.8 mg/dL (ref 8.4–10.5)
Chloride: 104 meq/L (ref 96–112)
Creatinine, Ser: 0.8 mg/dL (ref 0.40–1.20)
GFR: 72.84 mL/min (ref >60.00)
Glucose, Bld: 93 mg/dL (ref 70–99)
Potassium: 3.8 meq/L (ref 3.5–5.1)
Sodium: 141 meq/L (ref 135–145)

## 2024-01-25 LAB — LIPID PANEL
Cholesterol: 161 mg/dL (ref 0–200)
HDL: 68.8 mg/dL (ref >39.00)
LDL Cholesterol: 74 mg/dL (ref 0–99)
NonHDL: 92.47
Total CHOL/HDL Ratio: 2
Triglycerides: 92 mg/dL (ref 0.0–149.0)
VLDL: 18.4 mg/dL (ref 0.0–40.0)

## 2024-01-25 LAB — CBC WITH DIFFERENTIAL/PLATELET
Basophils Absolute: 0.1 K/uL (ref 0.0–0.1)
Basophils Relative: 1.6 % (ref 0.0–3.0)
Eosinophils Absolute: 0.6 K/uL (ref 0.0–0.7)
Eosinophils Relative: 9.3 % — ABNORMAL HIGH (ref 0.0–5.0)
HCT: 37.5 % (ref 36.0–46.0)
Hemoglobin: 12.5 g/dL (ref 12.0–15.0)
Lymphocytes Relative: 36.1 % (ref 12.0–46.0)
Lymphs Abs: 2.2 K/uL (ref 0.7–4.0)
MCHC: 33.5 g/dL (ref 30.0–36.0)
MCV: 89.8 fl (ref 78.0–100.0)
Monocytes Absolute: 0.4 K/uL (ref 0.1–1.0)
Monocytes Relative: 6.6 % (ref 3.0–12.0)
Neutro Abs: 2.9 K/uL (ref 1.4–7.7)
Neutrophils Relative %: 46.4 % (ref 43.0–77.0)
Platelets: 171 K/uL (ref 150.0–400.0)
RBC: 4.17 Mil/uL (ref 3.87–5.11)
RDW: 13.1 % (ref 11.5–15.5)
WBC: 6.2 K/uL (ref 4.0–10.5)

## 2024-01-25 LAB — HEPATIC FUNCTION PANEL
ALT: 21 U/L (ref 0–35)
AST: 27 U/L (ref 0–37)
Albumin: 4.4 g/dL (ref 3.5–5.2)
Alkaline Phosphatase: 54 U/L (ref 39–117)
Bilirubin, Direct: 0.2 mg/dL (ref 0.0–0.3)
Total Bilirubin: 0.7 mg/dL (ref 0.2–1.2)
Total Protein: 7.1 g/dL (ref 6.0–8.3)

## 2024-01-25 LAB — HEMOGLOBIN A1C: Hgb A1c MFr Bld: 6.6 % — ABNORMAL HIGH (ref 4.6–6.5)

## 2024-01-25 LAB — TSH: TSH: 1.22 u[IU]/mL (ref 0.35–5.50)

## 2024-01-26 ENCOUNTER — Ambulatory Visit: Payer: Self-pay | Admitting: Internal Medicine

## 2024-01-29 ENCOUNTER — Encounter: Payer: Self-pay | Admitting: Internal Medicine

## 2024-01-29 DIAGNOSIS — M21619 Bunion of unspecified foot: Secondary | ICD-10-CM | POA: Insufficient documentation

## 2024-01-29 NOTE — Assessment & Plan Note (Signed)
Continue symbicort.  Breathing stable.  

## 2024-01-29 NOTE — Assessment & Plan Note (Signed)
 Continue crestor .  Low cholesterol diet and exercise.  Follow lipid panel and liver function tests.  No changes in medication today.  Lab Results  Component Value Date   CHOL 161 01/24/2024   HDL 68.80 01/24/2024   LDLCALC 74 01/24/2024   LDLDIRECT 126.2 05/09/2013   TRIG 92.0 01/24/2024   CHOLHDL 2 01/24/2024

## 2024-01-29 NOTE — Assessment & Plan Note (Signed)
 Low carb diet and exercise. Follow met b and A1c.   Lab Results  Component Value Date   HGBA1C 6.6 (H) 01/24/2024

## 2024-01-29 NOTE — Assessment & Plan Note (Signed)
 Continue lyrica . Stable.

## 2024-01-29 NOTE — Assessment & Plan Note (Signed)
 No pain. Follow.

## 2024-01-29 NOTE — Assessment & Plan Note (Signed)
Acid reflux controlled on omeprazole.

## 2024-01-29 NOTE — Assessment & Plan Note (Addendum)
 Completed 10 years of aromatase inhibitor.  Mammogram 12/30/23 - Birads I.  Continue yearly mammograms.  Has been released by oncology.

## 2024-02-24 ENCOUNTER — Ambulatory Visit (INDEPENDENT_AMBULATORY_CARE_PROVIDER_SITE_OTHER): Payer: Medicare Other | Admitting: *Deleted

## 2024-02-24 VITALS — Ht 64.0 in | Wt 208.0 lb

## 2024-02-24 DIAGNOSIS — Z0000 Encounter for general adult medical examination without abnormal findings: Secondary | ICD-10-CM | POA: Diagnosis not present

## 2024-02-24 NOTE — Patient Instructions (Signed)
 [name],  Thank you for taking the time for your Medicare Wellness Visit. I appreciate your continued commitment to your health goals. Please review the care plan we discussed, and feel free to reach out if I can assist you further.  Medicare recommends these wellness visits once per year to help you and your care team stay ahead of potential health issues. These visits are designed to focus on prevention, allowing your provider to concentrate on managing your acute and chronic conditions during your regular appointments.  Please note that Annual Wellness Visits do not include a physical exam. Some assessments may be limited, especially if the visit was conducted virtually. If needed, we may recommend a separate in-person follow-up with your provider.  Ongoing Care Seeing your primary care provider every 3 to 6 months helps us  monitor your health and provide consistent, personalized care.  Remember to update your covid vaccine.  Referrals If a referral was made during today's visit and you haven't received any updates within two weeks, please contact the referred provider directly to check on the status.  Recommended Screenings:  Health Maintenance  Topic Date Due   COVID-19 Vaccine (8 - 2025-26 season) 01/02/2024   Breast Cancer Screening  12/29/2024   Medicare Annual Wellness Visit  02/23/2025   Colon Cancer Screening  10/02/2025   DTaP/Tdap/Td vaccine (2 - Td or Tdap) 12/08/2025   Pneumococcal Vaccine for age over 57  Completed   Flu Shot  Completed   DEXA scan (bone density measurement)  Completed   Hepatitis C Screening  Completed   Zoster (Shingles) Vaccine  Completed   Meningitis B Vaccine  Aged Out       02/24/2024   11:55 AM  Advanced Directives  Does Patient Have a Medical Advance Directive? Yes  Type of Estate agent of [place];Living will  Does patient want to make changes to medical advance directive? No - Patient declined  Copy of  Healthcare Power of Attorney in Chart? Yes - validated most recent copy scanned in chart (See row information)   Advance Care Planning is important because it: Ensures you receive medical care that aligns with your values, goals, and preferences. Provides guidance to your family and loved ones, reducing the emotional burden of decision-making during critical moments.  Vision: Annual vision screenings are recommended for early detection of glaucoma, cataracts, and diabetic retinopathy. These exams can also reveal signs of chronic conditions such as diabetes and high blood pressure.  Dental: Annual dental screenings help detect early signs of oral cancer, gum disease, and other conditions linked to overall health, including heart disease and diabetes.  Please see the attached documents for additional preventive care recommendations.

## 2024-02-24 NOTE — Progress Notes (Signed)
 Subjective:   Robin Hill is a 73 y.o. who presents for a Medicare Wellness preventive visit.  As a reminder, Annual Wellness Visits don't include a physical exam, and some assessments may be limited, especially if this visit is performed virtually. We may recommend an in-person follow-up visit with your provider if needed.  Visit Complete: Virtual I connected with  Robin Hill on 02/24/24 by a audio enabled telemedicine application and verified that I am speaking with the correct person using two identifiers.  Patient Location: Other:  at the Gym  Provider Location: Home Office  I discussed the limitations of evaluation and management by telemedicine. The patient expressed understanding and agreed to proceed.  Vital Signs: Because this visit was a virtual/telehealth visit, some criteria may be missing or patient reported. Any vitals not documented were not able to be obtained and vitals that have been documented are patient reported.  VideoDeclined- This patient declined Librarian, academic. Therefore the visit was completed with audio only.  Persons Participating in Visit: Patient.  AWV Questionnaire: No: Patient Medicare AWV questionnaire was not completed prior to this visit.  Cardiac Risk Factors include: advanced age (>63men, >66 women);dyslipidemia;obesity (BMI >30kg/m2)     Objective:    Today's Vitals   02/24/24 1133  Weight: 208 lb (94.3 kg)  Height: 5' 4 (1.626 m)   Body mass index is 35.7 kg/m.     02/24/2024   11:55 AM 02/18/2023   11:23 AM 08/14/2021    2:02 PM 12/30/2020    1:07 PM 05/13/2020    1:19 PM 12/29/2018    1:49 PM 06/05/2018    2:11 PM  Advanced Directives  Does Patient Have a Medical Advance Directive? Yes Yes Yes Yes Yes Yes No   Type of Estate agent of [place];Living will Healthcare Power of [place];Living will Healthcare Power of [place];Living will Healthcare Power of [place];Living  will Healthcare Power of [place];Living will Healthcare Power of [place];Living will   Does patient want to make changes to medical advance directive? No - Patient declined No - Patient declined No - Patient declined No - Patient declined No - Patient declined No - Patient declined   Copy of Healthcare Power of Attorney in Chart? Yes - validated most recent copy scanned in chart (See row information) Yes - validated most recent copy scanned in chart (See row information) Yes - validated most recent copy scanned in chart (See row information) Yes - validated most recent copy scanned in chart (See row information) Yes - validated most recent copy scanned in chart (See row information) No - copy requested   Would patient like information on creating a medical advance directive?      No - Patient declined No - Patient declined      Data saved with a previous flowsheet row definition    Current Medications (verified) Outpatient Encounter Medications as of 02/24/2024  Medication Sig   azelastine  (ASTELIN ) 0.1 % nasal spray Place 1 spray into both nostrils 2 (two) times daily. Use in each nostril as directed   BIOTIN 5000 PO Take 500 mg by mouth daily.   budesonide -formoterol  (SYMBICORT ) 160-4.5 MCG/ACT inhaler USE 2 INHALATIONS BY MOUTH  TWICE DAILY   CALCIUM  CITRATE PO Take by mouth.   cetirizine (ZYRTEC) 10 MG tablet Take 10 mg by mouth daily.   cholecalciferol (VITAMIN D) 400 UNITS TABS Take 400 Units by mouth daily. Chewable   glucosamine-chondroitin 500-400 MG tablet Take 1 tablet by  mouth 3 (three) times daily.   Multiple Vitamins-Minerals (CENTRUM SILVER 50+WOMEN) TABS Take by mouth.   Omega-3 Fatty Acids (FISH OIL PO) Take 1 capsule by mouth daily.   omeprazole  (PRILOSEC) 10 MG capsule TAKE 1 CAPSULE BY MOUTH DAILY   oxymetazoline (AFRIN) 0.05 % nasal spray Place 1 spray into both nostrils 2 (two) times daily.   pregabalin  (LYRICA ) 100 MG capsule Take 1 capsule (100 mg total) by mouth 2  (two) times daily.   rosuvastatin  (CRESTOR ) 40 MG tablet TAKE 1 TABLET BY MOUTH DAILY   vitamin B-12 (CYANOCOBALAMIN ) 1000 MCG tablet Take 1,000 mcg by mouth daily.   vitamin E 400 UNIT capsule Take 400 Units by mouth daily.   Zinc 50 MG TABS Take by mouth.   No facility-administered encounter medications on file as of 02/24/2024.    Allergies (verified) Patient has no known allergies.   History: Past Medical History:  Diagnosis Date   Allergic rhinitis    Allergy    Asthma    Breast cancer (HCC) 2012   mastectomy with chemo and rad tx   Diverticulosis    Dysmenorrhea    Hypercholesterolemia    Past Surgical History:  Procedure Laterality Date   BREAST BIOPSY Right 2012   benign   BREAST SURGERY  2011   breast cancer   CESAREAN SECTION  1970   CESAREAN SECTION  1973   CESAREAN SECTION  1987   COLONOSCOPY WITH PROPOFOL  N/A 10/03/2015   Procedure: COLONOSCOPY WITH PROPOFOL ;  Surgeon: Lamar ONEIDA Holmes, MD;  Location: [facility] ENDOSCOPY;  Service: Endoscopy;  Laterality: N/A;   MASTECTOMY Left 2012   with chemo and rad tx   NASAL SINUS SURGERY  07/2001   Family History  Problem Relation Age of Onset   Cirrhosis Brother        liver   Breast cancer Paternal Aunt        2 aunts   Social History   Socioeconomic History   Marital status: Divorced    Spouse name: Not on file   Number of children: Not on file   Years of education: Not on file   Highest education level: Not on file  Occupational History   Not on file  Tobacco Use   Smoking status: Former    Types: Cigarettes   Smokeless tobacco: Never  Vaping Use   Vaping status: Never Used  Substance and Sexual Activity   Alcohol use: Yes    Alcohol/week: 1.0 standard drink of alcohol    Types: 1 Glasses of wine per week    Comment: OCC   Drug use: No   Sexual activity: Never  Other Topics Concern   Not on file  Social History Narrative   Not on file   Social Drivers of Health   Financial Resource Strain:  Low Risk  (02/24/2024)   Overall Financial Resource Strain (CARDIA)    Difficulty of Paying Living Expenses: Not hard at all  Food Insecurity: No Food Insecurity (02/24/2024)   Hunger Vital Sign    Worried About Running Out of Food in the Last Year: Never true    Ran Out of Food in the Last Year: Never true  Transportation Needs: No Transportation Needs (02/24/2024)   PRAPARE - Administrator, Civil Service (Medical): No    Lack of Transportation (Non-Medical): No  Physical Activity: Sufficiently Active (02/24/2024)   Exercise Vital Sign    Days of Exercise per Week: 5 days    Minutes  of Exercise per Session: 60 min  Stress: No Stress Concern Present (02/24/2024)   [employer] of Occupational Health - Occupational Stress Questionnaire    Feeling of Stress: Not at all  Social Connections: Moderately Integrated (02/24/2024)   Social Connection and Isolation Panel    Frequency of Communication with Friends and Family: More than three times a week    Frequency of Social Gatherings with Friends and Family: More than three times a week    Attends Religious Services: More than 4 times per year    Active Member of [employer] or Organizations: Yes    Attends Engineer, structural: More than 4 times per year    Marital Status: Divorced    Tobacco Counseling Counseling given: Not Answered    Clinical Intake:  Pre-visit preparation completed: Yes  Pain : No/denies pain     BMI - recorded: 35.7 Nutritional Status: BMI > 30  Obese Nutritional Risks: None Diabetes: No  Lab Results  Component Value Date   HGBA1C 6.6 (H) 01/24/2024   HGBA1C 6.3 09/02/2023   HGBA1C 6.3 05/05/2023     How often do you need to have someone help you when you read instructions, pamphlets, or other written materials from your doctor or pharmacy?: 1 - Never  Interpreter Needed?: No  Information entered by :: R. Malyk Girouard LPN   Activities of Daily Living     02/24/2024   11:34 AM   In your present state of health, do you have any difficulty performing the following activities:  Hearing? 0  Vision? 0  Difficulty concentrating or making decisions? 0  Walking or climbing stairs? 0  Dressing or bathing? 0  Doing errands, shopping? 0  Preparing Food and eating ? N  Using the Toilet? N  In the past six months, have you accidently leaked urine? N  Do you have problems with loss of bowel control? N  Managing your Medications? N  Managing your Finances? N  Housekeeping or managing your Housekeeping? N    Patient Care Team: Glendia Shad, MD as PCP - General (Internal Medicine) Rennie Cindy SAUNDERS, MD as Consulting Physician (Hematology and Oncology)  I have updated your Care Teams any recent Medical Services you may have received from other providers in the past year.     Assessment:   This is a routine wellness examination for Robin Hill.  Hearing/Vision screen Hearing Screening - Comments:: No issues Vision Screening - Comments:: glasses   Goals Addressed             This Visit's Progress    Patient Stated       Wants to continue to stay active       Depression Screen     02/24/2024   11:38 AM 07/13/2023    3:56 PM 02/18/2023   11:19 AM 12/28/2022    1:02 PM 06/17/2022    2:22 PM 04/22/2022    2:27 PM 12/24/2021    3:01 PM  PHQ 2/9 Scores  PHQ - 2 Score 0 0 0 0 0 0 0  PHQ- 9 Score 0 0 0 0       Fall Risk     02/24/2024   11:35 AM 07/13/2023    3:56 PM 02/18/2023   11:15 AM 12/28/2022    1:01 PM 06/17/2022    2:22 PM  Fall Risk   Falls in the past year? 0 0 1 0 1  Number falls in past yr: 0 0 0 0 1  Injury  with Fall? 0 0 1 0 0  Risk for fall due to : No Fall Risks No Fall Risks History of fall(s);Impaired balance/gait No Fall Risks History of fall(s)  Follow up Falls evaluation completed;Falls prevention discussed Falls evaluation completed Falls evaluation completed;Falls prevention discussed Falls evaluation completed Falls evaluation  completed    MEDICARE RISK AT HOME:  Medicare Risk at Home Any stairs in or around the home?: Yes If so, are there any without handrails?: No Home free of loose throw rugs in walkways, pet beds, electrical cords, etc?: Yes Adequate lighting in your home to reduce risk of falls?: Yes Life alert?: No Use of a cane, walker or w/c?: No Grab bars in the bathroom?: Yes Shower chair or bench in shower?: No Elevated toilet seat or a handicapped toilet?: Yes  TIMED UP AND GO:  Was the test performed?  No  Cognitive Function: 6CIT completed    06/05/2018    2:24 PM 06/02/2017    4:30 PM  MMSE - Mini Mental State Exam  Orientation to time 5 5   Orientation to Place 5 5   Registration 3 3   Attention/ Calculation 4 5   Recall 2 3   Language- name 2 objects 2 2   Language- repeat 1 1  Language- follow 3 step command 3 3   Language- read & follow direction 1 1   Write a sentence 1 1   Copy design 1 1   Total score 28 30      Data saved with a previous flowsheet row definition        02/24/2024   11:43 AM 02/18/2023   11:23 AM 05/28/2016   11:22 AM  6CIT Screen  What Year? 0 points 0 points 0 points  What month? 0 points 0 points 0 points  What time? 0 points 0 points 0 points  Count back from 20 0 points 0 points 0 points  Months in reverse 2 points 2 points 0 points  Repeat phrase 0 points 0 points 0 points  Total Score 2 points 2 points 0 points    Immunizations Immunization History  Administered Date(s) Administered   INFLUENZA, HIGH DOSE SEASONAL PF 01/10/2018, 12/29/2018, 01/11/2023, 01/18/2024   Influenza Split 02/22/2014   Influenza Whole 02/17/2017   Influenza-Unspecified 02/24/2015, 12/09/2015, 01/02/2020   Moderna Covid-19 Fall Seasonal Vaccine 26yrs & older 01/25/2022   Moderna Covid-19 Vaccine Bivalent Booster 57yrs & up 01/29/2021   PFIZER(Purple Top)SARS-COV-2 Vaccination 06/26/2019, 07/17/2019, 02/21/2020, 09/26/2020   PNEUMOCOCCAL CONJUGATE-20  08/25/2021   Pfizer(Comirnaty)Fall Seasonal Vaccine 12 years and older 01/11/2023   Pneumococcal Conjugate-13 06/05/2016   Pneumococcal Polysaccharide-23 02/24/2015   Tdap 12/09/2015   Zoster Recombinant(Shingrix) 08/13/2016, 12/29/2018    Screening Tests Health Maintenance  Topic Date Due   COVID-19 Vaccine (8 - 2025-26 season) 01/02/2024   Mammogram  12/29/2024   Medicare Annual Wellness (AWV)  02/23/2025   Colonoscopy  10/02/2025   DTaP/Tdap/Td (2 - Td or Tdap) 12/08/2025   Pneumococcal Vaccine: 50+ Years  Completed   Influenza Vaccine  Completed   DEXA SCAN  Completed   Hepatitis C Screening  Completed   Zoster Vaccines- Shingrix  Completed   Meningococcal B Vaccine  Aged Out    Health Maintenance Items Addressed: Discussed the need to update covid vaccine.  Additional Screening:  Vision Screening: Recommended annual ophthalmology exams for early detection of glaucoma and other disorders of the eye. Is the patient up to date with their annual eye exam?  Yes  Who is the provider or what is the name of the office in which the patient attends annual eye exams? [facility]  Dental Screening: Recommended annual dental exams for proper oral hygiene  Community Resource Referral / Chronic Care Management: CRR required this visit?  No   CCM required this visit?  No   Plan:    I have personally reviewed and noted the following in the patient's chart:   Medical and social history Use of alcohol, tobacco or illicit drugs  Current medications and supplements including opioid prescriptions. Patient is not currently taking opioid prescriptions. Functional ability and status Nutritional status Physical activity Advanced directives List of other physicians Hospitalizations, surgeries, and ER visits in previous 12 months Vitals Screenings to include cognitive, depression, and falls Referrals and appointments  In addition, I have reviewed and discussed with patient certain  preventive protocols, quality metrics, and best practice recommendations. A written personalized care plan for preventive services as well as general preventive health recommendations were provided to patient.   Angeline Fredericks, LPN   [id number]   After Visit Summary: (MyChart) Due to this being a telephonic visit, the after visit summary with patients personalized plan was offered to patient via MyChart   Notes: Nothing significant to report at this time.

## 2024-03-20 ENCOUNTER — Telehealth: Payer: Self-pay

## 2024-03-20 NOTE — Telephone Encounter (Signed)
 Received paperwork from [employer]. Placed in folder for review.

## 2024-03-20 NOTE — Telephone Encounter (Signed)
 Please confirm with Ms Nabi that she ordered/requested.

## 2024-03-21 NOTE — Telephone Encounter (Signed)
 Left voicemail to return call. Okay to relay (see previous messages)

## 2024-03-22 NOTE — Telephone Encounter (Signed)
 Pt called in to say Yes she needs the paperwork signed please

## 2024-03-22 NOTE — Telephone Encounter (Signed)
 Form faxed and sent to scan

## 2024-03-22 NOTE — Telephone Encounter (Signed)
 Signed and placed in box.

## 2024-03-31 ENCOUNTER — Other Ambulatory Visit: Payer: Self-pay | Admitting: Internal Medicine

## 2024-03-31 DIAGNOSIS — E785 Hyperlipidemia, unspecified: Secondary | ICD-10-CM

## 2024-05-25 ENCOUNTER — Ambulatory Visit: Admitting: Internal Medicine

## 2024-05-30 ENCOUNTER — Ambulatory Visit: Admitting: Internal Medicine

## 2024-06-01 ENCOUNTER — Ambulatory Visit: Admitting: Internal Medicine

## 2024-06-01 ENCOUNTER — Encounter: Payer: Self-pay | Admitting: Internal Medicine

## 2024-06-01 VITALS — BP 120/70 | HR 73 | Temp 97.4°F | Ht 64.0 in | Wt 208.0 lb

## 2024-06-01 DIAGNOSIS — J4521 Mild intermittent asthma with (acute) exacerbation: Secondary | ICD-10-CM

## 2024-06-01 DIAGNOSIS — R739 Hyperglycemia, unspecified: Secondary | ICD-10-CM

## 2024-06-01 DIAGNOSIS — W19XXXA Unspecified fall, initial encounter: Secondary | ICD-10-CM

## 2024-06-01 DIAGNOSIS — K219 Gastro-esophageal reflux disease without esophagitis: Secondary | ICD-10-CM

## 2024-06-01 DIAGNOSIS — G629 Polyneuropathy, unspecified: Secondary | ICD-10-CM

## 2024-06-01 DIAGNOSIS — E785 Hyperlipidemia, unspecified: Secondary | ICD-10-CM

## 2024-06-01 DIAGNOSIS — Z853 Personal history of malignant neoplasm of breast: Secondary | ICD-10-CM

## 2024-06-01 DIAGNOSIS — G620 Drug-induced polyneuropathy: Secondary | ICD-10-CM

## 2024-06-01 MED ORDER — OMEPRAZOLE 10 MG PO CPDR: 10.0000 mg | DELAYED_RELEASE_CAPSULE | Freq: Every day | ORAL | 2 refills | Status: AC

## 2024-06-01 MED ORDER — PREGABALIN 100 MG PO CAPS: 100.0000 mg | ORAL_CAPSULE | Freq: Two times a day (BID) | ORAL | 1 refills | Status: AC

## 2024-06-01 NOTE — Progress Notes (Unsigned)
 "  Subjective:    Patient ID: Robin Hill, female    DOB: [birth year], 74 y.o.   MRN: [id number]  Patient here for  Chief Complaint  Patient presents with   Medical Management of Chronic Issues    HPI Here for a scheduled follow up. History of breast cancer s/p left breast mastectomy. 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 Continues on symbicort  and singulair  for mild asthma. On omeprazole  20mg  q day.    Past Medical History:  Diagnosis Date   Allergic rhinitis    Allergy    Asthma    Breast cancer (HCC) 2012   mastectomy with chemo and rad tx   Diverticulosis    Dysmenorrhea    Hypercholesterolemia    Past Surgical History:  Procedure Laterality Date   BREAST BIOPSY Right 2012   benign   BREAST SURGERY  2011   breast cancer   CESAREAN SECTION  1970   CESAREAN SECTION  1973   CESAREAN SECTION  1987   COLONOSCOPY WITH PROPOFOL  N/A 10/03/2015   Procedure: COLONOSCOPY WITH PROPOFOL ;  Surgeon: Lamar ONEIDA Holmes, MD;  Location: [facility] ENDOSCOPY;  Service: Endoscopy;  Laterality: N/A;   MASTECTOMY Left 2012   with chemo and rad tx   NASAL SINUS SURGERY  07/2001   Family History  Problem Relation Age of Onset   Cirrhosis Brother        liver   Breast cancer Paternal Aunt        2 aunts   Social History   Socioeconomic History   Marital status: Divorced    Spouse name: Not on file   Number of children: Not on file   Years of education: Not on file   Highest education level: Not on file  Occupational History   Not on file  Tobacco Use   Smoking status: Former    Types: Cigarettes   Smokeless tobacco: Never  Vaping Use   Vaping status: Never Used  Substance and Sexual Activity   Alcohol use: Yes    Alcohol/week: 1.0 standard drink of alcohol    Types: 1 Glasses of wine per week    Comment: OCC   Drug use: No   Sexual activity: Never  Other Topics Concern   Not on file  Social History Narrative   Not on file   Social Drivers of Health    Tobacco Use: Medium Risk (06/01/2024)   Patient History    Smoking Tobacco Use: Former    Smokeless Tobacco Use: Never    Passive Exposure: Not on file  Financial Resource Strain: Low Risk (02/24/2024)   Overall Financial Resource Strain (CARDIA)    Difficulty of Paying Living Expenses: Not hard at all  Food Insecurity: No Food Insecurity (02/24/2024)   Epic    Worried About Radiation Protection Practitioner of Food in the Last Year: Never true    Ran Out of Food in the Last Year: Never true  Transportation Needs: No Transportation Needs (02/24/2024)   Epic    Lack of Transportation (Medical): No    Lack of Transportation (Non-Medical): No  Physical Activity: Sufficiently Active (02/24/2024)   Exercise Vital Sign    Days of Exercise per Week: 5 days    Minutes of Exercise per Session: 60 min  Stress: No Stress Concern Present (02/24/2024)   [employer] of Occupational Health - Occupational Stress Questionnaire    Feeling of Stress: Not at all  Social Connections: Moderately Integrated (02/24/2024)   Social Connection and Isolation  Panel    Frequency of Communication with Friends and Family: More than three times a week    Frequency of Social Gatherings with Friends and Family: More than three times a week    Attends Religious Services: More than 4 times per year    Active Member of Clubs or Organizations: Yes    Attends Banker Meetings: More than 4 times per year    Marital Status: Divorced  Depression (PHQ2-9): Low Risk (06/01/2024)   Depression (PHQ2-9)    PHQ-2 Score: 0  Alcohol Screen: Low Risk (02/24/2024)   Alcohol Screen    Last Alcohol Screening Score (AUDIT): 1  Housing: Unknown (02/24/2024)   Epic    Unable to Pay for Housing in the Last Year: No    Number of Times Moved in the Last Year: Not on file    Homeless in the Last Year: No  Utilities: Not At Risk (02/24/2024)   Epic    Threatened with loss of utilities: No  Health Literacy: Adequate Health Literacy  (02/24/2024)   B1300 Health Literacy    Frequency of need for help with medical instructions: Never     Review of Systems     Objective:     BP 120/70   Pulse 73   Temp (!) 97.4 F (36.3 C) (Oral)   Ht 5' 4 (1.626 m)   Wt 208 lb (94.3 kg)   SpO2 95%   BMI 35.70 kg/m  Wt Readings from Last 3 Encounters:  06/01/24 208 lb (94.3 kg)  02/24/24 208 lb (94.3 kg)  01/24/24 207 lb 3.2 oz (94 kg)    Physical Exam  {Perform Simple Foot Exam  Perform Detailed exam:1} {Insert foot Exam (Optional):30965}   Outpatient Encounter Medications as of 06/01/2024  Medication Sig   azelastine  (ASTELIN ) 0.1 % nasal spray Place 1 spray into both nostrils 2 (two) times daily. Use in each nostril as directed   BIOTIN 5000 PO Take 500 mg by mouth daily.   budesonide -formoterol  (SYMBICORT ) 160-4.5 MCG/ACT inhaler USE 2 INHALATIONS BY MOUTH  TWICE DAILY   CALCIUM  CITRATE PO Take by mouth.   cetirizine (ZYRTEC) 10 MG tablet Take 10 mg by mouth daily.   cholecalciferol (VITAMIN D) 400 UNITS TABS Take 400 Units by mouth daily. Chewable   glucosamine-chondroitin 500-400 MG tablet Take 1 tablet by mouth 3 (three) times daily.   Multiple Vitamins-Minerals (CENTRUM SILVER 50+WOMEN) TABS Take by mouth.   Omega-3 Fatty Acids (FISH OIL PO) Take 1 capsule by mouth daily.   omeprazole  (PRILOSEC) 10 MG capsule TAKE 1 CAPSULE BY MOUTH DAILY   oxymetazoline (AFRIN) 0.05 % nasal spray Place 1 spray into both nostrils 2 (two) times daily.   pregabalin  (LYRICA ) 100 MG capsule Take 1 capsule (100 mg total) by mouth 2 (two) times daily.   rosuvastatin  (CRESTOR ) 40 MG tablet TAKE 1 TABLET BY MOUTH DAILY   vitamin B-12 (CYANOCOBALAMIN ) 1000 MCG tablet Take 1,000 mcg by mouth daily.   vitamin E 400 UNIT capsule Take 400 Units by mouth daily.   Zinc 50 MG TABS Take by mouth.   No facility-administered encounter medications on file as of 06/01/2024.     Lab Results  Component Value Date   WBC 6.2 01/24/2024   HGB  12.5 01/24/2024   HCT 37.5 01/24/2024   PLT 171.0 01/24/2024   GLUCOSE 93 01/24/2024   CHOL 161 01/24/2024   TRIG 92.0 01/24/2024   HDL 68.80 01/24/2024   LDLDIRECT 126.2 05/09/2013  LDLCALC 74 01/24/2024   ALT 21 01/24/2024   AST 27 01/24/2024   NA 141 01/24/2024   K 3.8 01/24/2024   CL 104 01/24/2024   CREATININE 0.80 01/24/2024   BUN 11 01/24/2024   CO2 30 01/24/2024   TSH 1.22 01/24/2024   HGBA1C 6.6 (H) 01/24/2024    MM 3D SCREENING MAMMOGRAM UNILATERAL RIGHT BREAST Result Date: 01/05/2024 CLINICAL DATA:  Screening. EXAM: DIGITAL SCREENING UNILATERAL RIGHT MAMMOGRAM WITH CAD AND TOMOSYNTHESIS TECHNIQUE: Right screening digital craniocaudal and mediolateral oblique mammograms were obtained. Right screening digital breast tomosynthesis was performed. The images were evaluated with computer-aided detection. COMPARISON:  Previous exam(s). ACR Breast Density Category b: There are scattered areas of fibroglandular density. FINDINGS: There are no findings suspicious for malignancy. IMPRESSION: No mammographic evidence of malignancy. A result letter of this screening mammogram will be mailed directly to the patient. RECOMMENDATION: Screening mammogram in one year. (Code:SM-B-01Y) BI-RADS CATEGORY  1: Negative. Electronically Signed   By: Norleen Croak M.D.   On: 01/05/2024 13:09       Assessment & Plan:  Neuropathy  Hyperlipidemia, unspecified hyperlipidemia type  Hyperglycemia     Allena Hamilton, MD "

## 2024-06-02 ENCOUNTER — Ambulatory Visit: Payer: Self-pay | Admitting: Internal Medicine

## 2024-06-02 LAB — LIPID PANEL
Chol/HDL Ratio: 2.2 ratio (ref 0.0–4.4)
Cholesterol, Total: 182 mg/dL (ref 100–199)
HDL: 83 mg/dL
LDL Chol Calc (NIH): 86 mg/dL (ref 0–99)
Triglycerides: 67 mg/dL (ref 0–149)
VLDL Cholesterol Cal: 13 mg/dL (ref 5–40)

## 2024-06-02 LAB — HEPATIC FUNCTION PANEL
ALT: 22 [IU]/L (ref 0–32)
AST: 30 [IU]/L (ref 0–40)
Albumin: 4.5 g/dL (ref 3.8–4.8)
Alkaline Phosphatase: 66 [IU]/L (ref 49–135)
Bilirubin Total: 0.5 mg/dL (ref 0.0–1.2)
Bilirubin, Direct: 0.17 mg/dL (ref 0.00–0.40)
Total Protein: 7.3 g/dL (ref 6.0–8.5)

## 2024-06-02 LAB — CBC WITH DIFFERENTIAL/PLATELET
Basophils Absolute: 0.1 10*3/uL (ref 0.0–0.2)
Basos: 1 %
EOS (ABSOLUTE): 0.4 10*3/uL (ref 0.0–0.4)
Eos: 5 %
Hematocrit: 39.8 % (ref 34.0–46.6)
Hemoglobin: 13.1 g/dL (ref 11.1–15.9)
Immature Grans (Abs): 0 10*3/uL (ref 0.0–0.1)
Immature Granulocytes: 0 %
Lymphocytes Absolute: 2.4 10*3/uL (ref 0.7–3.1)
Lymphs: 32 %
MCH: 30.7 pg (ref 26.6–33.0)
MCHC: 32.9 g/dL (ref 31.5–35.7)
MCV: 93 fL (ref 79–97)
Monocytes Absolute: 0.5 10*3/uL (ref 0.1–0.9)
Monocytes: 7 %
Neutrophils Absolute: 4 10*3/uL (ref 1.4–7.0)
Neutrophils: 55 %
Platelets: 190 10*3/uL (ref 150–450)
RBC: 4.27 x10E6/uL (ref 3.77–5.28)
RDW: 12.2 % (ref 11.7–15.4)
WBC: 7.3 10*3/uL (ref 3.4–10.8)

## 2024-06-02 LAB — BASIC METABOLIC PANEL WITH GFR
BUN/Creatinine Ratio: 17 (ref 12–28)
BUN: 14 mg/dL (ref 8–27)
CO2: 23 mmol/L (ref 20–29)
Calcium: 9.9 mg/dL (ref 8.7–10.3)
Chloride: 104 mmol/L (ref 96–106)
Creatinine, Ser: 0.84 mg/dL (ref 0.57–1.00)
Glucose: 91 mg/dL (ref 70–99)
Potassium: 4 mmol/L (ref 3.5–5.2)
Sodium: 140 mmol/L (ref 134–144)
eGFR: 73 mL/min/{1.73_m2}

## 2024-06-02 LAB — HEMOGLOBIN A1C
Est. average glucose Bld gHb Est-mCnc: 131 mg/dL
Hgb A1c MFr Bld: 6.2 % — ABNORMAL HIGH (ref 4.8–5.6)

## 2024-06-03 ENCOUNTER — Encounter: Payer: Self-pay | Admitting: Internal Medicine

## 2024-06-03 NOTE — Assessment & Plan Note (Signed)
 Continue lyrica . Stable.

## 2024-06-03 NOTE — Assessment & Plan Note (Signed)
 S/p fall as outlined. No head injury. Left hip and arm - good rom. No residual increased pain. Follow.

## 2024-06-03 NOTE — Assessment & Plan Note (Signed)
Continue symbicort.  Breathing stable.  

## 2024-06-03 NOTE — Assessment & Plan Note (Signed)
No upper symptoms reported.  Continue omeprazole.

## 2024-06-03 NOTE — Assessment & Plan Note (Signed)
 Continue crestor .  Low cholesterol diet and exercise.  Follow lipid panel.  Lab Results  Component Value Date   CHOL 182 06/01/2024   HDL 83 06/01/2024   LDLCALC 86 06/01/2024   LDLDIRECT 126.2 05/09/2013   TRIG 67 06/01/2024   CHOLHDL 2.2 06/01/2024

## 2024-06-03 NOTE — Assessment & Plan Note (Signed)
 Low carb diet and exercise. Follow met b and A1c.   Lab Results  Component Value Date   HGBA1C 6.2 (H) 06/01/2024

## 2024-06-03 NOTE — Assessment & Plan Note (Signed)
 Completed 10 years of aromatase inhibitor.  Mammogram 12/30/23 - Birads I.  Continue yearly mammograms.  Has been released by oncology.

## 2024-09-28 ENCOUNTER — Ambulatory Visit: Admitting: Internal Medicine

## 2025-02-25 ENCOUNTER — Ambulatory Visit
